# Patient Record
Sex: Female | Born: 1940 | Race: White | Hispanic: No | Marital: Married | Smoking: Never smoker
Health system: Southern US, Community
[De-identification: ages and names within clinical notes are randomized; demographics above are authoritative.]

## PROBLEM LIST (undated history)

## (undated) DIAGNOSIS — F329 Major depressive disorder, single episode, unspecified: Secondary | ICD-10-CM

## (undated) DIAGNOSIS — R42 Dizziness and giddiness: Secondary | ICD-10-CM

## (undated) DIAGNOSIS — I4891 Unspecified atrial fibrillation: Secondary | ICD-10-CM

## (undated) DIAGNOSIS — R32 Unspecified urinary incontinence: Secondary | ICD-10-CM

## (undated) DIAGNOSIS — C44301 Unspecified malignant neoplasm of skin of nose: Secondary | ICD-10-CM

## (undated) DIAGNOSIS — C50919 Malignant neoplasm of unspecified site of unspecified female breast: Secondary | ICD-10-CM

## (undated) DIAGNOSIS — S42301A Unspecified fracture of shaft of humerus, right arm, initial encounter for closed fracture: Secondary | ICD-10-CM

## (undated) DIAGNOSIS — I10 Essential (primary) hypertension: Secondary | ICD-10-CM

## (undated) DIAGNOSIS — F32A Depression, unspecified: Secondary | ICD-10-CM

## (undated) DIAGNOSIS — E785 Hyperlipidemia, unspecified: Secondary | ICD-10-CM

## (undated) HISTORY — PX: BREAST BIOPSY: SHX20

## (undated) HISTORY — DX: Malignant neoplasm of unspecified site of unspecified female breast: C50.919

## (undated) HISTORY — DX: Major depressive disorder, single episode, unspecified: F32.9

## (undated) HISTORY — DX: Essential (primary) hypertension: I10

## (undated) HISTORY — PX: BREAST LUMPECTOMY: SHX2

## (undated) HISTORY — PX: RENAL ARTERY STENT: SHX2321

## (undated) HISTORY — DX: Unspecified malignant neoplasm of skin of nose: C44.301

## (undated) HISTORY — DX: Dizziness and giddiness: R42

## (undated) HISTORY — DX: Unspecified urinary incontinence: R32

## (undated) HISTORY — DX: Depression, unspecified: F32.A

## (undated) HISTORY — PX: FEMORAL ARTERY STENT: SHX1583

## (undated) HISTORY — DX: Unspecified atrial fibrillation: I48.91

## (undated) HISTORY — DX: Hyperlipidemia, unspecified: E78.5

## (undated) HISTORY — DX: Unspecified fracture of shaft of humerus, right arm, initial encounter for closed fracture: S42.301A

---

## 2013-06-11 DIAGNOSIS — Z7901 Long term (current) use of anticoagulants: Secondary | ICD-10-CM | POA: Diagnosis not present

## 2013-06-11 DIAGNOSIS — I4891 Unspecified atrial fibrillation: Secondary | ICD-10-CM | POA: Diagnosis not present

## 2013-07-26 DIAGNOSIS — I4891 Unspecified atrial fibrillation: Secondary | ICD-10-CM | POA: Diagnosis not present

## 2013-07-26 DIAGNOSIS — Z7901 Long term (current) use of anticoagulants: Secondary | ICD-10-CM | POA: Diagnosis not present

## 2013-08-11 DIAGNOSIS — I4891 Unspecified atrial fibrillation: Secondary | ICD-10-CM | POA: Diagnosis not present

## 2013-08-11 DIAGNOSIS — Z7901 Long term (current) use of anticoagulants: Secondary | ICD-10-CM | POA: Diagnosis not present

## 2013-09-22 DIAGNOSIS — I4891 Unspecified atrial fibrillation: Secondary | ICD-10-CM | POA: Diagnosis not present

## 2013-09-22 DIAGNOSIS — Z7901 Long term (current) use of anticoagulants: Secondary | ICD-10-CM | POA: Diagnosis not present

## 2013-10-05 DIAGNOSIS — I4891 Unspecified atrial fibrillation: Secondary | ICD-10-CM | POA: Diagnosis not present

## 2013-10-05 DIAGNOSIS — Z7901 Long term (current) use of anticoagulants: Secondary | ICD-10-CM | POA: Diagnosis not present

## 2013-10-13 DIAGNOSIS — Z7901 Long term (current) use of anticoagulants: Secondary | ICD-10-CM | POA: Diagnosis not present

## 2013-10-13 DIAGNOSIS — I4891 Unspecified atrial fibrillation: Secondary | ICD-10-CM | POA: Diagnosis not present

## 2013-10-27 DIAGNOSIS — I4891 Unspecified atrial fibrillation: Secondary | ICD-10-CM | POA: Diagnosis not present

## 2013-10-27 DIAGNOSIS — Z7901 Long term (current) use of anticoagulants: Secondary | ICD-10-CM | POA: Diagnosis not present

## 2013-11-04 DIAGNOSIS — Z85828 Personal history of other malignant neoplasm of skin: Secondary | ICD-10-CM | POA: Diagnosis not present

## 2013-11-04 DIAGNOSIS — L570 Actinic keratosis: Secondary | ICD-10-CM | POA: Diagnosis not present

## 2013-11-04 DIAGNOSIS — L819 Disorder of pigmentation, unspecified: Secondary | ICD-10-CM | POA: Diagnosis not present

## 2013-12-07 DIAGNOSIS — Z7901 Long term (current) use of anticoagulants: Secondary | ICD-10-CM | POA: Diagnosis not present

## 2013-12-07 DIAGNOSIS — I4891 Unspecified atrial fibrillation: Secondary | ICD-10-CM | POA: Diagnosis not present

## 2013-12-14 DIAGNOSIS — I4891 Unspecified atrial fibrillation: Secondary | ICD-10-CM | POA: Diagnosis not present

## 2013-12-14 DIAGNOSIS — Z7901 Long term (current) use of anticoagulants: Secondary | ICD-10-CM | POA: Diagnosis not present

## 2013-12-21 DIAGNOSIS — N6489 Other specified disorders of breast: Secondary | ICD-10-CM | POA: Diagnosis not present

## 2013-12-21 DIAGNOSIS — Z853 Personal history of malignant neoplasm of breast: Secondary | ICD-10-CM | POA: Diagnosis not present

## 2013-12-21 DIAGNOSIS — R928 Other abnormal and inconclusive findings on diagnostic imaging of breast: Secondary | ICD-10-CM | POA: Diagnosis not present

## 2013-12-29 DIAGNOSIS — Z7901 Long term (current) use of anticoagulants: Secondary | ICD-10-CM | POA: Diagnosis not present

## 2013-12-29 DIAGNOSIS — I4891 Unspecified atrial fibrillation: Secondary | ICD-10-CM | POA: Diagnosis not present

## 2014-01-06 DIAGNOSIS — I4891 Unspecified atrial fibrillation: Secondary | ICD-10-CM | POA: Diagnosis not present

## 2014-01-06 DIAGNOSIS — Z7901 Long term (current) use of anticoagulants: Secondary | ICD-10-CM | POA: Diagnosis not present

## 2014-01-19 DIAGNOSIS — Z7901 Long term (current) use of anticoagulants: Secondary | ICD-10-CM | POA: Diagnosis not present

## 2014-01-19 DIAGNOSIS — I4891 Unspecified atrial fibrillation: Secondary | ICD-10-CM | POA: Diagnosis not present

## 2014-02-03 DIAGNOSIS — I10 Essential (primary) hypertension: Secondary | ICD-10-CM | POA: Diagnosis not present

## 2014-02-03 DIAGNOSIS — I739 Peripheral vascular disease, unspecified: Secondary | ICD-10-CM | POA: Diagnosis not present

## 2014-02-03 DIAGNOSIS — E785 Hyperlipidemia, unspecified: Secondary | ICD-10-CM | POA: Diagnosis not present

## 2014-02-04 DIAGNOSIS — E785 Hyperlipidemia, unspecified: Secondary | ICD-10-CM | POA: Diagnosis not present

## 2014-02-04 DIAGNOSIS — I10 Essential (primary) hypertension: Secondary | ICD-10-CM | POA: Diagnosis not present

## 2014-02-10 DIAGNOSIS — I10 Essential (primary) hypertension: Secondary | ICD-10-CM | POA: Diagnosis not present

## 2014-02-17 DIAGNOSIS — M47817 Spondylosis without myelopathy or radiculopathy, lumbosacral region: Secondary | ICD-10-CM | POA: Diagnosis not present

## 2014-02-17 DIAGNOSIS — M545 Low back pain, unspecified: Secondary | ICD-10-CM | POA: Diagnosis not present

## 2014-02-17 DIAGNOSIS — I739 Peripheral vascular disease, unspecified: Secondary | ICD-10-CM | POA: Diagnosis not present

## 2014-02-22 DIAGNOSIS — C44711 Basal cell carcinoma of skin of unspecified lower limb, including hip: Secondary | ICD-10-CM | POA: Diagnosis not present

## 2014-02-23 DIAGNOSIS — Z23 Encounter for immunization: Secondary | ICD-10-CM | POA: Diagnosis not present

## 2014-02-24 DIAGNOSIS — I4891 Unspecified atrial fibrillation: Secondary | ICD-10-CM | POA: Diagnosis not present

## 2014-02-24 DIAGNOSIS — Z7901 Long term (current) use of anticoagulants: Secondary | ICD-10-CM | POA: Diagnosis not present

## 2014-02-25 DIAGNOSIS — I739 Peripheral vascular disease, unspecified: Secondary | ICD-10-CM | POA: Diagnosis not present

## 2014-02-25 DIAGNOSIS — I4891 Unspecified atrial fibrillation: Secondary | ICD-10-CM | POA: Diagnosis not present

## 2014-02-25 DIAGNOSIS — E785 Hyperlipidemia, unspecified: Secondary | ICD-10-CM | POA: Diagnosis not present

## 2014-02-25 DIAGNOSIS — R079 Chest pain, unspecified: Secondary | ICD-10-CM | POA: Diagnosis not present

## 2014-02-28 DIAGNOSIS — I4891 Unspecified atrial fibrillation: Secondary | ICD-10-CM | POA: Diagnosis not present

## 2014-02-28 DIAGNOSIS — R079 Chest pain, unspecified: Secondary | ICD-10-CM | POA: Diagnosis not present

## 2014-02-28 DIAGNOSIS — E785 Hyperlipidemia, unspecified: Secondary | ICD-10-CM | POA: Diagnosis not present

## 2014-03-03 DIAGNOSIS — Z7901 Long term (current) use of anticoagulants: Secondary | ICD-10-CM | POA: Diagnosis not present

## 2014-03-03 DIAGNOSIS — I4891 Unspecified atrial fibrillation: Secondary | ICD-10-CM | POA: Diagnosis not present

## 2014-03-08 DIAGNOSIS — I70213 Atherosclerosis of native arteries of extremities with intermittent claudication, bilateral legs: Secondary | ICD-10-CM | POA: Diagnosis not present

## 2014-03-09 DIAGNOSIS — I4891 Unspecified atrial fibrillation: Secondary | ICD-10-CM | POA: Diagnosis not present

## 2014-03-10 DIAGNOSIS — R0989 Other specified symptoms and signs involving the circulatory and respiratory systems: Secondary | ICD-10-CM | POA: Diagnosis not present

## 2014-03-11 DIAGNOSIS — Z7901 Long term (current) use of anticoagulants: Secondary | ICD-10-CM | POA: Diagnosis not present

## 2014-03-11 DIAGNOSIS — L03116 Cellulitis of left lower limb: Secondary | ICD-10-CM | POA: Diagnosis not present

## 2014-03-11 DIAGNOSIS — I4891 Unspecified atrial fibrillation: Secondary | ICD-10-CM | POA: Diagnosis not present

## 2014-03-17 DIAGNOSIS — R079 Chest pain, unspecified: Secondary | ICD-10-CM | POA: Diagnosis not present

## 2014-03-17 DIAGNOSIS — I10 Essential (primary) hypertension: Secondary | ICD-10-CM | POA: Diagnosis not present

## 2014-03-17 DIAGNOSIS — E785 Hyperlipidemia, unspecified: Secondary | ICD-10-CM | POA: Diagnosis not present

## 2014-03-21 DIAGNOSIS — Z01812 Encounter for preprocedural laboratory examination: Secondary | ICD-10-CM | POA: Diagnosis not present

## 2014-03-21 DIAGNOSIS — Z7901 Long term (current) use of anticoagulants: Secondary | ICD-10-CM | POA: Diagnosis not present

## 2014-03-21 DIAGNOSIS — Z7982 Long term (current) use of aspirin: Secondary | ICD-10-CM | POA: Diagnosis not present

## 2014-03-21 DIAGNOSIS — I70213 Atherosclerosis of native arteries of extremities with intermittent claudication, bilateral legs: Secondary | ICD-10-CM | POA: Diagnosis not present

## 2014-03-23 DIAGNOSIS — I70213 Atherosclerosis of native arteries of extremities with intermittent claudication, bilateral legs: Secondary | ICD-10-CM | POA: Diagnosis not present

## 2014-04-05 DIAGNOSIS — I70211 Atherosclerosis of native arteries of extremities with intermittent claudication, right leg: Secondary | ICD-10-CM | POA: Diagnosis not present

## 2014-04-05 DIAGNOSIS — Z7722 Contact with and (suspected) exposure to environmental tobacco smoke (acute) (chronic): Secondary | ICD-10-CM | POA: Diagnosis not present

## 2014-04-05 DIAGNOSIS — Z7901 Long term (current) use of anticoagulants: Secondary | ICD-10-CM | POA: Diagnosis not present

## 2014-04-05 DIAGNOSIS — I70213 Atherosclerosis of native arteries of extremities with intermittent claudication, bilateral legs: Secondary | ICD-10-CM | POA: Diagnosis not present

## 2014-05-05 DIAGNOSIS — Z7982 Long term (current) use of aspirin: Secondary | ICD-10-CM | POA: Diagnosis not present

## 2014-05-05 DIAGNOSIS — Z01812 Encounter for preprocedural laboratory examination: Secondary | ICD-10-CM | POA: Diagnosis not present

## 2014-05-05 DIAGNOSIS — Z7901 Long term (current) use of anticoagulants: Secondary | ICD-10-CM | POA: Diagnosis not present

## 2014-05-11 DIAGNOSIS — I701 Atherosclerosis of renal artery: Secondary | ICD-10-CM | POA: Diagnosis not present

## 2014-05-16 DIAGNOSIS — I701 Atherosclerosis of renal artery: Secondary | ICD-10-CM | POA: Diagnosis not present

## 2014-06-01 DIAGNOSIS — E785 Hyperlipidemia, unspecified: Secondary | ICD-10-CM | POA: Diagnosis not present

## 2014-06-01 DIAGNOSIS — I10 Essential (primary) hypertension: Secondary | ICD-10-CM | POA: Diagnosis not present

## 2014-06-03 DIAGNOSIS — S42301A Unspecified fracture of shaft of humerus, right arm, initial encounter for closed fracture: Secondary | ICD-10-CM

## 2014-06-03 HISTORY — DX: Unspecified fracture of shaft of humerus, right arm, initial encounter for closed fracture: S42.301A

## 2014-06-09 DIAGNOSIS — I739 Peripheral vascular disease, unspecified: Secondary | ICD-10-CM | POA: Diagnosis not present

## 2014-06-09 DIAGNOSIS — I4891 Unspecified atrial fibrillation: Secondary | ICD-10-CM | POA: Diagnosis not present

## 2014-06-09 DIAGNOSIS — Z7901 Long term (current) use of anticoagulants: Secondary | ICD-10-CM | POA: Diagnosis not present

## 2014-06-09 DIAGNOSIS — E782 Mixed hyperlipidemia: Secondary | ICD-10-CM | POA: Diagnosis not present

## 2014-06-09 DIAGNOSIS — I10 Essential (primary) hypertension: Secondary | ICD-10-CM | POA: Diagnosis not present

## 2014-06-13 DIAGNOSIS — Z7901 Long term (current) use of anticoagulants: Secondary | ICD-10-CM | POA: Diagnosis not present

## 2014-06-13 DIAGNOSIS — I4891 Unspecified atrial fibrillation: Secondary | ICD-10-CM | POA: Diagnosis not present

## 2014-06-15 DIAGNOSIS — J209 Acute bronchitis, unspecified: Secondary | ICD-10-CM | POA: Diagnosis not present

## 2014-06-15 DIAGNOSIS — J0100 Acute maxillary sinusitis, unspecified: Secondary | ICD-10-CM | POA: Diagnosis not present

## 2014-06-17 DIAGNOSIS — Z7901 Long term (current) use of anticoagulants: Secondary | ICD-10-CM | POA: Diagnosis not present

## 2014-06-17 DIAGNOSIS — I70203 Unspecified atherosclerosis of native arteries of extremities, bilateral legs: Secondary | ICD-10-CM | POA: Diagnosis not present

## 2014-06-17 DIAGNOSIS — Z7982 Long term (current) use of aspirin: Secondary | ICD-10-CM | POA: Diagnosis not present

## 2014-06-17 DIAGNOSIS — Z0182 Encounter for allergy testing: Secondary | ICD-10-CM | POA: Diagnosis not present

## 2014-06-22 DIAGNOSIS — I70203 Unspecified atherosclerosis of native arteries of extremities, bilateral legs: Secondary | ICD-10-CM | POA: Diagnosis not present

## 2014-06-22 DIAGNOSIS — I701 Atherosclerosis of renal artery: Secondary | ICD-10-CM | POA: Diagnosis not present

## 2014-06-29 DIAGNOSIS — R5383 Other fatigue: Secondary | ICD-10-CM | POA: Diagnosis not present

## 2014-06-29 DIAGNOSIS — M545 Low back pain: Secondary | ICD-10-CM | POA: Diagnosis not present

## 2014-06-29 DIAGNOSIS — N390 Urinary tract infection, site not specified: Secondary | ICD-10-CM | POA: Diagnosis not present

## 2014-06-29 DIAGNOSIS — Z6822 Body mass index (BMI) 22.0-22.9, adult: Secondary | ICD-10-CM | POA: Diagnosis not present

## 2014-06-29 DIAGNOSIS — E782 Mixed hyperlipidemia: Secondary | ICD-10-CM | POA: Diagnosis not present

## 2014-06-29 DIAGNOSIS — R030 Elevated blood-pressure reading, without diagnosis of hypertension: Secondary | ICD-10-CM | POA: Diagnosis not present

## 2014-06-29 DIAGNOSIS — R809 Proteinuria, unspecified: Secondary | ICD-10-CM | POA: Diagnosis not present

## 2014-06-29 DIAGNOSIS — Z7901 Long term (current) use of anticoagulants: Secondary | ICD-10-CM | POA: Diagnosis not present

## 2014-07-01 DIAGNOSIS — Z7901 Long term (current) use of anticoagulants: Secondary | ICD-10-CM | POA: Diagnosis not present

## 2014-07-01 DIAGNOSIS — I10 Essential (primary) hypertension: Secondary | ICD-10-CM | POA: Diagnosis not present

## 2014-07-01 DIAGNOSIS — Z043 Encounter for examination and observation following other accident: Secondary | ICD-10-CM | POA: Diagnosis not present

## 2014-07-01 DIAGNOSIS — R42 Dizziness and giddiness: Secondary | ICD-10-CM | POA: Diagnosis not present

## 2014-07-01 DIAGNOSIS — R5381 Other malaise: Secondary | ICD-10-CM | POA: Diagnosis not present

## 2014-07-01 DIAGNOSIS — B349 Viral infection, unspecified: Secondary | ICD-10-CM | POA: Diagnosis not present

## 2014-07-01 DIAGNOSIS — E785 Hyperlipidemia, unspecified: Secondary | ICD-10-CM | POA: Diagnosis not present

## 2014-07-01 DIAGNOSIS — Z79899 Other long term (current) drug therapy: Secondary | ICD-10-CM | POA: Diagnosis not present

## 2014-07-01 DIAGNOSIS — I4891 Unspecified atrial fibrillation: Secondary | ICD-10-CM | POA: Diagnosis not present

## 2014-07-01 DIAGNOSIS — R05 Cough: Secondary | ICD-10-CM | POA: Diagnosis not present

## 2014-07-01 DIAGNOSIS — E860 Dehydration: Secondary | ICD-10-CM | POA: Diagnosis not present

## 2014-07-01 DIAGNOSIS — R112 Nausea with vomiting, unspecified: Secondary | ICD-10-CM | POA: Diagnosis not present

## 2014-07-01 DIAGNOSIS — R410 Disorientation, unspecified: Secondary | ICD-10-CM | POA: Diagnosis not present

## 2014-07-04 DIAGNOSIS — E785 Hyperlipidemia, unspecified: Secondary | ICD-10-CM | POA: Diagnosis not present

## 2014-07-04 DIAGNOSIS — E860 Dehydration: Secondary | ICD-10-CM | POA: Diagnosis not present

## 2014-07-04 DIAGNOSIS — I10 Essential (primary) hypertension: Secondary | ICD-10-CM | POA: Diagnosis not present

## 2014-07-04 DIAGNOSIS — Z7901 Long term (current) use of anticoagulants: Secondary | ICD-10-CM | POA: Diagnosis not present

## 2014-07-04 DIAGNOSIS — I739 Peripheral vascular disease, unspecified: Secondary | ICD-10-CM | POA: Diagnosis not present

## 2014-07-04 DIAGNOSIS — J029 Acute pharyngitis, unspecified: Secondary | ICD-10-CM | POA: Diagnosis not present

## 2014-07-04 DIAGNOSIS — E876 Hypokalemia: Secondary | ICD-10-CM | POA: Diagnosis not present

## 2014-07-04 DIAGNOSIS — Z923 Personal history of irradiation: Secondary | ICD-10-CM | POA: Diagnosis not present

## 2014-07-04 DIAGNOSIS — Z803 Family history of malignant neoplasm of breast: Secondary | ICD-10-CM | POA: Diagnosis not present

## 2014-07-04 DIAGNOSIS — Z853 Personal history of malignant neoplasm of breast: Secondary | ICD-10-CM | POA: Diagnosis not present

## 2014-07-04 DIAGNOSIS — Z79899 Other long term (current) drug therapy: Secondary | ICD-10-CM | POA: Diagnosis not present

## 2014-07-04 DIAGNOSIS — I482 Chronic atrial fibrillation: Secondary | ICD-10-CM | POA: Diagnosis not present

## 2014-07-04 DIAGNOSIS — R5383 Other fatigue: Secondary | ICD-10-CM | POA: Diagnosis not present

## 2014-07-04 DIAGNOSIS — Z8249 Family history of ischemic heart disease and other diseases of the circulatory system: Secondary | ICD-10-CM | POA: Diagnosis not present

## 2014-07-04 DIAGNOSIS — R531 Weakness: Secondary | ICD-10-CM | POA: Diagnosis not present

## 2014-07-04 DIAGNOSIS — I4891 Unspecified atrial fibrillation: Secondary | ICD-10-CM | POA: Diagnosis not present

## 2014-07-05 DIAGNOSIS — E860 Dehydration: Secondary | ICD-10-CM | POA: Diagnosis not present

## 2014-07-06 DIAGNOSIS — E876 Hypokalemia: Secondary | ICD-10-CM | POA: Diagnosis not present

## 2014-07-06 DIAGNOSIS — I4891 Unspecified atrial fibrillation: Secondary | ICD-10-CM | POA: Diagnosis not present

## 2014-07-06 DIAGNOSIS — E860 Dehydration: Secondary | ICD-10-CM | POA: Diagnosis not present

## 2014-07-07 DIAGNOSIS — E860 Dehydration: Secondary | ICD-10-CM | POA: Diagnosis not present

## 2014-07-09 DIAGNOSIS — Z7901 Long term (current) use of anticoagulants: Secondary | ICD-10-CM | POA: Diagnosis not present

## 2014-07-09 DIAGNOSIS — Z86718 Personal history of other venous thrombosis and embolism: Secondary | ICD-10-CM | POA: Diagnosis not present

## 2014-07-09 DIAGNOSIS — Z5181 Encounter for therapeutic drug level monitoring: Secondary | ICD-10-CM | POA: Diagnosis not present

## 2014-07-09 DIAGNOSIS — I10 Essential (primary) hypertension: Secondary | ICD-10-CM | POA: Diagnosis not present

## 2014-07-09 DIAGNOSIS — I70203 Unspecified atherosclerosis of native arteries of extremities, bilateral legs: Secondary | ICD-10-CM | POA: Diagnosis not present

## 2014-07-09 DIAGNOSIS — I4891 Unspecified atrial fibrillation: Secondary | ICD-10-CM | POA: Diagnosis not present

## 2014-07-09 DIAGNOSIS — R5383 Other fatigue: Secondary | ICD-10-CM | POA: Diagnosis not present

## 2014-07-11 DIAGNOSIS — I4891 Unspecified atrial fibrillation: Secondary | ICD-10-CM | POA: Diagnosis not present

## 2014-07-11 DIAGNOSIS — I70203 Unspecified atherosclerosis of native arteries of extremities, bilateral legs: Secondary | ICD-10-CM | POA: Diagnosis not present

## 2014-07-11 DIAGNOSIS — R5383 Other fatigue: Secondary | ICD-10-CM | POA: Diagnosis not present

## 2014-07-11 DIAGNOSIS — I10 Essential (primary) hypertension: Secondary | ICD-10-CM | POA: Diagnosis not present

## 2014-07-11 DIAGNOSIS — Z5181 Encounter for therapeutic drug level monitoring: Secondary | ICD-10-CM | POA: Diagnosis not present

## 2014-07-11 DIAGNOSIS — Z86718 Personal history of other venous thrombosis and embolism: Secondary | ICD-10-CM | POA: Diagnosis not present

## 2014-07-14 DIAGNOSIS — R5383 Other fatigue: Secondary | ICD-10-CM | POA: Diagnosis not present

## 2014-07-14 DIAGNOSIS — K5900 Constipation, unspecified: Secondary | ICD-10-CM | POA: Diagnosis not present

## 2014-07-14 DIAGNOSIS — I10 Essential (primary) hypertension: Secondary | ICD-10-CM | POA: Diagnosis not present

## 2014-07-14 DIAGNOSIS — E786 Lipoprotein deficiency: Secondary | ICD-10-CM | POA: Diagnosis not present

## 2014-07-18 DIAGNOSIS — I10 Essential (primary) hypertension: Secondary | ICD-10-CM | POA: Diagnosis not present

## 2014-07-18 DIAGNOSIS — I70203 Unspecified atherosclerosis of native arteries of extremities, bilateral legs: Secondary | ICD-10-CM | POA: Diagnosis not present

## 2014-07-18 DIAGNOSIS — I4891 Unspecified atrial fibrillation: Secondary | ICD-10-CM | POA: Diagnosis not present

## 2014-07-18 DIAGNOSIS — R5383 Other fatigue: Secondary | ICD-10-CM | POA: Diagnosis not present

## 2014-07-18 DIAGNOSIS — Z86718 Personal history of other venous thrombosis and embolism: Secondary | ICD-10-CM | POA: Diagnosis not present

## 2014-07-18 DIAGNOSIS — Z5181 Encounter for therapeutic drug level monitoring: Secondary | ICD-10-CM | POA: Diagnosis not present

## 2014-07-21 DIAGNOSIS — E782 Mixed hyperlipidemia: Secondary | ICD-10-CM | POA: Diagnosis not present

## 2014-07-21 DIAGNOSIS — I4891 Unspecified atrial fibrillation: Secondary | ICD-10-CM | POA: Diagnosis not present

## 2014-07-21 DIAGNOSIS — Z853 Personal history of malignant neoplasm of breast: Secondary | ICD-10-CM | POA: Diagnosis not present

## 2014-07-21 DIAGNOSIS — I499 Cardiac arrhythmia, unspecified: Secondary | ICD-10-CM | POA: Diagnosis not present

## 2014-07-21 DIAGNOSIS — Z780 Asymptomatic menopausal state: Secondary | ICD-10-CM | POA: Diagnosis not present

## 2014-07-21 DIAGNOSIS — R42 Dizziness and giddiness: Secondary | ICD-10-CM | POA: Diagnosis not present

## 2014-07-21 DIAGNOSIS — Z79899 Other long term (current) drug therapy: Secondary | ICD-10-CM | POA: Diagnosis not present

## 2014-07-21 DIAGNOSIS — E876 Hypokalemia: Secondary | ICD-10-CM | POA: Diagnosis not present

## 2014-07-21 DIAGNOSIS — Z7901 Long term (current) use of anticoagulants: Secondary | ICD-10-CM | POA: Diagnosis not present

## 2014-07-21 DIAGNOSIS — E785 Hyperlipidemia, unspecified: Secondary | ICD-10-CM | POA: Diagnosis not present

## 2014-07-21 DIAGNOSIS — I10 Essential (primary) hypertension: Secondary | ICD-10-CM | POA: Diagnosis not present

## 2014-07-25 DIAGNOSIS — R5383 Other fatigue: Secondary | ICD-10-CM | POA: Diagnosis not present

## 2014-07-25 DIAGNOSIS — I4891 Unspecified atrial fibrillation: Secondary | ICD-10-CM | POA: Diagnosis not present

## 2014-07-25 DIAGNOSIS — I70203 Unspecified atherosclerosis of native arteries of extremities, bilateral legs: Secondary | ICD-10-CM | POA: Diagnosis not present

## 2014-07-25 DIAGNOSIS — Z86718 Personal history of other venous thrombosis and embolism: Secondary | ICD-10-CM | POA: Diagnosis not present

## 2014-07-25 DIAGNOSIS — Z5181 Encounter for therapeutic drug level monitoring: Secondary | ICD-10-CM | POA: Diagnosis not present

## 2014-07-25 DIAGNOSIS — E876 Hypokalemia: Secondary | ICD-10-CM | POA: Diagnosis not present

## 2014-07-25 DIAGNOSIS — M353 Polymyalgia rheumatica: Secondary | ICD-10-CM | POA: Diagnosis not present

## 2014-07-25 DIAGNOSIS — I10 Essential (primary) hypertension: Secondary | ICD-10-CM | POA: Diagnosis not present

## 2014-07-26 DIAGNOSIS — E876 Hypokalemia: Secondary | ICD-10-CM | POA: Diagnosis not present

## 2014-07-26 DIAGNOSIS — M353 Polymyalgia rheumatica: Secondary | ICD-10-CM | POA: Diagnosis not present

## 2014-07-28 DIAGNOSIS — E876 Hypokalemia: Secondary | ICD-10-CM | POA: Diagnosis not present

## 2014-07-29 DIAGNOSIS — R700 Elevated erythrocyte sedimentation rate: Secondary | ICD-10-CM | POA: Diagnosis not present

## 2014-07-29 DIAGNOSIS — R5381 Other malaise: Secondary | ICD-10-CM | POA: Diagnosis not present

## 2014-07-29 DIAGNOSIS — M791 Myalgia: Secondary | ICD-10-CM | POA: Diagnosis not present

## 2014-07-29 DIAGNOSIS — R51 Headache: Secondary | ICD-10-CM | POA: Diagnosis not present

## 2014-08-01 DIAGNOSIS — M2550 Pain in unspecified joint: Secondary | ICD-10-CM | POA: Diagnosis not present

## 2014-08-01 DIAGNOSIS — E876 Hypokalemia: Secondary | ICD-10-CM | POA: Diagnosis not present

## 2014-08-01 DIAGNOSIS — E8809 Other disorders of plasma-protein metabolism, not elsewhere classified: Secondary | ICD-10-CM | POA: Diagnosis not present

## 2014-08-04 DIAGNOSIS — N183 Chronic kidney disease, stage 3 (moderate): Secondary | ICD-10-CM | POA: Diagnosis not present

## 2014-08-04 DIAGNOSIS — I10 Essential (primary) hypertension: Secondary | ICD-10-CM | POA: Diagnosis not present

## 2014-08-04 DIAGNOSIS — I701 Atherosclerosis of renal artery: Secondary | ICD-10-CM | POA: Diagnosis not present

## 2014-08-04 DIAGNOSIS — E876 Hypokalemia: Secondary | ICD-10-CM | POA: Diagnosis not present

## 2014-08-04 DIAGNOSIS — M6289 Other specified disorders of muscle: Secondary | ICD-10-CM | POA: Diagnosis not present

## 2014-08-05 DIAGNOSIS — E876 Hypokalemia: Secondary | ICD-10-CM | POA: Diagnosis not present

## 2014-08-08 DIAGNOSIS — Z86718 Personal history of other venous thrombosis and embolism: Secondary | ICD-10-CM | POA: Diagnosis not present

## 2014-08-08 DIAGNOSIS — I10 Essential (primary) hypertension: Secondary | ICD-10-CM | POA: Diagnosis not present

## 2014-08-08 DIAGNOSIS — I70203 Unspecified atherosclerosis of native arteries of extremities, bilateral legs: Secondary | ICD-10-CM | POA: Diagnosis not present

## 2014-08-08 DIAGNOSIS — R5383 Other fatigue: Secondary | ICD-10-CM | POA: Diagnosis not present

## 2014-08-08 DIAGNOSIS — I4891 Unspecified atrial fibrillation: Secondary | ICD-10-CM | POA: Diagnosis not present

## 2014-08-08 DIAGNOSIS — Z5181 Encounter for therapeutic drug level monitoring: Secondary | ICD-10-CM | POA: Diagnosis not present

## 2014-08-08 DIAGNOSIS — E876 Hypokalemia: Secondary | ICD-10-CM | POA: Diagnosis not present

## 2014-08-11 DIAGNOSIS — I10 Essential (primary) hypertension: Secondary | ICD-10-CM | POA: Diagnosis not present

## 2014-08-11 DIAGNOSIS — I701 Atherosclerosis of renal artery: Secondary | ICD-10-CM | POA: Diagnosis not present

## 2014-08-11 DIAGNOSIS — N183 Chronic kidney disease, stage 3 (moderate): Secondary | ICD-10-CM | POA: Diagnosis not present

## 2014-08-11 DIAGNOSIS — M6289 Other specified disorders of muscle: Secondary | ICD-10-CM | POA: Diagnosis not present

## 2014-08-11 DIAGNOSIS — E876 Hypokalemia: Secondary | ICD-10-CM | POA: Diagnosis not present

## 2014-08-15 DIAGNOSIS — Z86718 Personal history of other venous thrombosis and embolism: Secondary | ICD-10-CM | POA: Diagnosis not present

## 2014-08-15 DIAGNOSIS — R5383 Other fatigue: Secondary | ICD-10-CM | POA: Diagnosis not present

## 2014-08-15 DIAGNOSIS — I10 Essential (primary) hypertension: Secondary | ICD-10-CM | POA: Diagnosis not present

## 2014-08-15 DIAGNOSIS — Z5181 Encounter for therapeutic drug level monitoring: Secondary | ICD-10-CM | POA: Diagnosis not present

## 2014-08-15 DIAGNOSIS — I70203 Unspecified atherosclerosis of native arteries of extremities, bilateral legs: Secondary | ICD-10-CM | POA: Diagnosis not present

## 2014-08-15 DIAGNOSIS — I4891 Unspecified atrial fibrillation: Secondary | ICD-10-CM | POA: Diagnosis not present

## 2014-08-22 DIAGNOSIS — I4891 Unspecified atrial fibrillation: Secondary | ICD-10-CM | POA: Diagnosis not present

## 2014-08-22 DIAGNOSIS — Z86718 Personal history of other venous thrombosis and embolism: Secondary | ICD-10-CM | POA: Diagnosis not present

## 2014-08-22 DIAGNOSIS — I70203 Unspecified atherosclerosis of native arteries of extremities, bilateral legs: Secondary | ICD-10-CM | POA: Diagnosis not present

## 2014-08-22 DIAGNOSIS — Z5181 Encounter for therapeutic drug level monitoring: Secondary | ICD-10-CM | POA: Diagnosis not present

## 2014-08-22 DIAGNOSIS — I10 Essential (primary) hypertension: Secondary | ICD-10-CM | POA: Diagnosis not present

## 2014-08-22 DIAGNOSIS — R5383 Other fatigue: Secondary | ICD-10-CM | POA: Diagnosis not present

## 2014-08-25 DIAGNOSIS — E876 Hypokalemia: Secondary | ICD-10-CM | POA: Diagnosis not present

## 2014-08-25 DIAGNOSIS — I10 Essential (primary) hypertension: Secondary | ICD-10-CM | POA: Diagnosis not present

## 2014-08-25 DIAGNOSIS — N183 Chronic kidney disease, stage 3 (moderate): Secondary | ICD-10-CM | POA: Diagnosis not present

## 2014-08-25 DIAGNOSIS — M6289 Other specified disorders of muscle: Secondary | ICD-10-CM | POA: Diagnosis not present

## 2014-08-25 DIAGNOSIS — I701 Atherosclerosis of renal artery: Secondary | ICD-10-CM | POA: Diagnosis not present

## 2014-08-29 DIAGNOSIS — Z5181 Encounter for therapeutic drug level monitoring: Secondary | ICD-10-CM | POA: Diagnosis not present

## 2014-08-29 DIAGNOSIS — R5383 Other fatigue: Secondary | ICD-10-CM | POA: Diagnosis not present

## 2014-08-29 DIAGNOSIS — I10 Essential (primary) hypertension: Secondary | ICD-10-CM | POA: Diagnosis not present

## 2014-08-29 DIAGNOSIS — Z86718 Personal history of other venous thrombosis and embolism: Secondary | ICD-10-CM | POA: Diagnosis not present

## 2014-08-29 DIAGNOSIS — I4891 Unspecified atrial fibrillation: Secondary | ICD-10-CM | POA: Diagnosis not present

## 2014-08-29 DIAGNOSIS — I70203 Unspecified atherosclerosis of native arteries of extremities, bilateral legs: Secondary | ICD-10-CM | POA: Diagnosis not present

## 2014-09-05 DIAGNOSIS — I4891 Unspecified atrial fibrillation: Secondary | ICD-10-CM | POA: Diagnosis not present

## 2014-09-05 DIAGNOSIS — R5383 Other fatigue: Secondary | ICD-10-CM | POA: Diagnosis not present

## 2014-09-05 DIAGNOSIS — I70203 Unspecified atherosclerosis of native arteries of extremities, bilateral legs: Secondary | ICD-10-CM | POA: Diagnosis not present

## 2014-09-05 DIAGNOSIS — I10 Essential (primary) hypertension: Secondary | ICD-10-CM | POA: Diagnosis not present

## 2014-09-05 DIAGNOSIS — Z5181 Encounter for therapeutic drug level monitoring: Secondary | ICD-10-CM | POA: Diagnosis not present

## 2014-09-05 DIAGNOSIS — Z86718 Personal history of other venous thrombosis and embolism: Secondary | ICD-10-CM | POA: Diagnosis not present

## 2014-09-12 DIAGNOSIS — I4891 Unspecified atrial fibrillation: Secondary | ICD-10-CM | POA: Diagnosis not present

## 2014-09-12 DIAGNOSIS — Z7901 Long term (current) use of anticoagulants: Secondary | ICD-10-CM | POA: Diagnosis not present

## 2014-09-19 DIAGNOSIS — Z7901 Long term (current) use of anticoagulants: Secondary | ICD-10-CM | POA: Diagnosis not present

## 2014-09-19 DIAGNOSIS — I4891 Unspecified atrial fibrillation: Secondary | ICD-10-CM | POA: Diagnosis not present

## 2014-09-22 DIAGNOSIS — E876 Hypokalemia: Secondary | ICD-10-CM | POA: Diagnosis not present

## 2014-09-22 DIAGNOSIS — N183 Chronic kidney disease, stage 3 (moderate): Secondary | ICD-10-CM | POA: Diagnosis not present

## 2014-09-22 DIAGNOSIS — I701 Atherosclerosis of renal artery: Secondary | ICD-10-CM | POA: Diagnosis not present

## 2014-09-22 DIAGNOSIS — I10 Essential (primary) hypertension: Secondary | ICD-10-CM | POA: Diagnosis not present

## 2014-09-22 DIAGNOSIS — M6289 Other specified disorders of muscle: Secondary | ICD-10-CM | POA: Diagnosis not present

## 2014-09-26 DIAGNOSIS — Z7901 Long term (current) use of anticoagulants: Secondary | ICD-10-CM | POA: Diagnosis not present

## 2014-09-26 DIAGNOSIS — I4891 Unspecified atrial fibrillation: Secondary | ICD-10-CM | POA: Diagnosis not present

## 2014-10-03 DIAGNOSIS — I4891 Unspecified atrial fibrillation: Secondary | ICD-10-CM | POA: Diagnosis not present

## 2014-10-03 DIAGNOSIS — Z7901 Long term (current) use of anticoagulants: Secondary | ICD-10-CM | POA: Diagnosis not present

## 2014-10-17 DIAGNOSIS — I4891 Unspecified atrial fibrillation: Secondary | ICD-10-CM | POA: Diagnosis not present

## 2014-10-17 DIAGNOSIS — Z7901 Long term (current) use of anticoagulants: Secondary | ICD-10-CM | POA: Diagnosis not present

## 2014-10-20 DIAGNOSIS — R5383 Other fatigue: Secondary | ICD-10-CM | POA: Diagnosis not present

## 2014-10-20 DIAGNOSIS — I480 Paroxysmal atrial fibrillation: Secondary | ICD-10-CM | POA: Diagnosis not present

## 2014-10-20 DIAGNOSIS — M6281 Muscle weakness (generalized): Secondary | ICD-10-CM | POA: Diagnosis not present

## 2014-10-20 DIAGNOSIS — M353 Polymyalgia rheumatica: Secondary | ICD-10-CM | POA: Diagnosis not present

## 2014-10-27 DIAGNOSIS — R5383 Other fatigue: Secondary | ICD-10-CM | POA: Diagnosis not present

## 2014-10-27 DIAGNOSIS — I480 Paroxysmal atrial fibrillation: Secondary | ICD-10-CM | POA: Diagnosis not present

## 2014-11-21 DIAGNOSIS — I4891 Unspecified atrial fibrillation: Secondary | ICD-10-CM | POA: Diagnosis not present

## 2014-11-21 DIAGNOSIS — Z7901 Long term (current) use of anticoagulants: Secondary | ICD-10-CM | POA: Diagnosis not present

## 2014-12-08 DIAGNOSIS — I4891 Unspecified atrial fibrillation: Secondary | ICD-10-CM | POA: Diagnosis not present

## 2014-12-08 DIAGNOSIS — I10 Essential (primary) hypertension: Secondary | ICD-10-CM | POA: Diagnosis not present

## 2014-12-08 DIAGNOSIS — E782 Mixed hyperlipidemia: Secondary | ICD-10-CM | POA: Diagnosis not present

## 2014-12-08 DIAGNOSIS — Z1231 Encounter for screening mammogram for malignant neoplasm of breast: Secondary | ICD-10-CM | POA: Diagnosis not present

## 2014-12-19 DIAGNOSIS — N183 Chronic kidney disease, stage 3 (moderate): Secondary | ICD-10-CM | POA: Diagnosis not present

## 2014-12-22 DIAGNOSIS — I701 Atherosclerosis of renal artery: Secondary | ICD-10-CM | POA: Diagnosis not present

## 2014-12-22 DIAGNOSIS — E876 Hypokalemia: Secondary | ICD-10-CM | POA: Diagnosis not present

## 2014-12-22 DIAGNOSIS — M6289 Other specified disorders of muscle: Secondary | ICD-10-CM | POA: Diagnosis not present

## 2014-12-22 DIAGNOSIS — I10 Essential (primary) hypertension: Secondary | ICD-10-CM | POA: Diagnosis not present

## 2014-12-22 DIAGNOSIS — N183 Chronic kidney disease, stage 3 (moderate): Secondary | ICD-10-CM | POA: Diagnosis not present

## 2015-01-10 DIAGNOSIS — Z7901 Long term (current) use of anticoagulants: Secondary | ICD-10-CM | POA: Diagnosis not present

## 2015-01-17 DIAGNOSIS — R251 Tremor, unspecified: Secondary | ICD-10-CM | POA: Diagnosis not present

## 2015-02-07 DIAGNOSIS — R251 Tremor, unspecified: Secondary | ICD-10-CM | POA: Diagnosis not present

## 2015-02-17 DIAGNOSIS — I10 Essential (primary) hypertension: Secondary | ICD-10-CM | POA: Diagnosis not present

## 2015-02-17 DIAGNOSIS — F419 Anxiety disorder, unspecified: Secondary | ICD-10-CM | POA: Diagnosis not present

## 2015-02-17 DIAGNOSIS — I4891 Unspecified atrial fibrillation: Secondary | ICD-10-CM | POA: Diagnosis not present

## 2015-02-17 DIAGNOSIS — G8911 Acute pain due to trauma: Secondary | ICD-10-CM | POA: Diagnosis not present

## 2015-02-17 DIAGNOSIS — S0990XA Unspecified injury of head, initial encounter: Secondary | ICD-10-CM | POA: Diagnosis not present

## 2015-02-17 DIAGNOSIS — S42211A Unspecified displaced fracture of surgical neck of right humerus, initial encounter for closed fracture: Secondary | ICD-10-CM | POA: Diagnosis not present

## 2015-02-17 DIAGNOSIS — S42201A Unspecified fracture of upper end of right humerus, initial encounter for closed fracture: Secondary | ICD-10-CM | POA: Diagnosis not present

## 2015-02-17 DIAGNOSIS — R51 Headache: Secondary | ICD-10-CM | POA: Diagnosis not present

## 2015-02-17 DIAGNOSIS — M25511 Pain in right shoulder: Secondary | ICD-10-CM | POA: Diagnosis not present

## 2015-02-17 DIAGNOSIS — M542 Cervicalgia: Secondary | ICD-10-CM | POA: Diagnosis not present

## 2015-02-17 DIAGNOSIS — S4980XA Other specified injuries of shoulder and upper arm, unspecified arm, initial encounter: Secondary | ICD-10-CM | POA: Diagnosis not present

## 2015-02-17 DIAGNOSIS — G3189 Other specified degenerative diseases of nervous system: Secondary | ICD-10-CM | POA: Diagnosis not present

## 2015-02-21 DIAGNOSIS — M25511 Pain in right shoulder: Secondary | ICD-10-CM | POA: Diagnosis not present

## 2015-02-21 DIAGNOSIS — S42321A Displaced transverse fracture of shaft of humerus, right arm, initial encounter for closed fracture: Secondary | ICD-10-CM | POA: Diagnosis not present

## 2015-02-27 DIAGNOSIS — M25521 Pain in right elbow: Secondary | ICD-10-CM | POA: Diagnosis not present

## 2015-02-27 DIAGNOSIS — S63501A Unspecified sprain of right wrist, initial encounter: Secondary | ICD-10-CM | POA: Diagnosis not present

## 2015-02-27 DIAGNOSIS — S42201A Unspecified fracture of upper end of right humerus, initial encounter for closed fracture: Secondary | ICD-10-CM | POA: Diagnosis not present

## 2015-03-03 ENCOUNTER — Encounter: Payer: Self-pay | Admitting: Family Medicine

## 2015-03-03 ENCOUNTER — Ambulatory Visit (INDEPENDENT_AMBULATORY_CARE_PROVIDER_SITE_OTHER): Payer: Medicare Other | Admitting: Family Medicine

## 2015-03-03 VITALS — BP 155/83 | HR 108 | Temp 98.3°F | Resp 18 | Ht 66.0 in | Wt 165.0 lb

## 2015-03-03 DIAGNOSIS — I480 Paroxysmal atrial fibrillation: Secondary | ICD-10-CM

## 2015-03-03 DIAGNOSIS — K649 Unspecified hemorrhoids: Secondary | ICD-10-CM

## 2015-03-03 DIAGNOSIS — R5383 Other fatigue: Secondary | ICD-10-CM

## 2015-03-03 DIAGNOSIS — Z5181 Encounter for therapeutic drug level monitoring: Secondary | ICD-10-CM | POA: Diagnosis not present

## 2015-03-03 DIAGNOSIS — S42301A Unspecified fracture of shaft of humerus, right arm, initial encounter for closed fracture: Secondary | ICD-10-CM

## 2015-03-03 DIAGNOSIS — F32A Depression, unspecified: Secondary | ICD-10-CM | POA: Insufficient documentation

## 2015-03-03 DIAGNOSIS — Z9181 History of falling: Secondary | ICD-10-CM

## 2015-03-03 DIAGNOSIS — F329 Major depressive disorder, single episode, unspecified: Secondary | ICD-10-CM

## 2015-03-03 DIAGNOSIS — Z0000 Encounter for general adult medical examination without abnormal findings: Secondary | ICD-10-CM

## 2015-03-03 DIAGNOSIS — E785 Hyperlipidemia, unspecified: Secondary | ICD-10-CM

## 2015-03-03 DIAGNOSIS — R32 Unspecified urinary incontinence: Secondary | ICD-10-CM

## 2015-03-03 DIAGNOSIS — R531 Weakness: Secondary | ICD-10-CM

## 2015-03-03 DIAGNOSIS — W19XXXA Unspecified fall, initial encounter: Secondary | ICD-10-CM

## 2015-03-03 DIAGNOSIS — I4891 Unspecified atrial fibrillation: Secondary | ICD-10-CM | POA: Insufficient documentation

## 2015-03-03 DIAGNOSIS — Z7901 Long term (current) use of anticoagulants: Secondary | ICD-10-CM

## 2015-03-03 DIAGNOSIS — K5900 Constipation, unspecified: Secondary | ICD-10-CM

## 2015-03-03 DIAGNOSIS — I10 Essential (primary) hypertension: Secondary | ICD-10-CM

## 2015-03-03 DIAGNOSIS — R682 Dry mouth, unspecified: Secondary | ICD-10-CM

## 2015-03-03 HISTORY — DX: Unspecified hemorrhoids: K64.9

## 2015-03-03 MED ORDER — POLYETHYLENE GLYCOL 3350 17 GM/SCOOP PO POWD: 17.0000 g | Freq: Every day | ORAL | Status: DC

## 2015-03-03 MED ORDER — CITALOPRAM HYDROBROMIDE 20 MG PO TABS: 20.0000 mg | ORAL_TABLET | Freq: Every day | ORAL | Status: DC

## 2015-03-03 MED ORDER — LOVASTATIN 20 MG PO TABS: 20.0000 mg | ORAL_TABLET | Freq: Every day | ORAL | Status: DC

## 2015-03-03 MED ORDER — HYDROCORTISONE 2.5 % RE CREA: 1.0000 "application " | TOPICAL_CREAM | Freq: Two times a day (BID) | RECTAL | Status: DC

## 2015-03-03 MED ORDER — HYDROCORTISONE ACETATE 25 MG RE SUPP: 25.0000 mg | Freq: Two times a day (BID) | RECTAL | Status: DC

## 2015-03-03 MED ORDER — SPIRONOLACTONE 25 MG PO TABS: 12.5000 mg | ORAL_TABLET | Freq: Every day | ORAL | Status: DC

## 2015-03-03 MED ORDER — LOSARTAN POTASSIUM 100 MG PO TABS: 100.0000 mg | ORAL_TABLET | Freq: Every day | ORAL | Status: DC

## 2015-03-03 NOTE — Patient Instructions (Signed)

## 2015-03-03 NOTE — Progress Notes (Signed)
Subjective:    Patient ID: Kathy Hobbs, female    DOB: 07-02-1940, 74 y.o.   MRN: [id number]  HPI Patient presents for new patient establishment  Multiple chronic and acute complaints , she is accompanied by her daughter today.  History is obtained from both patient and her daughter. All past medical history, surgical history, allergies, family history, immunizations and social history was obtained from the patient today and entered into the electronic medical record. Records are requested from her prior PCP, and will be reviewed at the time they are received. All medical records will be updated at that time.  Of note: patient's daughter wanted to speak with provider alone prior to mother's appointment to discuss her declined. She states that her mother downplays a lot of her medical issues and she is extremely concerned about her mother's health. She reports since her mother surgery in January , she has declined rapidly. She has multiple falls , imbalance ,  increased depression, fatigue/lack of energy per patient's daughter. This was an abrupt change after her surgery in January. Patient's daughter now has her mother living with her , patient recently moved from [place] so her daughter could assist with her care.   Increase in falls/imbalance: Patient and patient's daughter states that she is having difficulty walking since she "had her vessels cleaned out "in January. She has very little energy, increased weakness, dizziness and 3 falls this month. Patient's daughter states this was an abrupt change for her mother, and prior with fully functional. Patient fractured her arm 2 weeks ago secondary to a fall. Patient daughter states that her mother went to see a neurologist a few months ago, he ran blood work for patient's weakness, but they have not heard anything concerning the blood work and return to follow-up in 2 months. Therefore patient is unable to get out of bed with assistance. Shakes  uncontrollably. Has had urge incontinence which is new. Nocturia at least 4 times a night. Patient endorses Dry mouth. Patient had a head CT that was reported normal. Records have been requested .   Depression:  Patient reports ever since her stent placement she has had increased depression. She states the first 2 stents went smoothly, but the third stent created many problems in her health, and since then she has been increasingly depressed. She currently takes Celexa 20 mg , and reports that this medication seems to be helping her. She reports she does not feel increasingly depressed any longer.  Recent humerus fracture:  Patient has suffered from a fall, in which she states she was getting out of her friend's truck that was high off the ground and she slipped out and fell backwards. She states she hit the ground with her arm. She reports a fracture of the right humerus , and is being followed by [facility] orthopedics. Patient is in a sling today , no casting.  Patient does not take a vitamin D her calcium supplementation. She denies any other recent fractures. No history of bone density that is known. Records have been requested from all prior providers in [place].  Hyperlipidemia: patient reports history of hyperlipidemia. She had been taking lovastatin 20 mg Qhs. She tolerates medication without any negative side effects. She has been intolerant of other statins. She was taken off of the statin when her weakness started in January, however she states she doesn't believe stopping that medication made any difference in her weakness.  A.Fib:Patient has a h/o of paroxysmal atrial fibrillation.  She  is currently taking Coumadin which she states her last INR was approximately 2 weeks ago and was "low". She reports her current Coumadin dose is 1.5 mg Monday Wednesday Friday and 2 mg Tuesday  Thursday saturday Sunday. Patient is not on a rate controlling medication, no history of cardiac ablation. She is now  having increased falls and dizziness.  Hypertension: Patient has a history of hypertension, she does not exercise or watch the salt content in her diet. She does not monitor her blood pressure home. She is currently on Cozaar 100 mg daily and spironolactone 12.5 mg daily. She does not know what her goal blood pressure should be. She denies any chest pain, shortness of breath, headaches, visual changes, orthopnea or lower extremity edema.  Urine incontinence: Patient reports having new onset urinary incontinence. She states this started January after she had renal stent placements. She states that she has the urge to go to the bathroom, and can't make it to the bathroom. She has never been followed by urology. She has not had any bladder studies.  Hemorrhoids: Patient states that she has hemorrhoids that started 3 days ago and is painful. He has tried using Preparation H and wet wipes, but does not feel that it is improving. She denies any bleeding or fever. She reports she had to take pain medications for her fractured arm, which caused her to be constipated. Strain of the constipation caused her to have a hemorrhoid.  Past Medical History  Diagnosis Date  . Depression   . Hypertension   . Hyperlipidemia   . Urine incontinence   . Atrial fibrillation   . Breast cancer   . Vertigo    No Known Allergies Past Surgical History  Procedure Laterality Date  . Breast biopsy    . Breast lumpectomy    . Renal artery stent Bilateral   . Femoral artery stent      ? pt states "Stent in my leg"   Family History  Problem Relation Age of Onset  . Heart attack Father   . Alcohol abuse Brother   . Breast cancer Paternal Grandmother    Social History   Social History  . Marital Status: Unknown    Spouse Name: N/A  . Number of Children: N/A  . Years of Education: N/A   Occupational History  . Not on file.   Social History Main Topics  . Smoking status: Never Smoker   . Smokeless tobacco:  Never Used  . Alcohol Use: No  . Drug Use: No  . Sexual Activity: No   Other Topics Concern  . Not on file   Social History Narrative   Married,. Currently lives with daughter in [place] from [place] secondary to declining health.    Wears seatbelt, wears bike helmet.    - does not exercise daily.    - takes vitamins.    - Smoke alarm in the home. Guns in the home, locked case.   - feels safe in her relationships.      Review of Systems  Constitutional: Positive for activity change and fatigue. Negative for fever, chills, diaphoresis, appetite change and unexpected weight change.  HENT: Negative.  Negative for hearing loss, tinnitus, trouble swallowing and voice change.   Eyes: Negative.  Negative for photophobia and visual disturbance.  Respiratory: Negative.  Negative for chest tightness, shortness of breath and wheezing.   Cardiovascular: Negative.  Negative for chest pain, palpitations and leg swelling.  Gastrointestinal: Positive for constipation and rectal pain.  Negative for nausea, vomiting, abdominal pain, diarrhea, blood in stool, abdominal distention and anal bleeding.  Endocrine: Negative for polydipsia, polyphagia and polyuria.  Genitourinary: Positive for urgency and difficulty urinating. Negative for dysuria, frequency, hematuria, flank pain, decreased urine volume, vaginal pain and pelvic pain.  Musculoskeletal: Negative for myalgias and arthralgias.  Skin: Negative for rash and wound.  Allergic/Immunologic: Negative for environmental allergies and food allergies.  Neurological: Positive for dizziness and weakness. Negative for syncope.  Hematological: Negative for adenopathy. Does not bruise/bleed easily.  Psychiatric/Behavioral: Positive for dysphoric mood and decreased concentration. Negative for suicidal ideas, hallucinations and behavioral problems. The patient is not nervous/anxious.        Objective:   Physical Exam BP 155/83 mmHg  Pulse 108  Temp(Src)  98.3 F (36.8 C) (Oral)  Resp 18  Ht '5\' 6"'  (1.676 m)  Wt 165 lb (74.844 kg)  BMI 26.64 kg/m2  SpO2 97% Gen: Afebrile. No acute distress. Nontoxic in appearance, well-developed, well-nourished Caucasian female. Pleasant. Cooperative with exam. Makes good eye contact. HENT: AT. . MMM. Bilateral nares without erythema or swelling. Throat without erythema or exudates.  Eyes:Pupils Equal Round Reactive to light, Extraocular movements intact,  Conjunctiva without redness, discharge or icterus. Neck/lymp/endocrine: Supple, no lymphadenopathy, no thyromegaly CV: RRR no murmurs appreciated, no edema, +2/4 P posterior tibialis pulses Chest: CTAB, no wheeze or crackles Abd: Soft. Flat. NTND. BS present. No Masses palpated.  Skin: No rashes, purpura or petechiae.  Neuro/msk: Slow, short strides. PERLA. EOMi. Alert. Oriented x3.  Muscle strength 5/5 upper and lower extremities bilaterally, with the exception of very mild 4+/5 weakness toe raise left foot.  Psych: Normal affect, dress and demeanor. Normal speech. Normal thought content and judgment..     Assessment & Plan:  1. Paroxysmal atrial fibrillation - Coumadin may not be the best option for this patient, considering her increased falls. Will obtain INR, and manage Coumadin until we get full records on patient. Referral to cardiology has been placed, after she becomes established they will follow her INR. - Protime-INR; Future - Ambulatory referral to Cardiology  2. Weakness generalized - Mildly weak left lower extremity only on exam today. - Uncertain etiology of weakness, will start laboratory evaluation - CBC with Differential/Platelet; Future - Comprehensive metabolic panel; Future - Vit D  25 hydroxy (rtn osteoporosis monitoring); Future - TSH; Future - Protime-INR; Future - Vitamin B12; Future - Hemoglobin A1c; Future - Antinuclear Antib (ANA); Future - C-reactive protein; Future - Sed Rate (ESR); Future  3. Other fatigue -  Uncertain etiology, could be depression in nature. Will check the above labwork, and patient will follow-up in 2 weeks to discuss all labs, will consider referral or imaging studies if necessary. - Consider neurological referral  4. Encounter for monitoring coumadin therapy - May need to consider another form of therapy, with increased fall risk. - Protime-INR; Future  5. Hemorrhoids, unspecified hemorrhoid type - Red flags discussed with patient and her daughter today, she is to monitor for any bleeding, signs of infection or fever. - Anusol rectal cream and suppositories called in today, patient to follow-up in 2 weeks, sooner if any red flags above are noted. - hydrocortisone (ANUSOL-HC) 25 MG suppository; Place 1 suppository (25 mg total) rectally 2 (two) times daily.  Dispense: 12 suppository; Refill: 0 - hydrocortisone (ANUSOL-HC) 2.5 % rectal cream; Place 1 application rectally 2 (two) times daily.  Dispense: 30 g; Refill: 0  6. Depression - Patient reports that she is stable on Celexa  20 mg tablet, will need to obtain a full depression screening on next visit. - citalopram (CELEXA) 20 MG tablet; Take 1 tablet (20 mg total) by mouth daily.  Dispense: 90 tablet; Refill: 1  7. Hyperlipidemia - Restart lovastatin (MEVACOR) 20 MG tablet; Take 1 tablet (20 mg total) by mouth at bedtime.  Dispense: 90 tablet; Refill: 3  8. Essential hypertension, benign - Uncertain choice of regimen, will need to obtain records from prior PCPs. Patient asked to monitor her blood pressures at home, with goal to be below 140/90. - losartan (COZAAR) 100 MG tablet; Take 1 tablet (100 mg total) by mouth daily.  Dispense: 90 tablet; Refill: 1 - spironolactone (ALDACTONE) 25 MG tablet; Take 0.5 tablets (12.5 mg total) by mouth daily.  Dispense: 45 tablet; Refill: 1  9. Urinary incontinence, unspecified incontinence type - Ambulatory referral to Urology  10. Fall, initial encounter - Falls risk assessment/home  health referral placed today.  11. Arm fracture, right, closed, initial encounter - Patient to continue to follow with [facility] orthopedics  12. Dry mouth - DDX: Sjogren's, dehydration, diabetes - Labs per above.  13. Health care maintenance - Records are requested, patient declines flu shot today. - Patient has never had a colonoscopy, declines - Patient will need mammogram, will need to wait until arm fracture is healed. Last mammogram reportedly 2015, patient did have breast cancer in 2013 with lumpectomy and what sounds like a sentinel node biopsy with radiation. Records have been requested. - Uncertain immunization record - Uncertain if patient has had a DEXA scan  14. Risk for falls - Ambulatory referral to [facility] -  referral to home health for falls risk assessment and strengthening of lower extremities. Caution with physical therapy secondary to patient having right fractured humerus.  15. Constipation, unspecified constipation type - Likely secondary to narcotic use, encouraged patient to use polyethylene glycol powder one time a day and 8 ounces of water. She is to use this until her bowel movements are soft and moving regularly.  - polyethylene glycol powder (GLYCOLAX/MIRALAX) powder; Take 17 g by mouth daily.  Dispense: 500 g; Refill: 1

## 2015-03-03 NOTE — Progress Notes (Signed)
Pre visit review using our clinic review tool, if applicable. No additional management support is needed unless otherwise documented below in the visit note. 

## 2015-03-05 DIAGNOSIS — R319 Hematuria, unspecified: Secondary | ICD-10-CM | POA: Diagnosis not present

## 2015-03-05 DIAGNOSIS — S42301S Unspecified fracture of shaft of humerus, right arm, sequela: Secondary | ICD-10-CM | POA: Diagnosis not present

## 2015-03-05 DIAGNOSIS — I4891 Unspecified atrial fibrillation: Secondary | ICD-10-CM | POA: Diagnosis not present

## 2015-03-05 DIAGNOSIS — S42201S Unspecified fracture of upper end of right humerus, sequela: Secondary | ICD-10-CM | POA: Diagnosis not present

## 2015-03-05 DIAGNOSIS — Z7901 Long term (current) use of anticoagulants: Secondary | ICD-10-CM | POA: Diagnosis not present

## 2015-03-05 DIAGNOSIS — I10 Essential (primary) hypertension: Secondary | ICD-10-CM | POA: Diagnosis not present

## 2015-03-05 DIAGNOSIS — N390 Urinary tract infection, site not specified: Secondary | ICD-10-CM | POA: Diagnosis not present

## 2015-03-05 DIAGNOSIS — E7800 Pure hypercholesterolemia, unspecified: Secondary | ICD-10-CM | POA: Diagnosis not present

## 2015-03-05 DIAGNOSIS — R5383 Other fatigue: Secondary | ICD-10-CM | POA: Diagnosis not present

## 2015-03-05 DIAGNOSIS — Z853 Personal history of malignant neoplasm of breast: Secondary | ICD-10-CM | POA: Diagnosis not present

## 2015-03-05 DIAGNOSIS — Z79899 Other long term (current) drug therapy: Secondary | ICD-10-CM | POA: Diagnosis not present

## 2015-03-05 DIAGNOSIS — K649 Unspecified hemorrhoids: Secondary | ICD-10-CM | POA: Diagnosis not present

## 2015-03-06 ENCOUNTER — Telehealth: Payer: Self-pay | Admitting: Family Medicine

## 2015-03-06 NOTE — Telephone Encounter (Signed)
Did this patient have her labs drawn? If not, can we call them and remind them to get them completed.

## 2015-03-06 NOTE — Telephone Encounter (Signed)
Spoke to pt's daugher, Joseph Art.  Pt did not make it to the lab because she ended up feeling so bad she went to ER.  Diane is going to try to get records asap so we know what labs were drawn and what is still needed.

## 2015-03-06 NOTE — Telephone Encounter (Signed)
LMOM for pt to CB.  

## 2015-03-08 DIAGNOSIS — R5383 Other fatigue: Secondary | ICD-10-CM | POA: Diagnosis not present

## 2015-03-08 DIAGNOSIS — M6281 Muscle weakness (generalized): Secondary | ICD-10-CM | POA: Diagnosis not present

## 2015-03-08 DIAGNOSIS — R531 Weakness: Secondary | ICD-10-CM | POA: Diagnosis not present

## 2015-03-08 DIAGNOSIS — I10 Essential (primary) hypertension: Secondary | ICD-10-CM | POA: Diagnosis not present

## 2015-03-09 ENCOUNTER — Telehealth: Payer: Self-pay | Admitting: Family Medicine

## 2015-03-09 DIAGNOSIS — H8113 Benign paroxysmal vertigo, bilateral: Secondary | ICD-10-CM | POA: Diagnosis not present

## 2015-03-09 DIAGNOSIS — E531 Pyridoxine deficiency: Secondary | ICD-10-CM | POA: Diagnosis not present

## 2015-03-09 DIAGNOSIS — G603 Idiopathic progressive neuropathy: Secondary | ICD-10-CM | POA: Diagnosis not present

## 2015-03-09 DIAGNOSIS — E559 Vitamin D deficiency, unspecified: Secondary | ICD-10-CM | POA: Diagnosis not present

## 2015-03-09 DIAGNOSIS — R5383 Other fatigue: Secondary | ICD-10-CM | POA: Diagnosis not present

## 2015-03-09 DIAGNOSIS — G609 Hereditary and idiopathic neuropathy, unspecified: Secondary | ICD-10-CM | POA: Diagnosis not present

## 2015-03-09 DIAGNOSIS — R2681 Unsteadiness on feet: Secondary | ICD-10-CM | POA: Diagnosis not present

## 2015-03-09 DIAGNOSIS — E538 Deficiency of other specified B group vitamins: Secondary | ICD-10-CM | POA: Diagnosis not present

## 2015-03-09 DIAGNOSIS — E119 Type 2 diabetes mellitus without complications: Secondary | ICD-10-CM | POA: Diagnosis not present

## 2015-03-09 DIAGNOSIS — G589 Mononeuropathy, unspecified: Secondary | ICD-10-CM | POA: Diagnosis not present

## 2015-03-09 LAB — COMPREHENSIVE METABOLIC PANEL
ALBUMIN: 3.8 g/dL (ref 3.6–5.1)
ALT: 18 U/L (ref 6–29)
AST: 19 U/L (ref 10–35)
Alkaline Phosphatase: 100 U/L (ref 33–130)
BILIRUBIN TOTAL: 0.6 mg/dL (ref 0.2–1.2)
BUN: 20 mg/dL (ref 7–25)
CALCIUM: 9.2 mg/dL (ref 8.6–10.4)
CHLORIDE: 104 mmol/L (ref 98–110)
CO2: 21 mmol/L (ref 20–31)
Creat: 1.02 mg/dL — ABNORMAL HIGH (ref 0.60–0.93)
Glucose, Bld: 144 mg/dL — ABNORMAL HIGH (ref 65–99)
Potassium: 3.3 mmol/L — ABNORMAL LOW (ref 3.5–5.3)
Sodium: 138 mmol/L (ref 135–146)
Total Protein: 6.9 g/dL (ref 6.1–8.1)

## 2015-03-09 LAB — VITAMIN D 25 HYDROXY (VIT D DEFICIENCY, FRACTURES): Vit D, 25-Hydroxy: 25 ng/mL — ABNORMAL LOW (ref 30–100)

## 2015-03-09 LAB — TSH: TSH: 3.334 u[IU]/mL (ref 0.350–4.500)

## 2015-03-09 LAB — HEMOGLOBIN A1C
Hgb A1c MFr Bld: 5.8 % — ABNORMAL HIGH (ref <5.7)
Mean Plasma Glucose: 120 mg/dL — ABNORMAL HIGH (ref <117)

## 2015-03-09 LAB — VITAMIN B12

## 2015-03-09 LAB — C-REACTIVE PROTEIN: CRP: <0.5 mg/dL (ref <0.60)

## 2015-03-09 LAB — SEDIMENTATION RATE: SED RATE: 34 mm/h — AB (ref 0–30)

## 2015-03-09 LAB — ANA: ANA: NEGATIVE

## 2015-03-09 MED ORDER — VITAMIN D (ERGOCALCIFEROL) 1.25 MG (50000 UNIT) PO CAPS: 50000.0000 [IU] | ORAL_CAPSULE | ORAL | Status: DC

## 2015-03-09 NOTE — Telephone Encounter (Signed)
Please call pt/daughter:  - We will go into patient's detailed results at her follow-up appointment next week. - Acutely we need to start on vitamin D supplementation, her vitamin D is low approximately 25. I have called in a once a WEEK, supplement for 12 weeks.  - Her potassium is mildly low, and she could benefit from potassium rich food bananas, sweet potato, mushrooms, prunes and  yogurt are good examples. Would NOT encourage green leafy veggies for potassium since she is on coumadin.  - Her INR was stable and she should keep her dose the same on her coumadin. We will re-check again on her next appt, then she can maintain her INR checks through cardiology who will be managing her A. fib

## 2015-03-09 NOTE — Telephone Encounter (Signed)
LMOM for pt's daughter to CB.

## 2015-03-10 NOTE — Telephone Encounter (Signed)
Pt's daughter aware of results and medication / diet instructions.  No questions at this time from pt's daughter.

## 2015-03-13 DIAGNOSIS — I480 Paroxysmal atrial fibrillation: Secondary | ICD-10-CM | POA: Diagnosis not present

## 2015-03-13 DIAGNOSIS — F329 Major depressive disorder, single episode, unspecified: Secondary | ICD-10-CM | POA: Diagnosis not present

## 2015-03-13 DIAGNOSIS — R32 Unspecified urinary incontinence: Secondary | ICD-10-CM | POA: Diagnosis not present

## 2015-03-13 DIAGNOSIS — E785 Hyperlipidemia, unspecified: Secondary | ICD-10-CM | POA: Diagnosis not present

## 2015-03-13 DIAGNOSIS — K649 Unspecified hemorrhoids: Secondary | ICD-10-CM | POA: Diagnosis not present

## 2015-03-13 DIAGNOSIS — Z7901 Long term (current) use of anticoagulants: Secondary | ICD-10-CM | POA: Diagnosis not present

## 2015-03-13 DIAGNOSIS — R5383 Other fatigue: Secondary | ICD-10-CM | POA: Diagnosis not present

## 2015-03-13 DIAGNOSIS — I10 Essential (primary) hypertension: Secondary | ICD-10-CM | POA: Diagnosis not present

## 2015-03-13 DIAGNOSIS — Z9181 History of falling: Secondary | ICD-10-CM | POA: Diagnosis not present

## 2015-03-13 DIAGNOSIS — R269 Unspecified abnormalities of gait and mobility: Secondary | ICD-10-CM | POA: Diagnosis not present

## 2015-03-13 DIAGNOSIS — K5900 Constipation, unspecified: Secondary | ICD-10-CM | POA: Diagnosis not present

## 2015-03-13 DIAGNOSIS — S42201D Unspecified fracture of upper end of right humerus, subsequent encounter for fracture with routine healing: Secondary | ICD-10-CM | POA: Diagnosis not present

## 2015-03-13 DIAGNOSIS — R531 Weakness: Secondary | ICD-10-CM | POA: Diagnosis not present

## 2015-03-13 DIAGNOSIS — S42301D Unspecified fracture of shaft of humerus, right arm, subsequent encounter for fracture with routine healing: Secondary | ICD-10-CM | POA: Diagnosis not present

## 2015-03-17 DIAGNOSIS — Z9181 History of falling: Secondary | ICD-10-CM | POA: Diagnosis not present

## 2015-03-17 DIAGNOSIS — R269 Unspecified abnormalities of gait and mobility: Secondary | ICD-10-CM | POA: Diagnosis not present

## 2015-03-17 DIAGNOSIS — S42301D Unspecified fracture of shaft of humerus, right arm, subsequent encounter for fracture with routine healing: Secondary | ICD-10-CM | POA: Diagnosis not present

## 2015-03-17 DIAGNOSIS — F329 Major depressive disorder, single episode, unspecified: Secondary | ICD-10-CM | POA: Diagnosis not present

## 2015-03-17 DIAGNOSIS — I10 Essential (primary) hypertension: Secondary | ICD-10-CM | POA: Diagnosis not present

## 2015-03-17 DIAGNOSIS — I480 Paroxysmal atrial fibrillation: Secondary | ICD-10-CM | POA: Diagnosis not present

## 2015-03-20 ENCOUNTER — Ambulatory Visit (INDEPENDENT_AMBULATORY_CARE_PROVIDER_SITE_OTHER): Payer: Medicare Other | Admitting: Family Medicine

## 2015-03-20 ENCOUNTER — Encounter: Payer: Self-pay | Admitting: Family Medicine

## 2015-03-20 DIAGNOSIS — Z7901 Long term (current) use of anticoagulants: Secondary | ICD-10-CM | POA: Diagnosis not present

## 2015-03-20 DIAGNOSIS — Z5181 Encounter for therapeutic drug level monitoring: Secondary | ICD-10-CM | POA: Diagnosis not present

## 2015-03-20 DIAGNOSIS — R32 Unspecified urinary incontinence: Secondary | ICD-10-CM

## 2015-03-20 DIAGNOSIS — F329 Major depressive disorder, single episode, unspecified: Secondary | ICD-10-CM

## 2015-03-20 DIAGNOSIS — F32A Depression, unspecified: Secondary | ICD-10-CM

## 2015-03-20 LAB — URINALYSIS, ROUTINE W REFLEX MICROSCOPIC
Bilirubin Urine: NEGATIVE
Hgb urine dipstick: NEGATIVE
Ketones, ur: NEGATIVE
Nitrite: NEGATIVE
SPECIFIC GRAVITY, URINE: 1.025 (ref 1.000–1.030)
Total Protein, Urine: 100 — AB
Urine Glucose: NEGATIVE
Urobilinogen, UA: 0.2 (ref 0.0–1.0)
pH: 6 (ref 5.0–8.0)

## 2015-03-20 LAB — PROTIME-INR
INR: 3.1 ratio — AB (ref 0.8–1.0)
PROTHROMBIN TIME: 33.2 s — AB (ref 9.6–13.1)

## 2015-03-20 MED ORDER — WARFARIN SODIUM 1 MG PO TABS: 1.5000 mg | ORAL_TABLET | Freq: Every day | ORAL | Status: DC

## 2015-03-20 MED ORDER — WARFARIN SODIUM 2 MG PO TABS: 2.0000 mg | ORAL_TABLET | Freq: Every day | ORAL | Status: DC

## 2015-03-20 MED ORDER — SERTRALINE HCL 25 MG PO TABS: ORAL_TABLET | ORAL | Status: DC

## 2015-03-20 NOTE — Patient Instructions (Signed)
I will need to see you in 4 weeks, about your depression follow up.  Start the zoloft as discussed. 25 mg 1 week then 50 mg thereafter.  Major Depressive Disorder Major depressive disorder is a mental illness. It also may be called clinical depression or unipolar depression. Major depressive disorder usually causes feelings of sadness, hopelessness, or helplessness. Some people with this disorder do not feel particularly sad but lose interest in doing things they used to enjoy (anhedonia). Major depressive disorder also can cause physical symptoms. It can interfere with work, school, relationships, and other normal everyday activities. The disorder varies in severity but is longer lasting and more serious than the sadness we all feel from time to time in our lives. Major depressive disorder often is triggered by stressful life events or major life changes. Examples of these triggers include divorce, loss of your job or home, a move, and the death of a family member or close friend. Sometimes this disorder occurs for no obvious reason at all. People who have family members with major depressive disorder or bipolar disorder are at higher risk for developing this disorder, with or without life stressors. Major depressive disorder can occur at any age. It may occur just once in your life (single episode major depressive disorder). It may occur multiple times (recurrent major depressive disorder). SYMPTOMS People with major depressive disorder have either anhedonia or depressed mood on nearly a daily basis for at least 2 weeks or longer. Symptoms of depressed mood include:  Feelings of sadness (blue or down in the dumps) or emptiness.  Feelings of hopelessness or helplessness.  Tearfulness or episodes of crying (may be observed by others).  Irritability (children and adolescents). In addition to depressed mood or anhedonia or both, people with this disorder have at least four of the following  symptoms:  Difficulty sleeping or sleeping too much.   Significant change (increase or decrease) in appetite or weight.   Lack of energy or motivation.  Feelings of guilt and worthlessness.   Difficulty concentrating, remembering, or making decisions.  Unusually slow movement (psychomotor retardation) or restlessness (as observed by others).   Recurrent wishes for death, recurrent thoughts of self-harm (suicide), or a suicide attempt. People with major depressive disorder commonly have persistent negative thoughts about themselves, other people, and the world. People with severe major depressive disorder may experiencedistorted beliefs or perceptions about the world (psychotic delusions). They also may see or hear things that are not real (psychotic hallucinations). DIAGNOSIS Major depressive disorder is diagnosed through an assessment by your health care provider. Your health care provider will ask aboutaspects of your daily life, such as mood,sleep, and appetite, to see if you have the diagnostic symptoms of major depressive disorder. Your health care provider may ask about your medical history and use of alcohol or drugs, including prescription medicines. Your health care provider also may do a physical exam and blood work. This is because certain medical conditions and the use of certain substances can cause major depressive disorder-like symptoms (secondary depression). Your health care provider also may refer you to a mental health specialist for further evaluation and treatment. TREATMENT It is important to recognize the symptoms of major depressive disorder and seek treatment. The following treatments can be prescribed for this disorder:   Medicine. Antidepressant medicines usually are prescribed. Antidepressant medicines are thought to correct chemical imbalances in the brain that are commonly associated with major depressive disorder. Other types of medicine may be added if the  symptoms  do not respond to antidepressant medicines alone or if psychotic delusions or hallucinations occur.  Talk therapy. Talk therapy can be helpful in treating major depressive disorder by providing support, education, and guidance. Certain types of talk therapy also can help with negative thinking (cognitive behavioral therapy) and with relationship issues that trigger this disorder (interpersonal therapy). A mental health specialist can help determine which treatment is best for you. Most people with major depressive disorder do well with a combination of medicine and talk therapy. Treatments involving electrical stimulation of the brain can be used in situations with extremely severe symptoms or when medicine and talk therapy do not work over time. These treatments include electroconvulsive therapy, transcranial magnetic stimulation, and vagal nerve stimulation.   This information is not intended to replace advice given to you by your health care provider. Make sure you discuss any questions you have with your health care provider.   Document Released: 09/14/2012 Document Revised: 06/10/2014 Document Reviewed: 09/14/2012 Elsevier Interactive Patient Education [employer].

## 2015-03-20 NOTE — Progress Notes (Signed)
Subjective:    Patient ID: Kathy Hobbs, female    DOB: [birth year], 74 y.o.   MRN: [id number]  HPI  Depression: Patient has had increasingly more depression since January where she's had her last surgery to her legs. She states her mobility decreased after that surgery. She has less desire to get out of bed in the morning her daughter is with her today and states that it's difficult to get her mother to even shower. Patient endorses that nearly every day she feels little interest or pleasure in doing things, trouble falling or staying asleep, feeling tired and having little energy, poor appetite, and moving or speaking slowly that other people notice. More than half the day she feels down, depressed or hopeless, trouble concentrating on things such as reading a newspaper or watching a television. A full day she feels bad about herself, several days she feels like she had rather be dead, but has no suicidal ideation or plan. Patient is currently on Celexa 20 mg daily.   UTI: Patient was recently seen in the emergency room, and had a urinary tract infection. Patient was treated with antibiotics, and feels that her urinary symptoms have improved. She still admits to some minimal incontinence, but less urgency. She does have an appointment set up with urology at the end of this month for her incontinence.  A.fib: Patient's INR on 03/08/2015 was 3.1. Patient and daughter states that she takes 1.5 mg of Coumadin on Monday, Wednesday and Friday. She takes 2 mg of Coumadin on Tuesday/Thursday/Saturday and Sunday. Discussed with patient close monitoring of her INR, considering her history of being "very high "when she came from [place] just a few weeks ago. Patient is at increased risk for falls, recent humeral fracture after fall. Patient denies any chest pain, palpitations, shortness of breath, syncope. Patient has been referred to cardiology, and has an appointment set up in a few weeks to establish.  Discussed with patient today that they will be managing her A. fib and her INR, if they decide to keep her on the Coumadin.  Past Medical History  Diagnosis Date  . Depression   . Hypertension   . Hyperlipidemia   . Urine incontinence   . Atrial fibrillation ([place])   . Breast cancer ([place])   . Vertigo     No Known Allergies  Social History   Social History  . Marital Status: Unknown    Spouse Name: N/A  . Number of Children: N/A  . Years of Education: N/A   Occupational History  . Not on file.   Social History Main Topics  . Smoking status: Never Smoker   . Smokeless tobacco: Never Used  . Alcohol Use: No  . Drug Use: No  . Sexual Activity: No   Other Topics Concern  . Not on file   Social History Narrative   Married,. Currently lives with daughter in [place] from [place] secondary to declining health.    Wears seatbelt, wears bike helmet.    - does not exercise daily.    - takes vitamins.    - Smoke alarm in the home. Guns in the home, locked case.   - feels safe in her relationships.     Review of Systems Negative, with the exception of above mentioned in HPI    Objective:   Physical Exam BP 127/79 mmHg  Pulse 101  Temp(Src) 98.2 F (36.8 C) (Oral)  Ht 5\' 6"  (1.676 m)  Wt 160 lb 6.4 oz (72.757  kg)  BMI 25.90 kg/m2  SpO2 94% Gen: Afebrile. No acute distress. Nontoxic in appearance, well-developed, well-nourished Caucasian female, very pleasant. HENT: AT. [place].  MMM.  Eyes:Pupils Equal Round Reactive to light, Extraocular movements intact,  Conjunctiva without redness, discharge or icterus. Neck/lymp/endocrine: Supple, no lymphadenopathy, no thyromegaly CV: RRR no murmur appreciated, no edema, +2/4 P posterior tibialis pulses Chest: CTAB, no wheeze or crackles Psych: Normal  dress and demeanor. Mildly slowed speech. Appears sad, but does smile and make good eye contact. Normal thought content and judgment.      Assessment & Plan:  1. Urinary  incontinence, unspecified incontinence type - Patient with recent urinary tract infection, treated with antibiotics. Patient feels that her symptoms are resolved but she still has mild incontinence today. Will repeat urinalysis and sent reflexively for culture. - Urinalysis, Routine w reflex microscopic - Urine Culture - Patient follow with urology for her urinary incontinence, she has appointment for this at the end of the month.  2. Encounter for monitoring coumadin therapy - A. fib  - Patient's daughter is to double check patient's actual Coumadin dose. Will request INR today, patient then advised to follow-up with her cardiologist for continued Coumadin checks. - Patient was referred to cardiology on her last appointment here, she has an appointment set up for this with in 2 weeks. Discussion today surrounding the risk versus benefits of Coumadin versus other types of blood thinners versus aspirin therapy alone with her fall risk. - INR/PT  3. Depression - PHQ 21 today. Worsening depression since January. Discussed management of depression today patient currently on Celexa 20 mg, which is max recommended dose per population over 60. Added Zoloft to her regimen today patient is to follow-up in 4 weeks. Complex CMP at that time to check for electrolyte imbalance. - sertraline (ZOLOFT) 25 MG tablet; 25 mg QD for 7 days, then 50 mg daily.  Dispense: 60 tablet; Refill: 0  > 25 minutes spent with patient, >50% of time spent face to face counseling patient and coordinating care.

## 2015-03-21 ENCOUNTER — Telehealth: Payer: Self-pay | Admitting: Family Medicine

## 2015-03-21 DIAGNOSIS — F329 Major depressive disorder, single episode, unspecified: Secondary | ICD-10-CM | POA: Diagnosis not present

## 2015-03-21 DIAGNOSIS — I480 Paroxysmal atrial fibrillation: Secondary | ICD-10-CM | POA: Diagnosis not present

## 2015-03-21 DIAGNOSIS — R269 Unspecified abnormalities of gait and mobility: Secondary | ICD-10-CM | POA: Diagnosis not present

## 2015-03-21 DIAGNOSIS — Z9181 History of falling: Secondary | ICD-10-CM | POA: Diagnosis not present

## 2015-03-21 DIAGNOSIS — S42301D Unspecified fracture of shaft of humerus, right arm, subsequent encounter for fracture with routine healing: Secondary | ICD-10-CM | POA: Diagnosis not present

## 2015-03-21 DIAGNOSIS — I10 Essential (primary) hypertension: Secondary | ICD-10-CM | POA: Diagnosis not present

## 2015-03-21 MED ORDER — WARFARIN SODIUM 1 MG PO TABS: 1.0000 mg | ORAL_TABLET | Freq: Every day | ORAL | Status: DC

## 2015-03-21 NOTE — Telephone Encounter (Signed)
Patients daughter aware of results and new coumadin schedule.  Pt's daughter had no questions at this time and is aware if they hear nothing about Urine culture, it's good.

## 2015-03-21 NOTE — Telephone Encounter (Signed)
Pt's daughter aware of coumadin dose.  No questions at this time.

## 2015-03-21 NOTE — Telephone Encounter (Signed)
Please call pt daughter:  - Her INR is 3.1, please have her take the lower dose medication (she believes it is 1.5 mg) in place of the 2 mg dose on Sunday.   - new schedule: M/W//F/Sunday 1.5 mg and Tues/Thurs/Saturday 2 mg. - I am still waiting on the urine culture results, that will take a few days. We will call them only if it is positive and we need to treat.

## 2015-03-21 NOTE — Telephone Encounter (Signed)
Pts daughter called back and stated that pt was only taking 1mg  M/W/F and 2mg  other days. She would like to know what pts coumadin regiment should be based off this change. Please advise. Thanks.

## 2015-03-21 NOTE — Telephone Encounter (Signed)
That would mean she take 1 mg M/W/F/sunday and 2 mg Tues/Thurs/ Saturday.

## 2015-03-21 NOTE — Addendum Note (Signed)
Addended by: Howard Pouch A on: 03/21/2015 10:48 AM   Modules accepted: Orders

## 2015-03-22 ENCOUNTER — Encounter: Payer: Self-pay | Admitting: Family Medicine

## 2015-03-22 DIAGNOSIS — IMO0001 Reserved for inherently not codable concepts without codable children: Secondary | ICD-10-CM | POA: Insufficient documentation

## 2015-03-22 DIAGNOSIS — H8113 Benign paroxysmal vertigo, bilateral: Secondary | ICD-10-CM | POA: Diagnosis not present

## 2015-03-22 DIAGNOSIS — R030 Elevated blood-pressure reading, without diagnosis of hypertension: Secondary | ICD-10-CM

## 2015-03-22 DIAGNOSIS — R42 Dizziness and giddiness: Secondary | ICD-10-CM | POA: Diagnosis not present

## 2015-03-22 DIAGNOSIS — M6281 Muscle weakness (generalized): Secondary | ICD-10-CM | POA: Insufficient documentation

## 2015-03-23 ENCOUNTER — Telehealth: Payer: Self-pay | Admitting: Family Medicine

## 2015-03-23 DIAGNOSIS — S42301D Unspecified fracture of shaft of humerus, right arm, subsequent encounter for fracture with routine healing: Secondary | ICD-10-CM | POA: Diagnosis not present

## 2015-03-23 DIAGNOSIS — R269 Unspecified abnormalities of gait and mobility: Secondary | ICD-10-CM | POA: Diagnosis not present

## 2015-03-23 DIAGNOSIS — B952 Enterococcus as the cause of diseases classified elsewhere: Secondary | ICD-10-CM

## 2015-03-23 DIAGNOSIS — N390 Urinary tract infection, site not specified: Principal | ICD-10-CM

## 2015-03-23 DIAGNOSIS — S42201D Unspecified fracture of upper end of right humerus, subsequent encounter for fracture with routine healing: Secondary | ICD-10-CM | POA: Diagnosis not present

## 2015-03-23 DIAGNOSIS — F329 Major depressive disorder, single episode, unspecified: Secondary | ICD-10-CM | POA: Diagnosis not present

## 2015-03-23 DIAGNOSIS — I10 Essential (primary) hypertension: Secondary | ICD-10-CM | POA: Diagnosis not present

## 2015-03-23 DIAGNOSIS — I480 Paroxysmal atrial fibrillation: Secondary | ICD-10-CM | POA: Diagnosis not present

## 2015-03-23 DIAGNOSIS — Z9181 History of falling: Secondary | ICD-10-CM | POA: Diagnosis not present

## 2015-03-23 LAB — URINE CULTURE

## 2015-03-23 MED ORDER — LEVOFLOXACIN 250 MG PO TABS: 250.0000 mg | ORAL_TABLET | Freq: Every day | ORAL | Status: DC

## 2015-03-23 NOTE — Telephone Encounter (Signed)
Spoke with patient daughter reviewed results and instructions for medication administration. She verbalized understanding.

## 2015-03-23 NOTE — Telephone Encounter (Signed)
Please call pts daughter: Her mother's urine looks ok. No infection.

## 2015-03-23 NOTE — Telephone Encounter (Signed)
Please apologize to patient's daughter, we can just call and told her that her mother's urinalysis was normal. I had accidentally looked at the preliminary results, and I just received final results from her mother urine collection. She does have a very mild urinary tract infection. I have called in an antibiotic for her to take 1 time a day for just 3 days. I would like to recheck her urine in approximately 2 weeks, to make sure we have cleared her infection. We can collect urine her follow-up appointment that was supposed to be scheduled in the next few weeks, unless she is having symptoms. I would want to see her sooner.

## 2015-03-23 NOTE — Telephone Encounter (Signed)
Reviewed urine results with patient daughter

## 2015-03-27 DIAGNOSIS — G603 Idiopathic progressive neuropathy: Secondary | ICD-10-CM | POA: Diagnosis not present

## 2015-03-27 DIAGNOSIS — R2681 Unsteadiness on feet: Secondary | ICD-10-CM | POA: Diagnosis not present

## 2015-03-27 DIAGNOSIS — M5417 Radiculopathy, lumbosacral region: Secondary | ICD-10-CM | POA: Diagnosis not present

## 2015-03-27 DIAGNOSIS — R5383 Other fatigue: Secondary | ICD-10-CM | POA: Diagnosis not present

## 2015-03-27 DIAGNOSIS — M545 Low back pain: Secondary | ICD-10-CM | POA: Diagnosis not present

## 2015-03-28 DIAGNOSIS — S42301D Unspecified fracture of shaft of humerus, right arm, subsequent encounter for fracture with routine healing: Secondary | ICD-10-CM | POA: Diagnosis not present

## 2015-03-28 DIAGNOSIS — F329 Major depressive disorder, single episode, unspecified: Secondary | ICD-10-CM | POA: Diagnosis not present

## 2015-03-28 DIAGNOSIS — Z9181 History of falling: Secondary | ICD-10-CM | POA: Diagnosis not present

## 2015-03-28 DIAGNOSIS — I10 Essential (primary) hypertension: Secondary | ICD-10-CM | POA: Diagnosis not present

## 2015-03-28 DIAGNOSIS — R269 Unspecified abnormalities of gait and mobility: Secondary | ICD-10-CM | POA: Diagnosis not present

## 2015-03-28 DIAGNOSIS — I480 Paroxysmal atrial fibrillation: Secondary | ICD-10-CM | POA: Diagnosis not present

## 2015-03-29 DIAGNOSIS — F329 Major depressive disorder, single episode, unspecified: Secondary | ICD-10-CM | POA: Diagnosis not present

## 2015-03-29 DIAGNOSIS — Z9181 History of falling: Secondary | ICD-10-CM | POA: Diagnosis not present

## 2015-03-29 DIAGNOSIS — I10 Essential (primary) hypertension: Secondary | ICD-10-CM | POA: Diagnosis not present

## 2015-03-29 DIAGNOSIS — I480 Paroxysmal atrial fibrillation: Secondary | ICD-10-CM | POA: Diagnosis not present

## 2015-03-29 DIAGNOSIS — R269 Unspecified abnormalities of gait and mobility: Secondary | ICD-10-CM | POA: Diagnosis not present

## 2015-03-29 DIAGNOSIS — S42301D Unspecified fracture of shaft of humerus, right arm, subsequent encounter for fracture with routine healing: Secondary | ICD-10-CM | POA: Diagnosis not present

## 2015-03-30 ENCOUNTER — Ambulatory Visit: Payer: Medicare Other | Admitting: Internal Medicine

## 2015-03-30 DIAGNOSIS — R269 Unspecified abnormalities of gait and mobility: Secondary | ICD-10-CM | POA: Diagnosis not present

## 2015-03-30 DIAGNOSIS — Z9181 History of falling: Secondary | ICD-10-CM | POA: Diagnosis not present

## 2015-03-30 DIAGNOSIS — F329 Major depressive disorder, single episode, unspecified: Secondary | ICD-10-CM | POA: Diagnosis not present

## 2015-03-30 DIAGNOSIS — I480 Paroxysmal atrial fibrillation: Secondary | ICD-10-CM | POA: Diagnosis not present

## 2015-03-30 DIAGNOSIS — S42301D Unspecified fracture of shaft of humerus, right arm, subsequent encounter for fracture with routine healing: Secondary | ICD-10-CM | POA: Diagnosis not present

## 2015-03-30 DIAGNOSIS — I10 Essential (primary) hypertension: Secondary | ICD-10-CM | POA: Diagnosis not present

## 2015-03-31 DIAGNOSIS — Z9181 History of falling: Secondary | ICD-10-CM | POA: Diagnosis not present

## 2015-03-31 DIAGNOSIS — I10 Essential (primary) hypertension: Secondary | ICD-10-CM | POA: Diagnosis not present

## 2015-03-31 DIAGNOSIS — I480 Paroxysmal atrial fibrillation: Secondary | ICD-10-CM | POA: Diagnosis not present

## 2015-03-31 DIAGNOSIS — S42301D Unspecified fracture of shaft of humerus, right arm, subsequent encounter for fracture with routine healing: Secondary | ICD-10-CM | POA: Diagnosis not present

## 2015-03-31 DIAGNOSIS — R269 Unspecified abnormalities of gait and mobility: Secondary | ICD-10-CM | POA: Diagnosis not present

## 2015-03-31 DIAGNOSIS — F329 Major depressive disorder, single episode, unspecified: Secondary | ICD-10-CM | POA: Diagnosis not present

## 2015-04-03 DIAGNOSIS — F329 Major depressive disorder, single episode, unspecified: Secondary | ICD-10-CM | POA: Diagnosis not present

## 2015-04-03 DIAGNOSIS — I10 Essential (primary) hypertension: Secondary | ICD-10-CM | POA: Diagnosis not present

## 2015-04-03 DIAGNOSIS — S42301D Unspecified fracture of shaft of humerus, right arm, subsequent encounter for fracture with routine healing: Secondary | ICD-10-CM | POA: Diagnosis not present

## 2015-04-03 DIAGNOSIS — I480 Paroxysmal atrial fibrillation: Secondary | ICD-10-CM | POA: Diagnosis not present

## 2015-04-03 DIAGNOSIS — Z9181 History of falling: Secondary | ICD-10-CM | POA: Diagnosis not present

## 2015-04-03 DIAGNOSIS — R269 Unspecified abnormalities of gait and mobility: Secondary | ICD-10-CM | POA: Diagnosis not present

## 2015-04-04 ENCOUNTER — Encounter: Payer: Self-pay | Admitting: Internal Medicine

## 2015-04-04 ENCOUNTER — Ambulatory Visit (INDEPENDENT_AMBULATORY_CARE_PROVIDER_SITE_OTHER): Payer: Medicare Other | Admitting: Pharmacist Clinician (PhC)/ Clinical Pharmacy Specialist

## 2015-04-04 ENCOUNTER — Ambulatory Visit (INDEPENDENT_AMBULATORY_CARE_PROVIDER_SITE_OTHER): Payer: Medicare Other | Admitting: Internal Medicine

## 2015-04-04 VITALS — BP 120/78 | HR 83 | Ht 66.0 in | Wt 163.3 lb

## 2015-04-04 DIAGNOSIS — I739 Peripheral vascular disease, unspecified: Secondary | ICD-10-CM

## 2015-04-04 DIAGNOSIS — R269 Unspecified abnormalities of gait and mobility: Secondary | ICD-10-CM | POA: Diagnosis not present

## 2015-04-04 DIAGNOSIS — F329 Major depressive disorder, single episode, unspecified: Secondary | ICD-10-CM | POA: Diagnosis not present

## 2015-04-04 DIAGNOSIS — I779 Disorder of arteries and arterioles, unspecified: Secondary | ICD-10-CM

## 2015-04-04 DIAGNOSIS — Z7901 Long term (current) use of anticoagulants: Secondary | ICD-10-CM | POA: Diagnosis not present

## 2015-04-04 DIAGNOSIS — I4891 Unspecified atrial fibrillation: Secondary | ICD-10-CM

## 2015-04-04 DIAGNOSIS — S42301D Unspecified fracture of shaft of humerus, right arm, subsequent encounter for fracture with routine healing: Secondary | ICD-10-CM | POA: Diagnosis not present

## 2015-04-04 DIAGNOSIS — I480 Paroxysmal atrial fibrillation: Secondary | ICD-10-CM

## 2015-04-04 DIAGNOSIS — D6869 Other thrombophilia: Secondary | ICD-10-CM | POA: Insufficient documentation

## 2015-04-04 DIAGNOSIS — I701 Atherosclerosis of renal artery: Secondary | ICD-10-CM | POA: Diagnosis not present

## 2015-04-04 DIAGNOSIS — I10 Essential (primary) hypertension: Secondary | ICD-10-CM

## 2015-04-04 DIAGNOSIS — Z5181 Encounter for therapeutic drug level monitoring: Secondary | ICD-10-CM

## 2015-04-04 DIAGNOSIS — E785 Hyperlipidemia, unspecified: Secondary | ICD-10-CM

## 2015-04-04 DIAGNOSIS — Z9181 History of falling: Secondary | ICD-10-CM | POA: Diagnosis not present

## 2015-04-04 LAB — POCT INR: INR: 2.8

## 2015-04-04 NOTE — Patient Instructions (Addendum)
Your physician has requested that you have a renal artery duplex. During this test, an ultrasound is used to evaluate blood flow to the kidneys. Allow one hour for this exam. Do not eat after midnight the day before and avoid carbonated beverages. Take your medications as you usually do.  Your physician has requested that you have a lower extremity arterial duplex. This test is an ultrasound of the arteries in the legs. It looks at arterial blood flow in the legs. Allow one hour for Lower Arterial scans. There are no restrictions or special instructions  Your physician has requested that you have a lexiscan myoview. For further information please visit [email or web address]. Please follow instruction sheet, as given.  Your physician recommends that you return for lab work - FASTING >> lab is on 1st floor of Dr. Lysbeth Penner building - [id number]    Your physician recommends that you schedule a follow-up appointment after your tests  Records to be requested from Dr. Mariel Sleet - [facility] [place], [place] & Dr. Sullivan Lone (vascular surgeon [place], [place])

## 2015-04-04 NOTE — Progress Notes (Signed)
OFFICE NOTE  Chief Complaint:  Establish cardiologist  Primary Care Physician: Howard Pouch, DO  HPI:  Kathy Hobbs is a pleasant 74 year old female who is kindly referred to me for establishing cardiac care. Kathy Hobbs has not previously seen a cardiologist, but has had several years of atrial fibrillation. Per her report she says it comes and goes although she was noted to be in A. fib today. She was placed on warfarin by her primary care provider in [place] prior to moving up to live with her daughter. Her INR was recently checked by her PCP at 3.1 and mild adjustments were made to her medicines. It was requested that we follow her warfarin and manage it in our office. Kathy Hobbs also has a significant history of PAD. She recently had carotid Dopplers apparently by her neurologist in [place] which showed a 40% right carotid artery stenosis. She also has a history of PAD and peripheral stents as well as bilateral renal artery stenosis status post PCI about one year ago by Dr. Sullivan Lone in [facility]. She was previously seeing Dr. Jackalyn Lombard, with the [facility] in [place]. She currently reports fatigue and some shortness of breath with exertion. She denies any chest pain. She's never had an ischemia evaluation to her knowledge. Recently he's been having some falls and in fact fell and fractured her right humerus. That is very slow to heal. She is also reporting some pain when walking and swelling of her legs.  PMHx:  Past Medical History  Diagnosis Date  . Depression   . Hypertension   . Hyperlipidemia   . Urine incontinence   . Atrial fibrillation ([place])   . Breast cancer ([place])   . Vertigo     Past Surgical History  Procedure Laterality Date  . Breast biopsy    . Breast lumpectomy    . Renal artery stent Bilateral   . Femoral artery stent      ? pt states "Stent in my leg"    FAMHx:  Family History  Problem Relation Age of Onset  . Heart attack  Father   . Alcohol abuse Brother   . Breast cancer Paternal Grandmother     SOCHx:   reports that she has never smoked. She has never used smokeless tobacco. She reports that she does not drink alcohol or use illicit drugs.  ALLERGIES:  No Known Allergies  ROS: A comprehensive review of systems was negative except for: Constitutional: positive for fatigue Respiratory: positive for dyspnea on exertion Cardiovascular: positive for claudication  HOME MEDS: Current Outpatient Prescriptions  Medication Sig Dispense Refill  . citalopram (CELEXA) 20 MG tablet Take 1 tablet (20 mg total) by mouth daily. 90 tablet 1  . levofloxacin (LEVAQUIN) 250 MG tablet Take 1 tablet (250 mg total) by mouth daily. 3 tablet 0  . losartan (COZAAR) 100 MG tablet Take 1 tablet (100 mg total) by mouth daily. 90 tablet 1  . sertraline (ZOLOFT) 25 MG tablet 25 mg QD for 7 days, then 50 mg daily. 60 tablet 0  . spironolactone (ALDACTONE) 25 MG tablet Take 0.5 tablets (12.5 mg total) by mouth daily. 45 tablet 1  . Vitamin D, Ergocalciferol, (DRISDOL) 50000 UNITS CAPS capsule Take 1 capsule (50,000 Units total) by mouth every 7 (seven) days. 12 capsule 0  . warfarin (COUMADIN) 1 MG tablet Take 1 tablet (1 mg total) by mouth daily. 60 tablet 0  . warfarin (COUMADIN) 2 MG tablet Take 1 tablet (2 mg  total) by mouth daily. 30 tablet 0   No current facility-administered medications for this visit.    LABS/IMAGING: No results found for this or any previous visit (from the past 48 hour(s)). No results found.  WEIGHTS: Wt Readings from Last 3 Encounters:  04/04/15 163 lb 4.8 oz (74.072 kg)  03/20/15 160 lb 6.4 oz (72.757 kg)  03/03/15 165 lb (74.844 kg)    VITALS: BP 120/78 mmHg  Pulse 83  Ht 5\' 6"  (1.676 m)  Wt 163 lb 4.8 oz (74.072 kg)  BMI 26.37 kg/m2  EXAM: General appearance: alert and no distress Neck: no JVD and Right carotid bruit Lungs: clear to auscultation bilaterally Heart: irregularly  irregular rhythm Abdomen: soft, non-tender; bowel sounds normal; no masses,  no organomegaly Extremities: edema 1+ bilateral and venous stasis dermatitis noted Pulses: 1+ pulses Skin: Skin color, texture, turgor normal. No rashes or lesions Neurologic: Grossly normal Psych: Pleasant  EKG: Atrial fibrillation at 83  ASSESSMENT: 1. Atrial fibrillation possibly paroxysmal, asymptomatic- CHADSVASC 3 2. Hypertension-controlled 3. PAD-status post peripheral stenting, some claudication 4. Bilateral renal artery stenosis status post bilateral stenting 5. Carotid artery disease with 40% R ICA stenosis 6. History of dyslipidemia-not on statin  PLAN: 1.   Kathy Hobbs has significant PAD as well as atrial fibrillation which is other paroxysmal or perhaps persistent. Given her CHADSVASC of 3, anticoagulation is recommended with either warfarin or a direct oral anticoagulant. She prefers to use warfarin and given her recent falls it may be safer as an alternative to be able to reverse her anticoagulation. We will have to watch those falls closely, if she were deemed not to be an anticoagulation candidate, an alternative may be implantation of a Watchman left atrial appendage occluder device, which would obviate the need for anticoagulation. She is due for reassessment of her PAD. It sounds like she's having some claudication symptoms. I like to recheck lower extremity arterial Dopplers as well as renal Dopplers in the office. In addition, it does not seem that she's had an ischemia workup in the past. I like to order a Lexiscan nuclear stress test. She is reporting fatigue and shortness of breath. It was thought that some of her symptoms may be due to low vitamin D, for which she is now undergoing replacement. I'm concerned about possible multivessel coronary artery disease. I'll also check a lipid profile and strongly suggest that she go on statin therapy. Apparently this was suggested by her primary care  provider but the patient's husband is concerned about her taking statins. We can discuss this further at follow-up. Hopefully I'll have records from [place] to review at that time.  Thank you for the kind referral. Greater than 60 minutes was spent reviewing her records, interviewing and examining her and documenting her complex prior medical history.  Pixie Casino, MD, [facility] Attending Cardiologist Selmer C Hilty 04/04/2015, 4:58 PM

## 2015-04-05 ENCOUNTER — Telehealth: Payer: Self-pay | Admitting: Internal Medicine

## 2015-04-05 DIAGNOSIS — I480 Paroxysmal atrial fibrillation: Secondary | ICD-10-CM | POA: Diagnosis not present

## 2015-04-05 DIAGNOSIS — R269 Unspecified abnormalities of gait and mobility: Secondary | ICD-10-CM | POA: Diagnosis not present

## 2015-04-05 DIAGNOSIS — I10 Essential (primary) hypertension: Secondary | ICD-10-CM | POA: Diagnosis not present

## 2015-04-05 DIAGNOSIS — Z9181 History of falling: Secondary | ICD-10-CM | POA: Diagnosis not present

## 2015-04-05 DIAGNOSIS — F329 Major depressive disorder, single episode, unspecified: Secondary | ICD-10-CM | POA: Diagnosis not present

## 2015-04-05 DIAGNOSIS — S42301D Unspecified fracture of shaft of humerus, right arm, subsequent encounter for fracture with routine healing: Secondary | ICD-10-CM | POA: Diagnosis not present

## 2015-04-05 NOTE — Telephone Encounter (Signed)
Faxed Release signed by patient to [facility] to obtain past records per Dr [facility] request.  Faxed release to [phone]. lp

## 2015-04-05 NOTE — Telephone Encounter (Signed)
Faxed Release signed by patient to [facility] to obtain past records per Dr [facility] request.  Faxed to [phone].

## 2015-04-06 DIAGNOSIS — Z4789 Encounter for other orthopedic aftercare: Secondary | ICD-10-CM | POA: Diagnosis not present

## 2015-04-06 DIAGNOSIS — S42201D Unspecified fracture of upper end of right humerus, subsequent encounter for fracture with routine healing: Secondary | ICD-10-CM | POA: Diagnosis not present

## 2015-04-06 DIAGNOSIS — M25521 Pain in right elbow: Secondary | ICD-10-CM | POA: Diagnosis not present

## 2015-04-10 DIAGNOSIS — Z9181 History of falling: Secondary | ICD-10-CM | POA: Diagnosis not present

## 2015-04-10 DIAGNOSIS — I480 Paroxysmal atrial fibrillation: Secondary | ICD-10-CM | POA: Diagnosis not present

## 2015-04-10 DIAGNOSIS — F329 Major depressive disorder, single episode, unspecified: Secondary | ICD-10-CM | POA: Diagnosis not present

## 2015-04-10 DIAGNOSIS — I10 Essential (primary) hypertension: Secondary | ICD-10-CM | POA: Diagnosis not present

## 2015-04-10 DIAGNOSIS — S42301D Unspecified fracture of shaft of humerus, right arm, subsequent encounter for fracture with routine healing: Secondary | ICD-10-CM | POA: Diagnosis not present

## 2015-04-10 DIAGNOSIS — R269 Unspecified abnormalities of gait and mobility: Secondary | ICD-10-CM | POA: Diagnosis not present

## 2015-04-11 DIAGNOSIS — I10 Essential (primary) hypertension: Secondary | ICD-10-CM | POA: Diagnosis not present

## 2015-04-11 DIAGNOSIS — Z9181 History of falling: Secondary | ICD-10-CM | POA: Diagnosis not present

## 2015-04-11 DIAGNOSIS — I480 Paroxysmal atrial fibrillation: Secondary | ICD-10-CM | POA: Diagnosis not present

## 2015-04-11 DIAGNOSIS — S42301D Unspecified fracture of shaft of humerus, right arm, subsequent encounter for fracture with routine healing: Secondary | ICD-10-CM | POA: Diagnosis not present

## 2015-04-11 DIAGNOSIS — R269 Unspecified abnormalities of gait and mobility: Secondary | ICD-10-CM | POA: Diagnosis not present

## 2015-04-11 DIAGNOSIS — F329 Major depressive disorder, single episode, unspecified: Secondary | ICD-10-CM | POA: Diagnosis not present

## 2015-04-12 DIAGNOSIS — I10 Essential (primary) hypertension: Secondary | ICD-10-CM | POA: Diagnosis not present

## 2015-04-12 DIAGNOSIS — R269 Unspecified abnormalities of gait and mobility: Secondary | ICD-10-CM | POA: Diagnosis not present

## 2015-04-12 DIAGNOSIS — F329 Major depressive disorder, single episode, unspecified: Secondary | ICD-10-CM | POA: Diagnosis not present

## 2015-04-12 DIAGNOSIS — I480 Paroxysmal atrial fibrillation: Secondary | ICD-10-CM | POA: Diagnosis not present

## 2015-04-12 DIAGNOSIS — Z9181 History of falling: Secondary | ICD-10-CM | POA: Diagnosis not present

## 2015-04-12 DIAGNOSIS — S42301D Unspecified fracture of shaft of humerus, right arm, subsequent encounter for fracture with routine healing: Secondary | ICD-10-CM | POA: Diagnosis not present

## 2015-04-13 ENCOUNTER — Telehealth (HOSPITAL_COMMUNITY): Payer: Self-pay

## 2015-04-13 ENCOUNTER — Ambulatory Visit (INDEPENDENT_AMBULATORY_CARE_PROVIDER_SITE_OTHER): Payer: Medicare Other | Admitting: Family Medicine

## 2015-04-13 ENCOUNTER — Encounter: Payer: Self-pay | Admitting: Family Medicine

## 2015-04-13 VITALS — BP 156/88 | HR 90 | Temp 98.5°F | Resp 20 | Ht 66.0 in | Wt 164.5 lb

## 2015-04-13 DIAGNOSIS — E569 Vitamin deficiency, unspecified: Secondary | ICD-10-CM

## 2015-04-13 DIAGNOSIS — F32A Depression, unspecified: Secondary | ICD-10-CM

## 2015-04-13 DIAGNOSIS — R531 Weakness: Secondary | ICD-10-CM | POA: Diagnosis not present

## 2015-04-13 DIAGNOSIS — F329 Major depressive disorder, single episode, unspecified: Secondary | ICD-10-CM

## 2015-04-13 DIAGNOSIS — Z23 Encounter for immunization: Secondary | ICD-10-CM

## 2015-04-13 DIAGNOSIS — I701 Atherosclerosis of renal artery: Secondary | ICD-10-CM | POA: Diagnosis not present

## 2015-04-13 DIAGNOSIS — Z9181 History of falling: Secondary | ICD-10-CM | POA: Diagnosis not present

## 2015-04-13 DIAGNOSIS — I10 Essential (primary) hypertension: Secondary | ICD-10-CM

## 2015-04-13 DIAGNOSIS — Z0000 Encounter for general adult medical examination without abnormal findings: Secondary | ICD-10-CM

## 2015-04-13 DIAGNOSIS — I480 Paroxysmal atrial fibrillation: Secondary | ICD-10-CM | POA: Diagnosis not present

## 2015-04-13 DIAGNOSIS — E2839 Other primary ovarian failure: Secondary | ICD-10-CM | POA: Diagnosis not present

## 2015-04-13 DIAGNOSIS — R269 Unspecified abnormalities of gait and mobility: Secondary | ICD-10-CM | POA: Diagnosis not present

## 2015-04-13 DIAGNOSIS — S42301D Unspecified fracture of shaft of humerus, right arm, subsequent encounter for fracture with routine healing: Secondary | ICD-10-CM | POA: Diagnosis not present

## 2015-04-13 DIAGNOSIS — E559 Vitamin D deficiency, unspecified: Secondary | ICD-10-CM

## 2015-04-13 LAB — BASIC METABOLIC PANEL
BUN: 23 mg/dL (ref 6–23)
CALCIUM: 9.5 mg/dL (ref 8.4–10.5)
CHLORIDE: 102 meq/L (ref 96–112)
CO2: 28 mEq/L (ref 19–32)
CREATININE: 1.07 mg/dL (ref 0.40–1.20)
GFR: 53.35 mL/min — AB (ref >60.00)
Glucose, Bld: 99 mg/dL (ref 70–99)
Potassium: 3.4 mEq/L — ABNORMAL LOW (ref 3.5–5.1)
Sodium: 141 mEq/L (ref 135–145)

## 2015-04-13 MED ORDER — SERTRALINE HCL 50 MG PO TABS: ORAL_TABLET | ORAL | Status: DC

## 2015-04-13 NOTE — Telephone Encounter (Signed)
Encounter complete. 

## 2015-04-13 NOTE — Progress Notes (Signed)
Subjective:    Patient ID: Kathy Hobbs, female    DOB: [birth year], 74 y.o.   MRN: [id number]  HPI   Patient presents for follow-up on depression with start medication, with elevated blood pressure.  HTN: Patient presents to appointment with her daughter. Her blood pressure is elevated 2 on readings. Patient states her blood pressure this morning was 130/70. 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 Patient denies any chest pain, shortness breath, visual changes or lower extremity edema. Patient does watch the salt content in her diet. She is working with physical therapy.   Depression: Patient presents to office visit with her daughter for follow-up on depression. Patient has continued taking Celexa, and started Zoloft on her last appointment. Patient is now taking 50 mg of Zoloft daily and states she is feeling much better. Her daughter states that she has seen a huge difference with her mother's interactions. She reports she has hurt her mother laugh for the first time in quite a while a few weeks ago. Patient has been wanting to go outdoors and be more interactive than prior. They report no negative side effects with started Zoloft.  Health maintenance:  Colonoscopy: Unknown hx,  Mammogram: Unknown screening. Mammogram after acute shoulder injury healed.  Immunizations: Flu indicated, unknown Tdap, awaiting records. Unknown PNA immunization. Unknown zostavax Bone density: Indicated  Past Medical History  Diagnosis Date  . Depression   . Hypertension   . Hyperlipidemia   . Urine incontinence   . Atrial fibrillation ([place])   . Breast cancer ([place])   . Vertigo    No Known Allergies  Review of Systems Negative, with the exception of above mentioned in HPI      Objective:   Physical Exam BP 160/100 mmHg  Pulse 90  Temp(Src)  98.5 F (36.9 C) (Oral)  Resp 20  Ht 5\' 6"  (1.676 m)  Wt 164 lb 8 oz (74.617 kg)  BMI 26.56 kg/m2  SpO2 97% Gen: Afebrile. No acute distress. Nontoxic in appearance, well-developed, well-nourished, Caucasian female. Appears well today. Smiling. HENT: AT. [place].  MMM.  Eyes:Pupils Equal Round Reactive to light, Extraocular movements intact,  Conjunctiva without redness, discharge or icterus. CV: RRR no murmurs appreciated, no lower extremity edema. Chest: CTAB, no wheeze or crackles Psych: Normal affect, dress and demeanor. Normal speech. Normal thought content and judgment. More interactive, more conversive, smiles.     Assessment & Plan:  Vitamin deficiency/estrogen deficiency - Continue vitamin D supplementation 50,000 units weekly, and then start [phone] units daily. - DG Bone Density; Future  Hypertension: Patient is to continue monitoring her  blood pressure at home. She is to call in to make an appointment to discuss her blood pressure if her home blood pressures remain above [id number] systolic or above 90 diastolic. Patient is continue to do low salt diet. She is slowly increasing her exercise through physical therapy.  Depression - Patient doing well with the addition to Zoloft to her Celexa. - Continue both Celexa and Zoloft, refills for Zoloft 50 mg given today. Patient counseled on 50 mg daily daily which is now one pill  - sertraline (ZOLOFT) 50 MG tablet; 50 mg QD.  Dispense: 30 tablet; Refill: 2  Need for prophylactic vaccination and inoculation against influenza - Flu Vaccine QUAD 36+ mos PF IM (Fluarix & Fluzone Quad PF)  Health care maintenance - Flu shot administered today and DEXA scan ordered   > 25 minutes spent with patient, >50% of time spent face to face counseling patient and coordinating care.

## 2015-04-13 NOTE — Patient Instructions (Signed)
If blood pressure above 140/90 at home I will need to see you to discuss. Low salt diet.

## 2015-04-14 ENCOUNTER — Other Ambulatory Visit: Payer: Self-pay | Admitting: Family Medicine

## 2015-04-14 ENCOUNTER — Telehealth: Payer: Self-pay | Admitting: Family Medicine

## 2015-04-14 DIAGNOSIS — I10 Essential (primary) hypertension: Secondary | ICD-10-CM

## 2015-04-14 DIAGNOSIS — E876 Hypokalemia: Secondary | ICD-10-CM

## 2015-04-14 MED ORDER — SPIRONOLACTONE 25 MG PO TABS: 25.0000 mg | ORAL_TABLET | Freq: Every day | ORAL | Status: DC

## 2015-04-14 NOTE — Telephone Encounter (Signed)
Spoke with patient daughter reviewed lab results. She states her mom's BP before PT yesterday was 140/80 according to the physical therapist. Explained to patient daughter we might alter her BP meds to compensate for low K+ told her I would call her back after reviewing with Dr Raoul Pitch.

## 2015-04-14 NOTE — Telephone Encounter (Signed)
Reviewed instructions with patient daughter. Scheduled appt for next week. Patient daughter verbalized understanding of all instructions.

## 2015-04-14 NOTE — Telephone Encounter (Signed)
Patient is to increase her spirolactone to 25 mg daily (was taking half tab, which was 12.5). She should be encouraged to eat potassium enriched foods bananas, yogurt, sweet potatoes, tomato sauces etc. Avoid green leafy veggies since she is on coumadin and they can alter her levels.  - We will need to recheck potassium/BP/kidney function in 1 week by lab/nurse appt only.  If still mildly low after these changes, may need to add a low dose potassium supplement.

## 2015-04-14 NOTE — Telephone Encounter (Signed)
Left message for patient daughter to call back to review instructions for medication change and diet.

## 2015-04-14 NOTE — Telephone Encounter (Signed)
Please call pt or daughter:  - her labs looked good, with the exception of mildly low Potassium. Please ask her what her BP was yesterday prior to her PT session. Depending her BP I would like to alter meds a little to help retain some potassium.  - We will call her back once I know her BP with decision.

## 2015-04-14 NOTE — Telephone Encounter (Signed)
Left message for patient daughter to call to review labs and instructions.

## 2015-04-17 ENCOUNTER — Ambulatory Visit (INDEPENDENT_AMBULATORY_CARE_PROVIDER_SITE_OTHER)
Admission: RE | Admit: 2015-04-17 | Discharge: 2015-04-17 | Disposition: A | Discharge status: Home / Self Care | Payer: Medicare Other | Source: Ambulatory Visit | Attending: Family Medicine | Admitting: Family Medicine

## 2015-04-17 DIAGNOSIS — I480 Paroxysmal atrial fibrillation: Secondary | ICD-10-CM | POA: Diagnosis not present

## 2015-04-17 DIAGNOSIS — S42301D Unspecified fracture of shaft of humerus, right arm, subsequent encounter for fracture with routine healing: Secondary | ICD-10-CM | POA: Diagnosis not present

## 2015-04-17 DIAGNOSIS — F329 Major depressive disorder, single episode, unspecified: Secondary | ICD-10-CM | POA: Diagnosis not present

## 2015-04-17 DIAGNOSIS — E2839 Other primary ovarian failure: Secondary | ICD-10-CM

## 2015-04-17 DIAGNOSIS — M810 Age-related osteoporosis without current pathological fracture: Secondary | ICD-10-CM

## 2015-04-17 DIAGNOSIS — R269 Unspecified abnormalities of gait and mobility: Secondary | ICD-10-CM | POA: Diagnosis not present

## 2015-04-17 DIAGNOSIS — I10 Essential (primary) hypertension: Secondary | ICD-10-CM | POA: Diagnosis not present

## 2015-04-17 DIAGNOSIS — E569 Vitamin deficiency, unspecified: Secondary | ICD-10-CM

## 2015-04-17 DIAGNOSIS — Z9181 History of falling: Secondary | ICD-10-CM | POA: Diagnosis not present

## 2015-04-18 ENCOUNTER — Ambulatory Visit (HOSPITAL_COMMUNITY)
Admission: RE | Admit: 2015-04-18 | Discharge: 2015-04-18 | Disposition: A | Discharge status: Home / Self Care | Payer: Medicare Other | Source: Ambulatory Visit | Attending: Internal Medicine | Admitting: Internal Medicine

## 2015-04-18 DIAGNOSIS — I4891 Unspecified atrial fibrillation: Secondary | ICD-10-CM | POA: Insufficient documentation

## 2015-04-18 DIAGNOSIS — Z8249 Family history of ischemic heart disease and other diseases of the circulatory system: Secondary | ICD-10-CM | POA: Insufficient documentation

## 2015-04-18 DIAGNOSIS — I10 Essential (primary) hypertension: Secondary | ICD-10-CM | POA: Insufficient documentation

## 2015-04-18 DIAGNOSIS — R5383 Other fatigue: Secondary | ICD-10-CM | POA: Diagnosis not present

## 2015-04-18 DIAGNOSIS — I701 Atherosclerosis of renal artery: Secondary | ICD-10-CM | POA: Diagnosis not present

## 2015-04-18 DIAGNOSIS — R0609 Other forms of dyspnea: Secondary | ICD-10-CM | POA: Insufficient documentation

## 2015-04-18 DIAGNOSIS — I739 Peripheral vascular disease, unspecified: Secondary | ICD-10-CM | POA: Diagnosis not present

## 2015-04-18 LAB — MYOCARDIAL PERFUSION IMAGING
CSEPPHR: 93 {beats}/min
LVDIAVOL: 62 mL
LVSYSVOL: 25 mL
Rest HR: 81 {beats}/min
SDS: 2
SRS: 0
SSS: 2
TID: 1.02

## 2015-04-18 MED ORDER — TECHNETIUM TC 99M SESTAMIBI GENERIC - CARDIOLITE
32.0000 | Freq: Once | INTRAVENOUS | Status: AC | PRN
  Administered 2015-04-18: 32 via INTRAVENOUS

## 2015-04-18 MED ORDER — REGADENOSON 0.4 MG/5ML IV SOLN
0.4000 mg | Freq: Once | INTRAVENOUS | Status: AC
Start: 2015-04-18 — End: 2015-04-18
  Administered 2015-04-18: 0.4 mg via INTRAVENOUS

## 2015-04-18 MED ORDER — TECHNETIUM TC 99M SESTAMIBI GENERIC - CARDIOLITE
10.4000 | Freq: Once | INTRAVENOUS | Status: AC | PRN
  Administered 2015-04-18: 10.4 via INTRAVENOUS

## 2015-04-20 ENCOUNTER — Other Ambulatory Visit: Payer: Self-pay | Admitting: Family Medicine

## 2015-04-20 ENCOUNTER — Ambulatory Visit (INDEPENDENT_AMBULATORY_CARE_PROVIDER_SITE_OTHER): Payer: Medicare Other | Admitting: *Deleted

## 2015-04-20 DIAGNOSIS — E559 Vitamin D deficiency, unspecified: Secondary | ICD-10-CM | POA: Diagnosis not present

## 2015-04-20 DIAGNOSIS — I10 Essential (primary) hypertension: Secondary | ICD-10-CM

## 2015-04-20 DIAGNOSIS — E876 Hypokalemia: Secondary | ICD-10-CM | POA: Diagnosis not present

## 2015-04-20 LAB — BASIC METABOLIC PANEL
BUN: 18 mg/dL (ref 6–23)
CALCIUM: 9.9 mg/dL (ref 8.4–10.5)
CHLORIDE: 101 meq/L (ref 96–112)
CO2: 27 meq/L (ref 19–32)
Creatinine, Ser: 1.05 mg/dL (ref 0.40–1.20)
GFR: 54.53 mL/min — ABNORMAL LOW (ref >60.00)
GLUCOSE: 98 mg/dL (ref 70–99)
POTASSIUM: 3.6 meq/L (ref 3.5–5.1)
SODIUM: 138 meq/L (ref 135–145)

## 2015-04-20 LAB — VITAMIN D 25 HYDROXY (VIT D DEFICIENCY, FRACTURES): VITD: 36.63 ng/mL (ref 30.00–100.00)

## 2015-04-20 NOTE — Progress Notes (Signed)
Patient presents today for Blood Pressure check per Dr Raoul Pitch. After sitting 10 minutes patient VS taken. Patient had not taken her BP med this am.  Dr Raoul Pitch aware of results. Patient and daughter instructed to take home blood pressure measurements daily 2 hour after taking medications and keep a log . Report finding weekly to our office. [name] verbalized understanding.

## 2015-04-21 ENCOUNTER — Ambulatory Visit (HOSPITAL_COMMUNITY)
Admission: RE | Admit: 2015-04-21 | Discharge: 2015-04-21 | Disposition: A | Discharge status: Home / Self Care | Payer: Medicare Other | Source: Ambulatory Visit | Attending: Cardiovascular Disease | Admitting: Cardiovascular Disease

## 2015-04-21 ENCOUNTER — Ambulatory Visit (HOSPITAL_BASED_OUTPATIENT_CLINIC_OR_DEPARTMENT_OTHER)
Admission: RE | Admit: 2015-04-21 | Discharge: 2015-04-21 | Disposition: A | Discharge status: Home / Self Care | Payer: Medicare Other | Source: Ambulatory Visit | Attending: Internal Medicine | Admitting: Internal Medicine

## 2015-04-21 ENCOUNTER — Other Ambulatory Visit: Payer: Self-pay | Admitting: Internal Medicine

## 2015-04-21 ENCOUNTER — Telehealth: Payer: Self-pay | Admitting: Family Medicine

## 2015-04-21 DIAGNOSIS — I10 Essential (primary) hypertension: Secondary | ICD-10-CM | POA: Insufficient documentation

## 2015-04-21 DIAGNOSIS — I700 Atherosclerosis of aorta: Secondary | ICD-10-CM | POA: Insufficient documentation

## 2015-04-21 DIAGNOSIS — I739 Peripheral vascular disease, unspecified: Secondary | ICD-10-CM | POA: Diagnosis not present

## 2015-04-21 DIAGNOSIS — I701 Atherosclerosis of renal artery: Secondary | ICD-10-CM | POA: Diagnosis not present

## 2015-04-21 DIAGNOSIS — E785 Hyperlipidemia, unspecified: Secondary | ICD-10-CM | POA: Insufficient documentation

## 2015-04-21 DIAGNOSIS — N270 Small kidney, unilateral: Secondary | ICD-10-CM | POA: Insufficient documentation

## 2015-04-21 NOTE — Telephone Encounter (Signed)
Please call pt or daughter Kathy Hobbs): - Pts labs look good, potassium is now low normal. Would encourage to continue potassium rich foods in her diet (avoid green leafy since on coumadin).

## 2015-04-21 NOTE — Telephone Encounter (Signed)
Spoke with patient daughter reviewed lab results and dietary instructions. Daughter verbalized understanding.

## 2015-04-24 DIAGNOSIS — Z4789 Encounter for other orthopedic aftercare: Secondary | ICD-10-CM | POA: Diagnosis not present

## 2015-04-24 DIAGNOSIS — S42201D Unspecified fracture of upper end of right humerus, subsequent encounter for fracture with routine healing: Secondary | ICD-10-CM | POA: Diagnosis not present

## 2015-04-25 ENCOUNTER — Telehealth: Payer: Self-pay | Admitting: *Deleted

## 2015-04-25 DIAGNOSIS — R269 Unspecified abnormalities of gait and mobility: Secondary | ICD-10-CM | POA: Diagnosis not present

## 2015-04-25 DIAGNOSIS — I10 Essential (primary) hypertension: Secondary | ICD-10-CM | POA: Diagnosis not present

## 2015-04-25 DIAGNOSIS — R531 Weakness: Secondary | ICD-10-CM

## 2015-04-25 DIAGNOSIS — Z9181 History of falling: Secondary | ICD-10-CM | POA: Diagnosis not present

## 2015-04-25 DIAGNOSIS — S42301D Unspecified fracture of shaft of humerus, right arm, subsequent encounter for fracture with routine healing: Secondary | ICD-10-CM | POA: Diagnosis not present

## 2015-04-25 DIAGNOSIS — F329 Major depressive disorder, single episode, unspecified: Secondary | ICD-10-CM | POA: Diagnosis not present

## 2015-04-25 DIAGNOSIS — I480 Paroxysmal atrial fibrillation: Secondary | ICD-10-CM | POA: Diagnosis not present

## 2015-04-25 NOTE — Telephone Encounter (Signed)
Please call pt daughter Kathy Hobbs: - I am seeing patients throughout the day, which makes it difficult to call. She should be encouraged to leave message with the nurse that I may respond to, try a mychart message that I may respond to  or make an appointment to discuss.

## 2015-04-25 NOTE — Telephone Encounter (Signed)
Patient daughter Joseph Art called requesting a call form Dr Raoul Pitch regarding her mother. Says she just has a few things she needs to talk to you about.  Please Advise.

## 2015-04-26 ENCOUNTER — Ambulatory Visit (INDEPENDENT_AMBULATORY_CARE_PROVIDER_SITE_OTHER): Payer: Medicare Other | Admitting: Internal Medicine

## 2015-04-26 ENCOUNTER — Encounter: Payer: Self-pay | Admitting: Internal Medicine

## 2015-04-26 VITALS — BP 145/90 | HR 81 | Ht 66.0 in | Wt 162.0 lb

## 2015-04-26 DIAGNOSIS — I701 Atherosclerosis of renal artery: Secondary | ICD-10-CM

## 2015-04-26 DIAGNOSIS — I739 Peripheral vascular disease, unspecified: Secondary | ICD-10-CM | POA: Diagnosis not present

## 2015-04-26 DIAGNOSIS — Z7901 Long term (current) use of anticoagulants: Secondary | ICD-10-CM | POA: Diagnosis not present

## 2015-04-26 DIAGNOSIS — I480 Paroxysmal atrial fibrillation: Secondary | ICD-10-CM

## 2015-04-26 DIAGNOSIS — W19XXXS Unspecified fall, sequela: Secondary | ICD-10-CM

## 2015-04-26 MED ORDER — VALSARTAN 320 MG PO TABS: 320.0000 mg | ORAL_TABLET | Freq: Every day | ORAL | Status: DC

## 2015-04-26 NOTE — Progress Notes (Signed)
OFFICE NOTE  Chief Complaint:  Follow-up studies  Primary Care Physician: Howard Pouch, DO  HPI:  Kathy Hobbs is a pleasant 74 year old female who is kindly referred to me for establishing cardiac care. Kathy Hobbs has not previously seen a cardiologist, but has had several years of atrial fibrillation. Per her report she says it comes and goes although she was noted to be in A. fib today. She was placed on warfarin by her primary care provider in [place] prior to moving up to live with her daughter. Her INR was recently checked by her PCP at 3.1 and mild adjustments were made to her medicines. It was requested that we follow her warfarin and manage it in our office. Kathy Hobbs also has a significant history of PAD. She recently had carotid Dopplers apparently by her neurologist in [place] which showed a 40% right carotid artery stenosis. She also has a history of PAD and peripheral stents as well as bilateral renal artery stenosis status post PCI about one year ago by Dr. Sullivan Lone in [facility]. She was previously seeing Dr. Jackalyn Lombard, with the [facility] in [place]. She currently reports fatigue and some shortness of breath with exertion. She denies any chest pain. She's never had an ischemia evaluation to her knowledge. Recently he's been having some falls and in fact fell and fractured her right humerus. That is very slow to heal. She is also reporting some pain when walking and swelling of her legs.  Kathy Hobbs returns today for follow-up. Unfortunately we have not been able to locate records from her cardiologist in [facility] [place]. She subsequently underwent lower extremity arterial Dopplers as well as renal artery Dopplers here. This demonstrated patent lower extremity stents as well as patent bilateral renal stents. She underwent stress testing as well which demonstrated no ischemia and LVEF which was preserved. She seems to be tolerating warfarin without  bleeding problems although has been having some falls. In fact she fell twice last week. I am concerned about this and her daughter apparently is working on trying to come up with the cause of her falls. Again in the office today, we discussed the possibility of a Watchman device which is a left atrial appendage occluder if she were not to be candidate for warfarin therapy long-term.   PMHx:  Past Medical History  Diagnosis Date  . Depression   . Hypertension   . Hyperlipidemia   . Urine incontinence   . Atrial fibrillation ([place])   . Breast cancer ([place])   . Vertigo     Past Surgical History  Procedure Laterality Date  . Breast biopsy    . Breast lumpectomy    . Renal artery stent Bilateral   . Femoral artery stent      ? pt states "Stent in my leg"    FAMHx:  Family History  Problem Relation Age of Onset  . Heart attack Father   . Alcohol abuse Brother   . Breast cancer Paternal Grandmother   . Depression Mother   . Alcoholism Brother   . Depression Brother     SOCHx:   reports that she has never smoked. She has never used smokeless tobacco. She reports that she does not drink alcohol or use illicit drugs.  ALLERGIES:  No Known Allergies  ROS: A comprehensive review of systems was negative.  HOME MEDS: Current Outpatient Prescriptions  Medication Sig Dispense Refill  . citalopram (CELEXA) 20 MG tablet Take 1 tablet (20 mg total)  by mouth daily. 90 tablet 1  . sertraline (ZOLOFT) 50 MG tablet 50 mg QD. 30 tablet 2  . spironolactone (ALDACTONE) 25 MG tablet Take 1 tablet (25 mg total) by mouth daily. 90 tablet 0  . Vitamin D, Ergocalciferol, (DRISDOL) 50000 UNITS CAPS capsule Take 1 capsule (50,000 Units total) by mouth every 7 (seven) days. 12 capsule 0  . warfarin (COUMADIN) 1 MG tablet Take 1 tablet (1 mg total) by mouth daily. 60 tablet 0  . warfarin (COUMADIN) 2 MG tablet Take 1 tablet (2 mg total) by mouth daily. 30 tablet 0  . valsartan (DIOVAN) 320 MG tablet  Take 1 tablet (320 mg total) by mouth daily. 30 tablet 6   No current facility-administered medications for this visit.    LABS/IMAGING: No results found for this or any previous visit (from the past 48 hour(s)). No results found.  WEIGHTS: Wt Readings from Last 3 Encounters:  04/26/15 162 lb (73.483 kg)  04/18/15 163 lb (73.936 kg)  04/13/15 164 lb 8 oz (74.617 kg)    VITALS: BP 145/90 mmHg  Pulse 81  Ht 5\' 6"  (1.676 m)  Wt 162 lb (73.483 kg)  BMI 26.16 kg/m2  EXAM: Deferred  EKG: Deferred  ASSESSMENT: 1. Atrial fibrillation possibly paroxysmal, asymptomatic- CHADSVASC 3 2. Hypertension-controlled 3. PAD-status post peripheral stenting - normal ABI's and patent stents (04/2015) 4. Bilateral renal artery stenosis status post bilateral stenting (patent stents 04/2015) 5. Carotid artery disease with 40% R ICA stenosis 6. History of dyslipidemia-not on statin 7. Frequent falls  PLAN: 1.   Kathy Hobbs had a negative nuclear stress test with normal LV function. Her peripheral and renal Dopplers demonstrate patent stents without any stenosis and normal ABIs. She continues to have some falls which will need to monitor closely. She may not be a candidate for long-term anticoagulation on warfarin. One option might be a Watchman LAA occluder device. In addition, her blood pressure was markedly elevated today 174/110. She had taken her medicine. I rechecked the blood pressure and it came down to 145/90. This is in line with some of the readings that they've had at home indicating blood pressure has been more elevated. I would like to switch her to Diovan 320 mg daily from losartan 100 mg daily. Hopefully this will give her a little bit better blood pressure control. We'll plan to have her follow-up with Erasmo Downer our pharmacist for blood pressure check in a few weeks. She also has scheduled follow-up with her primary care provider in February.  Pixie Casino, MD, [facility] Attending  Cardiologist [place] 04/26/2015, 12:59 PM

## 2015-04-26 NOTE — Patient Instructions (Addendum)
Please have fasting lab work done to check cholesterol  There is a lab on the 1st floor of Dr. Lysbeth Penner building in [address] No appointment is needed.   Your physician has recommended you make the following change in your medication...  1. STOP losartan  2. START valsartan once daily for blood pressure.    Your physician recommends that you schedule a follow-up appointment in: 2 weeks with Erasmo Downer (pharmacist) for blood pressure check  Please bring your blood pressure cuff and any readings you have to this visit  Your physician wants you to follow-up in: 6 months with Dr. Debara Pickett. You will receive a reminder letter in the mail two months in advance. If you don't receive a letter, please call our office to schedule the follow-up appointment.

## 2015-04-26 NOTE — Telephone Encounter (Signed)
Needs referral to Rheumotology. Had 2 falls this week.  She is being seen at [facility] for her arm now daughter would like to know if we can order a PT evaluation at the same facility.

## 2015-04-28 DIAGNOSIS — F329 Major depressive disorder, single episode, unspecified: Secondary | ICD-10-CM | POA: Diagnosis not present

## 2015-04-28 DIAGNOSIS — R269 Unspecified abnormalities of gait and mobility: Secondary | ICD-10-CM | POA: Diagnosis not present

## 2015-04-28 DIAGNOSIS — Z9181 History of falling: Secondary | ICD-10-CM | POA: Diagnosis not present

## 2015-04-28 DIAGNOSIS — I480 Paroxysmal atrial fibrillation: Secondary | ICD-10-CM | POA: Diagnosis not present

## 2015-04-28 DIAGNOSIS — I10 Essential (primary) hypertension: Secondary | ICD-10-CM | POA: Diagnosis not present

## 2015-04-28 DIAGNOSIS — S42301D Unspecified fracture of shaft of humerus, right arm, subsequent encounter for fracture with routine healing: Secondary | ICD-10-CM | POA: Diagnosis not present

## 2015-05-01 NOTE — Telephone Encounter (Signed)
Referrals to PT and Rheumatology placed.

## 2015-05-01 NOTE — Progress Notes (Signed)
Patient ID: Kathy Hobbs, female   DOB: 06/26/1940, 74 y.o.   MRN: [id number]  Howard Pouch DO

## 2015-05-01 NOTE — Telephone Encounter (Signed)
Diane aware.

## 2015-05-02 ENCOUNTER — Telehealth: Payer: Self-pay | Admitting: Family Medicine

## 2015-05-02 DIAGNOSIS — M810 Age-related osteoporosis without current pathological fracture: Secondary | ICD-10-CM | POA: Insufficient documentation

## 2015-05-02 DIAGNOSIS — S42391S Other fracture of shaft of right humerus, sequela: Secondary | ICD-10-CM | POA: Diagnosis not present

## 2015-05-02 MED ORDER — ALENDRONATE SODIUM 70 MG PO TABS: 70.0000 mg | ORAL_TABLET | ORAL | Status: DC

## 2015-05-02 NOTE — Telephone Encounter (Signed)
Please call pt/daughter: - Mrs. Gongora's bone density scan resulted with osteoporosis (worse in the right hip). She needs to continue vit d supplement, and then after supplement take at least 800 in daily. We also need to start a medication called fosamax to help prevent progression and/or fracture risk.  I have called this into her pharmacy. Pt must take  medication as directed (full glass of water/otherwise empty stomach/and remain upright for at least 30 minutes after). This is taken once a week only.

## 2015-05-02 NOTE — Telephone Encounter (Signed)
Patient's daughter aware.  Pt was given detailed instructions on how to take medications.  Pt's daughter had no further questions at this time.

## 2015-05-02 NOTE — Telephone Encounter (Signed)
LMOM for pt's daughter to CB. 

## 2015-05-03 ENCOUNTER — Ambulatory Visit (INDEPENDENT_AMBULATORY_CARE_PROVIDER_SITE_OTHER): Payer: Medicare Other | Admitting: Pharmacist Clinician (PhC)/ Clinical Pharmacy Specialist

## 2015-05-03 VITALS — BP 138/76 | HR 84

## 2015-05-03 DIAGNOSIS — Z7901 Long term (current) use of anticoagulants: Secondary | ICD-10-CM

## 2015-05-03 DIAGNOSIS — E785 Hyperlipidemia, unspecified: Secondary | ICD-10-CM | POA: Diagnosis not present

## 2015-05-03 DIAGNOSIS — I480 Paroxysmal atrial fibrillation: Secondary | ICD-10-CM

## 2015-05-03 LAB — POCT INR: INR: 2.1

## 2015-05-03 NOTE — Progress Notes (Signed)
Pt has recently switched from losartan 100 mg to valsartan 320 mg.  Has not yet noticed an appreciable decrease in BP, but has been eating out over the holiday week, and only made the change 5-6 days ago.  Asked patient to keep a BP log and bring with her to next INR check in 4 weeks.  Home BP cuff read within 10 points systolic, 15 diastolic.

## 2015-05-03 NOTE — Patient Instructions (Signed)
   HOW TO TAKE YOUR BLOOD PRESSURE: . Rest 5 minutes before taking your blood pressure. .  Don't smoke or drink caffeinated beverages for at least 30 minutes before. . Take your blood pressure before (not after) you eat. . Sit comfortably with your back supported and both feet on the floor (don't cross your legs). . Elevate your arm to heart level on a table or a desk. . Use the proper sized cuff. It should fit smoothly and snugly around your bare upper arm. There should be enough room to slip a fingertip under the cuff. The bottom edge of the cuff should be 1 inch above the crease of the elbow. . Ideally, take 3 measurements at one sitting and record the average.  

## 2015-05-04 LAB — LIPID PANEL
CHOL/HDL RATIO: 5.5 ratio — AB (ref <=5.0)
Cholesterol: 311 mg/dL — ABNORMAL HIGH (ref 125–200)
HDL: 57 mg/dL (ref >=46)
LDL CALC: 228 mg/dL — AB (ref <130)
TRIGLYCERIDES: 131 mg/dL (ref <150)
VLDL: 26 mg/dL (ref <30)

## 2015-05-05 DIAGNOSIS — S42391S Other fracture of shaft of right humerus, sequela: Secondary | ICD-10-CM | POA: Diagnosis not present

## 2015-05-08 DIAGNOSIS — S42391S Other fracture of shaft of right humerus, sequela: Secondary | ICD-10-CM | POA: Diagnosis not present

## 2015-05-10 ENCOUNTER — Telehealth: Payer: Self-pay | Admitting: Internal Medicine

## 2015-05-10 DIAGNOSIS — S42391S Other fracture of shaft of right humerus, sequela: Secondary | ICD-10-CM | POA: Diagnosis not present

## 2015-05-10 NOTE — Telephone Encounter (Signed)
Records were received as requested from [facility] for Dr Debara Pickett.  Records received on 05/03/15 and given to Dr Debara Pickett.  Dr Debara Pickett reviewed.

## 2015-05-22 DIAGNOSIS — G629 Polyneuropathy, unspecified: Secondary | ICD-10-CM | POA: Diagnosis not present

## 2015-05-22 DIAGNOSIS — R899 Unspecified abnormal finding in specimens from other organs, systems and tissues: Secondary | ICD-10-CM | POA: Diagnosis not present

## 2015-05-23 DIAGNOSIS — S42201D Unspecified fracture of upper end of right humerus, subsequent encounter for fracture with routine healing: Secondary | ICD-10-CM | POA: Diagnosis not present

## 2015-05-25 ENCOUNTER — Telehealth: Payer: Self-pay | Admitting: Family Medicine

## 2015-05-25 DIAGNOSIS — R269 Unspecified abnormalities of gait and mobility: Secondary | ICD-10-CM

## 2015-05-25 NOTE — Telephone Encounter (Signed)
Patient is requesting new neurology referral to [facility]. Records from  Dr. N[facility] have been scanned in to patient's chart.

## 2015-05-25 NOTE — Telephone Encounter (Signed)
Referral placed.

## 2015-05-31 ENCOUNTER — Encounter: Payer: Medicare Other | Admitting: Pharmacist Clinician (PhC)/ Clinical Pharmacy Specialist

## 2015-05-31 ENCOUNTER — Other Ambulatory Visit: Payer: Medicare Other

## 2015-06-06 ENCOUNTER — Encounter: Payer: Self-pay | Admitting: Family Medicine

## 2015-06-08 ENCOUNTER — Encounter: Payer: Self-pay | Admitting: Cardiology

## 2015-06-08 ENCOUNTER — Telehealth: Payer: Self-pay | Admitting: *Deleted

## 2015-06-08 DIAGNOSIS — E785 Hyperlipidemia, unspecified: Secondary | ICD-10-CM

## 2015-06-08 DIAGNOSIS — S42391S Other fracture of shaft of right humerus, sequela: Secondary | ICD-10-CM | POA: Diagnosis not present

## 2015-06-08 MED ORDER — ATORVASTATIN CALCIUM 80 MG PO TABS: 80.0000 mg | ORAL_TABLET | Freq: Every day | ORAL | Status: DC

## 2015-06-08 NOTE — Telephone Encounter (Signed)
-----   Message from Pixie Casino, MD sent at 05/04/2015  9:03 AM EST ----- Labs may indicate she has FH (she is not currently on a statin) - would recommend starting lipitor 80 mg QHS. Recheck lipid profile in 3 months. If she is not adequately controlled on max tolerated statin dose or has side-effects, may be a candidate for Repatha.  Dr. Lemmie Evens

## 2015-06-08 NOTE — Telephone Encounter (Signed)
Pt's daughter is returning Debra's call. Please f/u   Thanks

## 2015-06-08 NOTE — Telephone Encounter (Signed)
This encounter was created in error - please disregard.

## 2015-06-08 NOTE — Telephone Encounter (Signed)
Spoke with pt dtr, new script called to [facility] in [place]. Lab paperwork mailed to the pt for lab draw in [place]

## 2015-06-15 DIAGNOSIS — S42391S Other fracture of shaft of right humerus, sequela: Secondary | ICD-10-CM | POA: Diagnosis not present

## 2015-06-28 DIAGNOSIS — S42391S Other fracture of shaft of right humerus, sequela: Secondary | ICD-10-CM | POA: Diagnosis not present

## 2015-07-13 ENCOUNTER — Encounter: Payer: Self-pay | Admitting: Family Medicine

## 2015-07-13 ENCOUNTER — Ambulatory Visit (INDEPENDENT_AMBULATORY_CARE_PROVIDER_SITE_OTHER): Payer: Medicare Other | Admitting: Family Medicine

## 2015-07-13 VITALS — BP 109/68 | HR 89 | Temp 98.1°F | Resp 20 | Wt 164.0 lb

## 2015-07-13 DIAGNOSIS — F329 Major depressive disorder, single episode, unspecified: Secondary | ICD-10-CM | POA: Diagnosis not present

## 2015-07-13 DIAGNOSIS — Z1239 Encounter for other screening for malignant neoplasm of breast: Secondary | ICD-10-CM

## 2015-07-13 DIAGNOSIS — E559 Vitamin D deficiency, unspecified: Secondary | ICD-10-CM

## 2015-07-13 DIAGNOSIS — Z1231 Encounter for screening mammogram for malignant neoplasm of breast: Secondary | ICD-10-CM

## 2015-07-13 DIAGNOSIS — Z0000 Encounter for general adult medical examination without abnormal findings: Secondary | ICD-10-CM

## 2015-07-13 DIAGNOSIS — M810 Age-related osteoporosis without current pathological fracture: Secondary | ICD-10-CM

## 2015-07-13 DIAGNOSIS — Z23 Encounter for immunization: Secondary | ICD-10-CM

## 2015-07-13 DIAGNOSIS — K219 Gastro-esophageal reflux disease without esophagitis: Secondary | ICD-10-CM | POA: Diagnosis not present

## 2015-07-13 DIAGNOSIS — F32A Depression, unspecified: Secondary | ICD-10-CM

## 2015-07-13 MED ORDER — OMEPRAZOLE 20 MG PO CPDR: 20.0000 mg | DELAYED_RELEASE_CAPSULE | Freq: Every day | ORAL | Status: DC

## 2015-07-13 MED ORDER — CITALOPRAM HYDROBROMIDE 20 MG PO TABS: 20.0000 mg | ORAL_TABLET | Freq: Every day | ORAL | Status: DC

## 2015-07-13 MED ORDER — SERTRALINE HCL 50 MG PO TABS: ORAL_TABLET | ORAL | Status: DC

## 2015-07-13 NOTE — Patient Instructions (Addendum)

## 2015-07-13 NOTE — Progress Notes (Signed)
Patient ID: Kathy Hobbs, female   DOB: 1940/12/22, 75 y.o.   MRN: [id number]   Subjective:    Patient ID: Kathy Hobbs, female    DOB: 12/15/1940, 75 y.o.   MRN: [id number]  HPI  Patient presents for follow-up on her depression, with her husband today, with the complaint of greater than one-month cough.  Cough: Patient has been reports that approximately one month ago he had noticed that his wife was coughing more frequently after attempting to swallow both food and liquid. She states she doesn't choke on the items, but feels a "tickle", mostly on the right side of her throat, after swallowing. They report that this occurs at least 3 times a day. She states when she does have to cough to clear the tickle sensation, and it is dry. She does admit to feeling like she has increased allergies/postnasal drip since moving to [place]. She also endorses coughing at Nighttime. She denies a sour or bad taste in her throat upon awakening. She denies any increase in heartburn.  Depression: Patient presents to office visit with her husband for follow-up on depression. Patient has continued taking Celexa, and Zoloft. She reports she is feeling well on this medication. She is in need of refills today. They report no negative side effects with started Zoloft. She states she is happy, she is now living with her husband, they despite a home here. She is no longer living with her daughter here. She does plan to keep her home in [place] as well.  Health maintenance:  Colonoscopy: Unknown hx--> will offer colo-guard next visit, coumadin therapy.  Mammogram: mammogram ordered.  Immunizations: Flu UTD 2016, Tdap administered today. Will offer PNA vac series start next appt. immunization.  Unknown zostavac. Bone density: 2016, every 2-3 yr screen. Osteoporosis--> started fosamax  Of note: Patient is now established with neurology, urology, rheumatology, cardiology, orthopedics and continuing physical  therapy.  Past Medical History  Diagnosis Date  . Depression   . Hypertension   . Hyperlipidemia   . Urine incontinence   . Atrial fibrillation ([place])   . Breast cancer ([place])   . Vertigo    No Known Allergies  Review of Systems Negative, with the exception of above mentioned in HPI      Objective:   Physical Exam BP 109/68 mmHg  Pulse 89  Temp(Src) 98.1 F (36.7 C)  Resp 20  Wt 164 lb (74.39 kg)  SpO2 95% Gen: Afebrile. No acute distress. Nontoxic in appearance, well-developed, well-nourished, Caucasian female. Appears well today. Smiling. HENT: AT. [place].  MMM, no oral lesions. Throat without erythema, lesions or exudates. Eyes:Pupils Equal Round Reactive to light, Extraocular movements intact,  Conjunctiva without redness, discharge or icterus. CV: Irregularly irregular rhythm. no lower extremity edema. No JVD. Chest: CTAB, no wheeze or crackles Psych: Normal affect, dress and demeanor. Normal speech. Normal thought content and judgment.      Assessment & Plan:  Depression - Stable - Refills on depression meds. Patient is feeling good on this regimen. - citalopram (CELEXA) 20 MG tablet; Take 1 tablet (20 mg total) by mouth daily.  Dispense: 90 tablet; Refill: 1 - sertraline (ZOLOFT) 50 MG tablet; 50 mg QD.  Dispense: 90 tablet; Refill: 1  Vitamin D deficiency/osteoporosis - Patient completed prescribed supplementation.  - She is to continue over-the-counter supplementation, advised 1000 units daily. - Continue weekly Fosamax  Gastroesophageal reflux disease without esophagitis/cough - Patient's description of mild cough, especially when lying flat sounds of possible reflux related.  She is reporting a "tickle" in her throat that seems to occur after both food and liquid a few times a day. Uncertain if this is related to reflux, possible diverticula versus dysphagia/aclasia. Discussed with patient to start PPI, follow GERD diet which was provided to her today, and discuss  symptoms with her neurologist which she sees next week. She may benefit from a swallow study if felt to be true dysphagia. - omeprazole (PRILOSEC) 20 MG capsule; Take 1 capsule (20 mg total) by mouth daily.  Dispense: 30 capsule; Refill: 2 - Consider starting daily over-the-counter antihistamine if able to tolerate. - Follow-up 8 weeks  Breast cancer screening, high risk patient - Breast cancer history in paternal grandmother, unknown patient's personal history of prior mammograms. - MM Digital Screening; Future  Need for prophylactic vaccination with combined diphtheria-tetanus-pertussis (DTP) vaccine - Tdap vaccine greater than or equal to 7yo IM  Health maintenance:  Colonoscopy: Unknown hx--> will offer colo-guard next visit, coumadin therapy.  Mammogram: mammogram ordered.  Immunizations: Flu UTD 2016, Tdap administered today. Will offer PNA vac series start next appt. immunization.  Unknown zostavac. Bone density: 2016, every 2-3 yr screen. Osteoporosis--> started fosamax  8-12 week follow-up GERD/cough and depression. Will offer pneumonia vaccination and Cologuard at that time.   > 25 minutes spent with patient, >50% of time spent face to face counseling patient and coordinating care.

## 2015-07-17 ENCOUNTER — Telehealth: Payer: Self-pay | Admitting: Pharmacist Clinician (PhC)/ Clinical Pharmacy Specialist

## 2015-07-17 NOTE — Telephone Encounter (Signed)
New Message  Pt requested to speak w/ Kristen. Please call back and discuss.

## 2015-07-17 NOTE — Telephone Encounter (Signed)
Past due for INR, appt set

## 2015-07-19 ENCOUNTER — Encounter: Payer: Self-pay | Admitting: Neurology

## 2015-07-19 ENCOUNTER — Ambulatory Visit (INDEPENDENT_AMBULATORY_CARE_PROVIDER_SITE_OTHER): Payer: Medicare Other | Admitting: Neurology

## 2015-07-19 ENCOUNTER — Ambulatory Visit (INDEPENDENT_AMBULATORY_CARE_PROVIDER_SITE_OTHER): Payer: Medicare Other | Admitting: Pharmacist Clinician (PhC)/ Clinical Pharmacy Specialist

## 2015-07-19 VITALS — BP 140/80 | HR 87 | Wt 163.1 lb

## 2015-07-19 DIAGNOSIS — G959 Disease of spinal cord, unspecified: Secondary | ICD-10-CM

## 2015-07-19 DIAGNOSIS — R292 Abnormal reflex: Secondary | ICD-10-CM | POA: Diagnosis not present

## 2015-07-19 DIAGNOSIS — I480 Paroxysmal atrial fibrillation: Secondary | ICD-10-CM

## 2015-07-19 DIAGNOSIS — R261 Paralytic gait: Secondary | ICD-10-CM | POA: Diagnosis not present

## 2015-07-19 DIAGNOSIS — Z7901 Long term (current) use of anticoagulants: Secondary | ICD-10-CM | POA: Diagnosis not present

## 2015-07-19 LAB — POCT INR: INR: 1.8

## 2015-07-19 NOTE — Progress Notes (Signed)
[facility] Clinic Note - Initial Visit   Date: 07/19/2015  Kathy Hobbs MRN: [id number] DOB: 10-02-1940   Dear Dr. Raoul Pitch:  Thank you for your kind referral of Kathy Hobbs for consultation of gait abnormality. Although her history is well known to you, please allow [place] to reiterate it for the purpose of our medical record. The patient was accompanied to the clinic by daughter, Kathy Hobbs, who also provides collateral information.     History of Present Illness: Kathy Hobbs is a 75 y.o. right-handed Caucasian female with hypertension, hyperlipidemia, atrial fibrillation on coumadin, history of right breast cancer s/p lumpectomy and radiation, depression, osteoporosis, peripheral vascular disease (s/p renal stent) and GERD presenting for evaluation of falls and gait instability.    In December 2015, she had renal artery stenting and since then has become increasingly sedentary.  She reports that her potassium had been very low and she would get very fatigued leading to ER visit.  She had several visits for hypokalemia and was given infusion and supplemented with PO pills until April 2016.  During the sping of 2016, she was very depressed and would stay in bed or a chair all day.  She was still able to walk to the bath room or go out to eat, but was not motivated to do anything else.  She gradually had difficulty getting up out of chairs and sustained a fall trying to get out of a bathtub.  In August, she had a fall in her garage while turning.  In September 2016, she had a fall while closing the door of her truck and fell backwards.  She tried to break her fall and fractured her humerus which was treated conservatively. Nearly all of her falls are backwards and not preceded by lightheadedness.  She had had four falls since September, and continues to walk independently.  Daughter states that she shuffles her feet and does not lift them.  No vivid dreams or  constipation.  She reports have shakes of the hands and legs, it can occur at rest or when doing something.   She moved from [place] in September 2016. Apparently, she was not getting the best medical care in [place] and when she moved here, she was found to have vitamin B12 deficiency and a severe UTI.  She was treated appropriately and also started going to PT for the past two months and feels that her balance and leg strength is a little better.  Her motivation is improved now that she is on Celexa and Zoloft and feels that her mood is great.   She complains of lightheadedness, especially when getting up in the morning. She does not have leg pain, numbness/tingling.  She endorses swelling of the legs.   She did not get chemotherapy for her breast cancer. No history of diabetes or alcohol use.   She was seeing Kathy Hobbs at [facility] for the same complaint and underwent extensive testing including electrodiagnostic testing, US carotids, TCD, and serology testing.  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  Carotid ultrasound showed mild to moderate carotid disease bilaterally, TCD and lab testing was essentially normal.  Due to dsDNA antibody positivity, she was evaluated by rheumatology who said that she did not have lupus and was likely false positive.    Out-side paper records, electronic medical record, and images have been reviewed where available and summarized as:  NCS/EMG of the lower extremities performed by  Kathy Hobbs at [facility]  03/27/2015: 1.  Developing peripheral neuropathy 2.  Mild evidence of a lumbar polyradiculoneuropathy, right more than left.  US carotids 03/22/2015:  Mild to moderate plaque bilaterally TDC 03/22/2015:  Normal  Labs 04/09/2015:  TSH 3.335, T4 8.2, vitamin B12 >2000, folate >20, ANA negative, SPEP with IFE no M protein, AChR antibodies negative, copper 127, ENA  neg, vitamin D 27, vitamin E 20  Lab Results  Component Value Date   TSH 3.334 03/08/2015   Lab Results  Component Value Date   VITAMINB12 >2000* 03/08/2015   Lab Results  Component Value Date   HGBA1C 5.8* 03/08/2015    Past Medical History  Diagnosis Date  . Depression   . Hypertension   . Hyperlipidemia   . Urine incontinence   . Atrial fibrillation ([place])   . Breast cancer ([place])   . Vertigo   . Right humeral fracture 2016    Past Surgical History  Procedure Laterality Date  . Breast biopsy    . Breast lumpectomy    . Renal artery stent Bilateral   . Femoral artery stent      ? pt states "Stent in my leg"     Medications:  Outpatient Encounter Prescriptions as of 07/19/2015  Medication Sig  . alendronate (FOSAMAX) 70 MG tablet Take 1 tablet (70 mg total) by mouth every 7 (seven) days. Take with a full glass of water on an empty stomach.  Marland Kitchen atorvastatin (LIPITOR) 80 MG tablet Take 1 tablet (80 mg total) by mouth daily.  . cholecalciferol (VITAMIN D) 1000 units tablet Take 1,000 Units by mouth daily.  . citalopram (CELEXA) 20 MG tablet Take 1 tablet (20 mg total) by mouth daily.  Marland Kitchen omeprazole (PRILOSEC) 20 MG capsule Take 1 capsule (20 mg total) by mouth daily.  . sertraline (ZOLOFT) 50 MG tablet 50 mg QD.  Marland Kitchen spironolactone (ALDACTONE) 25 MG tablet Take 1 tablet (25 mg total) by mouth daily.  . valsartan (DIOVAN) 320 MG tablet Take 1 tablet (320 mg total) by mouth daily.  Marland Kitchen warfarin (COUMADIN) 1 MG tablet Take 1 tablet (1 mg total) by mouth daily.  Marland Kitchen warfarin (COUMADIN) 2 MG tablet Take 1 tablet (2 mg total) by mouth daily.   No facility-administered encounter medications on file as of 07/19/2015.     Allergies: No Known Allergies  Family History: Family History  Problem Relation Age of Onset  . Heart attack Father   . Alcohol abuse Brother   . Breast cancer Paternal Grandmother   . Depression Mother   . Alcoholism Brother   . Depression Brother      Social History: Social History  Substance Use Topics  . Smoking status: Never Smoker   . Smokeless tobacco: Never Used  . Alcohol Use: No   Social History   Social History Narrative   Married. Currently lives with daughter in [place] from [place] secondary to declining health.    Wears seatbelt, wears bike helmet.    - does not exercise daily.    - takes vitamins.    - Smoke alarm in the home. Guns in the home, locked case.   - feels safe in her relationships.       Epworth Sleepiness Scale = 10 (04/04/2015)      Homemaker      Highest level of education:  High school       Review of Systems:  CONSTITUTIONAL: No fevers, chills, night sweats, or weight loss.  EYES: No visual changes or eye pain ENT: No hearing changes.  No history of nose bleeds.   RESPIRATORY: No cough, wheezing and shortness of breath.   CARDIOVASCULAR: Negative for chest pain, and palpitations.   GI: Negative for abdominal discomfort, blood in stools or black stools.  No recent change in bowel habits.   GU:  No history of incontinence.   MUSCLOSKELETAL: No history of joint pain +swelling.  No myalgias.   SKIN: Negative for lesions, rash, and itching.   HEMATOLOGY/ONCOLOGY: Negative for prolonged bleeding, bruising easily, and swollen nodes.  No history of cancer.   ENDOCRINE: Negative for cold or heat intolerance, polydipsia or goiter.   PSYCH:  +depression or anxiety symptoms.   NEURO: As Above.   Vital Signs:  BP 140/80 mmHg  Pulse 87  Wt 163 lb 2 oz (73.993 kg)  SpO2 97% Pain Scale: 0 on a scale of 0-10   General Medical Exam:   General:  Well appearing, comfortable.   Eyes/ENT: see cranial nerve examination.   Neck: No masses appreciated.  Full range of motion without tenderness.  No carotid bruits. Respiratory:  Clear to auscultation, good air entry bilaterally.   Cardiac:  Regular rate and rhythm, no murmur.   Extremities:  No deformities, 1+ pitting edema, or skin discoloration.   Skin:  No rashes or lesions.  Neurological Exam: MENTAL STATUS including orientation to time, place, person, recent and remote memory, attention span and concentration, language, and fund of knowledge is normal.  Speech is not dysarthric.  CRANIAL NERVES: II:  No visual field defects.  Unremarkable fundi.   III-IV-VI: Pupils equal round and reactive to light.  Normal conjugate, extra-ocular eye movements in all directions of gaze.  No nystagmus.  No ptosis. V:  Normal facial sensation.  Jaw jerk is present.   VII:  Normal facial symmetry and movements.  Snout and Myerson's sign is present  VIII:  Normal hearing and vestibular function.   IX-X:  Normal palatal movement.   XI:  Normal shoulder shrug and head rotation.   XII:  Normal tongue strength and range of motion, no deviation or fasciculation.  MOTOR:  No atrophy, fasciculations or abnormal movements.  No pronator drift.  Tone is normal.    Right Upper Extremity:    Left Upper Extremity:    Deltoid  5/5   Deltoid  5/5   Biceps  5/5   Biceps  5/5   Triceps  5/5   Triceps  5/5   Wrist extensors  5/5   Wrist extensors  5/5   Wrist flexors  5/5   Wrist flexors  5/5   Finger extensors  5/5   Finger extensors  5/5   Finger flexors  5/5   Finger flexors  5/5   Dorsal interossei  5/5   Dorsal interossei  5/5   Abductor pollicis  5/5   Abductor pollicis  5/5   Tone (Ashworth scale)  0+  Tone (Ashworth scale)  0+   Right Lower Extremity:    Left Lower Extremity:    Hip flexors  5/5   Hip flexors  5/5   Hip extensors  5/5   Hip extensors  5/5   Knee flexors  5/5   Knee flexors  5/5   Knee extensors  5/5   Knee extensors  5/5   Dorsiflexors  5/5   Dorsiflexors  5/5   Plantarflexors  5/5   Plantarflexors  5/5   Toe extensors  5/5  Toe extensors  5/5   Toe flexors  5/5   Toe flexors  5/5   Tone (Ashworth scale)  0+  Tone (Ashworth scale)  0+   MSRs:  Right                                                                  Left brachioradialis 3+  brachioradialis 3+  biceps 3+  biceps 3+  triceps 3+  triceps 3+  patellar 3+  patellar 3+  ankle jerk 2+  ankle jerk 2+  Hoffman no  Hoffman no  plantar response up  plantar response down   SENSORY:  Normal and symmetric perception of light touch, pinprick, vibration, and proprioception, except vibration reduced at left great toe.  Romberg's sign absent.   COORDINATION/GAIT: Normal finger-to- nose-finger and heel-to-shin.  Intact rapid alternating movements bilaterally.  Able to rise from a chair without using arms.  Gait narrow based, small steps with spastic quality, slightly unsteady especially with turns.   IMPRESSION: Mrs. Tassy is a 75 year-old female referred for evaluation of gait instability and falls.  Her exam is most concerning for cervical myelopathy as she has brisk reflexes throughout and extensor plantar response on the right.  With the number of falls she has had, she certainly may have injured her neck and needs MRI cervical spine.  Interestingly, she also has brisk facial reflexes, so if her cervical imaging is unrevealing, will need to further image her brain.  Although her leg strength has improved with physical therapy, she continues to have a spastic gait pattern causing her imbalance.  I stressed the importance of fall precautions as she high fall risk and on anticoagulation.  She is aware of the risks and benefits including potential for a devastating intracranial bleed and therefore urged her to use a walker.  Fall precautions discussed.  Literature was also provided for medical alert system.  Although she may have mild peripheral neuropathy of the feet, I do not feel this explains her current presentation.  Return to clinic in 6 weeks  The duration of this appointment visit was 60 minutes of face-to-face time with the patient.  Greater than 50% of this time was spent in counseling, explanation of diagnosis, planning of further management,  and coordination of care.   Thank you for allowing me to participate in patient's care.  If I can answer any additional questions, I would be pleased to do so.    Sincerely,    Ahnesti Townsend K. Posey Pronto, DO

## 2015-07-19 NOTE — Patient Instructions (Signed)
1.  MRI cervical spine without contrast 2.  You are a high fall risk, especially being on anticoagulation.  It is very important that you use all precautions to keep you safe.  Start using a rollator and continue physical therapy 3.  Consider having a medical alert system 4.  Return to clinic in 6 weeks   [place] that can locate patients outside the home:  http://mobilealertsystems.com/ 669-773-6039 http://www.lifelinesys.com/content/lifeline-products/get-life-gosafe  440-466-3752 www.verizonwireless.com/sure/ 503-397-7269  Medial Alert systems that link to smart phones:  http://www.lifelinesys.com/content/lifeline-products/response-app https://www.stanton.info/ RecycleRoad.pl.aspx  Alzheimer's Association GPS Tracker:  VoiceZap.is 503-483-7031   [facility]  Preventing Falls in the Home   Falls are common, often dreaded events in the lives of older people. Aside from the obvious injuries and even death that may result, falls can cause wide-ranging consequences including loss of independence, mental decline, decreased activity, and mobility. Younger people are also at risk of falling, especially those with chronic illnesses and fatigue.  Ways to reduce the risk for falling:  * Examine diet and medications. Warm foods and alcohol dilate blood vessels, which can lead to dizziness when standing. Sleep aids, antidepressants, and pain medications can also increase the likelihood of a fall.  * Get a vison exam. Poor vision, cataracts, and glaucoma increase the chances of falling.  * Check foot gear. Shoes should fit snugly and have a sturdy, nonskid sole and broad, low heel.  * Participate in a physician-approved exercise program to build and maintain muscle strength and improve balance and coordination.  * Increase vitamin D intake. Vitamin D improves muscle strength and  increases the amount of calcium the body is able to absorb and deposit in bones.  How to prevent falls from common hazards:  * Floors - Remove all loose wires, cords, and throw rugs. Minimize clutter. Make sure rugs are anchored and smooth. Keep furniture in its usual place.  * Chairs - Use chairs with straight backs, armrests, and firm seats. Add firm cushions to existing pieces to add height.  * Bathroom - Install grab bars and non-skid tape in the tub or shower. Use a bathtub transfer bench or a shower chair with a back support. Use an elevated toilet seat and/or safety rails to assist standing from a low surface. Do not use towel racks or bathroom tissue holders to help you stand.  * Lighting - Make sure halls, stairways, and entrances are well-lit. Install a night light in your bathroom or hallway. Make sure there is a light switch at the top and bottom of the staircase. Turn lights on if you get up in the middle of the night. Make sure lamps or light switches are within reach of the bed if you have to get up during the night.  * Kitchen - Install non-skid rubber mats near the sink and stove. Clean spills immediately. Store frequently used utensils, pots, and pans between waist and eye level. This helps prevent reaching and bending. Sit when getting things out of the lower cupboards.  * Living room / [place] furniture with wide spaces in between, giving enough room to move around. Establish a route through the living room that gives you something to hold onto as you walk.  * Stairs - Make sure treads, rails, and rugs are secure. Install a rail on both sides of the stairs. If stairs are a threat, it might be helpful to arrange most of your activities on the lower level to reduce the number of  times you must climb the stairs.  * Entrances and doorways - Install metal handles on the walls adjacent to the doorknobs of all doors to make it more secure as you travel through the doorway.  Tips for  maintaining balance:  * Keep at least one hand free at all times Try using a backpack or fanny pack to hold things rather than carrying them in your hands. Never carry objects in both hands when walking as this interferes with keeping your balance.  * Attempt to swing both arms from front to back while walking. This might require a conscious effort if Parkinson's disease has diminished your movement. It will, however, help you to maintain balance and posture, and reduce fatigue.  * Consciously lift your feet off the ground when walking. Shuffling and dragging of the feet is a common culprit in losing your balance.  * When trying to navigate turns, use a "U" technique of facing forward and making a wide turn, rather than pivoting sharply.  * Try to stand with your feet shoulder-length apart. When your feet are close together for any length of time, you increase your risk of losing your balance and falling.  * Do one thing at a time. Do not try to walk and accomplish another task, such as reading or looking around. The decrease in your automatic reflexes complicates motor function, so the less distraction, the better.  * Do not wear rubber or gripping soled shoes, they might "catch" on the floor and cause tripping.  * Move slowly when changing positions. Use deliberate, concentrated movements and, if needed, use a grab bar or walking aid. Count fifteen (15) seconds after standing to begin walking.  * If balance is a continuous problem, you might want to consider a walking aid such as a cane, walking stick, or walker. Once you have mastered walking with help, you may be ready to try it again on your own.  This information is provided by [facility] and is not intended to replace the medical advice of your physician or other health care providers. Please consult your physician or other health care providers for advice regarding your specific medical condition.

## 2015-07-19 NOTE — Addendum Note (Signed)
Addended by: Chester Holstein on: 07/19/2015 12:23 PM   Modules accepted: Orders

## 2015-07-20 ENCOUNTER — Telehealth: Payer: Self-pay | Admitting: Pharmacist Clinician (PhC)/ Clinical Pharmacy Specialist

## 2015-07-20 ENCOUNTER — Encounter: Payer: Medicare Other | Admitting: Pharmacist Clinician (PhC)/ Clinical Pharmacy Specialist

## 2015-07-20 DIAGNOSIS — S42391S Other fracture of shaft of right humerus, sequela: Secondary | ICD-10-CM | POA: Diagnosis not present

## 2015-07-20 MED ORDER — WARFARIN SODIUM 2 MG PO TABS: ORAL_TABLET | ORAL | Status: DC

## 2015-07-20 NOTE — Telephone Encounter (Signed)
New message      Pt was seen on the 15th.  She said [facility] in [place] does not have her presc for warfarin with the new dosage.  Please call it in to the pharmacy when you get a moment.  She want 90 pills please.  If there is a problem, please call.

## 2015-08-10 ENCOUNTER — Other Ambulatory Visit: Payer: Self-pay

## 2015-08-10 DIAGNOSIS — Z1231 Encounter for screening mammogram for malignant neoplasm of breast: Secondary | ICD-10-CM

## 2015-08-17 ENCOUNTER — Other Ambulatory Visit: Payer: Self-pay | Admitting: *Deleted

## 2015-08-17 DIAGNOSIS — I10 Essential (primary) hypertension: Secondary | ICD-10-CM

## 2015-08-17 MED ORDER — SPIRONOLACTONE 25 MG PO TABS: 25.0000 mg | ORAL_TABLET | Freq: Every day | ORAL | Status: DC

## 2015-08-17 NOTE — Telephone Encounter (Signed)
Pt called requesting a refill.   RF request for spironolactone LOV: 07/13/15 Next ov: None Last written: 04/14/15 #90 w/ 1RF  Rs sent for #90 w/ 1RF. Pt advised and voiced understanding.

## 2015-08-28 ENCOUNTER — Ambulatory Visit
Admission: RE | Admit: 2015-08-28 | Discharge: 2015-08-28 | Disposition: A | Discharge status: Home / Self Care | Payer: Medicare Other | Source: Ambulatory Visit

## 2015-08-28 ENCOUNTER — Telehealth: Payer: Self-pay | Admitting: Cardiovascular Disease

## 2015-08-28 ENCOUNTER — Ambulatory Visit (INDEPENDENT_AMBULATORY_CARE_PROVIDER_SITE_OTHER): Payer: Medicare Other | Admitting: Pharmacist Clinician (PhC)/ Clinical Pharmacy Specialist

## 2015-08-28 DIAGNOSIS — Z7901 Long term (current) use of anticoagulants: Secondary | ICD-10-CM | POA: Diagnosis not present

## 2015-08-28 DIAGNOSIS — I480 Paroxysmal atrial fibrillation: Secondary | ICD-10-CM

## 2015-08-28 DIAGNOSIS — S42391S Other fracture of shaft of right humerus, sequela: Secondary | ICD-10-CM | POA: Diagnosis not present

## 2015-08-28 DIAGNOSIS — Z1231 Encounter for screening mammogram for malignant neoplasm of breast: Secondary | ICD-10-CM

## 2015-08-28 LAB — POCT INR: INR: 1.9

## 2015-08-28 NOTE — Telephone Encounter (Signed)
Opened in error

## 2015-08-29 ENCOUNTER — Other Ambulatory Visit: Payer: Self-pay | Admitting: Family Medicine

## 2015-08-29 DIAGNOSIS — Z853 Personal history of malignant neoplasm of breast: Secondary | ICD-10-CM

## 2015-09-12 ENCOUNTER — Encounter: Payer: Medicare Other | Admitting: Pharmacist Clinician (PhC)/ Clinical Pharmacy Specialist

## 2015-09-12 ENCOUNTER — Ambulatory Visit (INDEPENDENT_AMBULATORY_CARE_PROVIDER_SITE_OTHER): Payer: Medicare Other | Admitting: Pharmacist Clinician (PhC)/ Clinical Pharmacy Specialist

## 2015-09-12 DIAGNOSIS — Z7901 Long term (current) use of anticoagulants: Secondary | ICD-10-CM | POA: Diagnosis not present

## 2015-09-12 DIAGNOSIS — I480 Paroxysmal atrial fibrillation: Secondary | ICD-10-CM | POA: Diagnosis not present

## 2015-09-12 LAB — POCT INR: INR: 2.1

## 2015-09-18 ENCOUNTER — Other Ambulatory Visit: Payer: Self-pay

## 2015-09-18 MED ORDER — VALSARTAN 320 MG PO TABS: 320.0000 mg | ORAL_TABLET | Freq: Every day | ORAL | Status: DC

## 2015-09-19 ENCOUNTER — Ambulatory Visit
Admission: RE | Admit: 2015-09-19 | Discharge: 2015-09-19 | Disposition: A | Discharge status: Home / Self Care | Payer: Medicare Other | Source: Ambulatory Visit | Attending: Family Medicine | Admitting: Family Medicine

## 2015-09-19 ENCOUNTER — Other Ambulatory Visit: Payer: Self-pay | Admitting: Family Medicine

## 2015-09-19 DIAGNOSIS — N6002 Solitary cyst of left breast: Secondary | ICD-10-CM | POA: Diagnosis not present

## 2015-09-19 DIAGNOSIS — Z853 Personal history of malignant neoplasm of breast: Secondary | ICD-10-CM

## 2015-09-19 DIAGNOSIS — Z1239 Encounter for other screening for malignant neoplasm of breast: Secondary | ICD-10-CM | POA: Insufficient documentation

## 2015-10-04 ENCOUNTER — Ambulatory Visit (INDEPENDENT_AMBULATORY_CARE_PROVIDER_SITE_OTHER): Payer: Medicare Other | Admitting: Pharmacist

## 2015-10-04 DIAGNOSIS — I480 Paroxysmal atrial fibrillation: Secondary | ICD-10-CM | POA: Diagnosis not present

## 2015-10-04 DIAGNOSIS — Z7901 Long term (current) use of anticoagulants: Secondary | ICD-10-CM

## 2015-10-04 LAB — POCT INR: INR: 4.5

## 2015-10-18 ENCOUNTER — Ambulatory Visit (INDEPENDENT_AMBULATORY_CARE_PROVIDER_SITE_OTHER): Payer: Medicare Other | Admitting: Pharmacist Clinician (PhC)/ Clinical Pharmacy Specialist

## 2015-10-18 DIAGNOSIS — I480 Paroxysmal atrial fibrillation: Secondary | ICD-10-CM

## 2015-10-18 DIAGNOSIS — Z7901 Long term (current) use of anticoagulants: Secondary | ICD-10-CM | POA: Diagnosis not present

## 2015-10-18 LAB — POCT INR: INR: 3.9

## 2015-10-21 ENCOUNTER — Other Ambulatory Visit: Payer: Self-pay | Admitting: Family Medicine

## 2015-10-26 ENCOUNTER — Other Ambulatory Visit: Payer: Self-pay | Admitting: Family Medicine

## 2015-11-01 ENCOUNTER — Ambulatory Visit (INDEPENDENT_AMBULATORY_CARE_PROVIDER_SITE_OTHER): Payer: Medicare Other | Admitting: Pharmacist Clinician (PhC)/ Clinical Pharmacy Specialist

## 2015-11-01 DIAGNOSIS — I480 Paroxysmal atrial fibrillation: Secondary | ICD-10-CM | POA: Diagnosis not present

## 2015-11-01 DIAGNOSIS — Z7901 Long term (current) use of anticoagulants: Secondary | ICD-10-CM

## 2015-11-01 LAB — POCT INR: INR: 2.4

## 2015-11-11 ENCOUNTER — Other Ambulatory Visit: Payer: Self-pay | Admitting: Internal Medicine

## 2015-11-22 ENCOUNTER — Ambulatory Visit (INDEPENDENT_AMBULATORY_CARE_PROVIDER_SITE_OTHER): Payer: Medicare Other | Admitting: Pharmacist Clinician (PhC)/ Clinical Pharmacy Specialist

## 2015-11-22 DIAGNOSIS — Z7901 Long term (current) use of anticoagulants: Secondary | ICD-10-CM | POA: Diagnosis not present

## 2015-11-22 DIAGNOSIS — I480 Paroxysmal atrial fibrillation: Secondary | ICD-10-CM | POA: Diagnosis not present

## 2015-11-22 LAB — POCT INR: INR: 3.6

## 2015-11-24 ENCOUNTER — Encounter: Payer: Self-pay | Admitting: Family Medicine

## 2015-11-24 ENCOUNTER — Ambulatory Visit (INDEPENDENT_AMBULATORY_CARE_PROVIDER_SITE_OTHER): Payer: Medicare Other | Admitting: Family Medicine

## 2015-11-24 ENCOUNTER — Other Ambulatory Visit: Payer: Self-pay | Admitting: Family Medicine

## 2015-11-24 VITALS — BP 134/73 | HR 78 | Temp 98.3°F | Resp 20 | Wt 165.0 lb

## 2015-11-24 DIAGNOSIS — R631 Polydipsia: Secondary | ICD-10-CM

## 2015-11-24 DIAGNOSIS — Z7901 Long term (current) use of anticoagulants: Secondary | ICD-10-CM

## 2015-11-24 DIAGNOSIS — R5383 Other fatigue: Secondary | ICD-10-CM | POA: Diagnosis not present

## 2015-11-24 DIAGNOSIS — R7309 Other abnormal glucose: Secondary | ICD-10-CM | POA: Insufficient documentation

## 2015-11-24 DIAGNOSIS — E559 Vitamin D deficiency, unspecified: Secondary | ICD-10-CM

## 2015-11-24 DIAGNOSIS — S99929A Unspecified injury of unspecified foot, initial encounter: Secondary | ICD-10-CM

## 2015-11-24 DIAGNOSIS — I480 Paroxysmal atrial fibrillation: Secondary | ICD-10-CM | POA: Diagnosis not present

## 2015-11-24 LAB — CBC WITH DIFFERENTIAL/PLATELET
BASOS ABS: 0 {cells}/uL (ref 0–200)
BASOS PCT: 0 %
EOS ABS: 116 {cells}/uL (ref 15–500)
Eosinophils Relative: 2 %
HCT: 41.5 % (ref 35.0–45.0)
Hemoglobin: 13.4 g/dL (ref 11.7–15.5)
LYMPHS PCT: 17 %
Lymphs Abs: 986 cells/uL (ref 850–3900)
MCH: 28.8 pg (ref 27.0–33.0)
MCHC: 32.3 g/dL (ref 32.0–36.0)
MCV: 89.1 fL (ref 80.0–100.0)
MONOS PCT: 6 %
MPV: 11.8 fL (ref 7.5–12.5)
Monocytes Absolute: 348 cells/uL (ref 200–950)
NEUTROS PCT: 75 %
Neutro Abs: 4350 cells/uL (ref 1500–7800)
PLATELETS: 202 10*3/uL (ref 140–400)
RBC: 4.66 MIL/uL (ref 3.80–5.10)
RDW: 14.7 % (ref 11.0–15.0)
WBC: 5.8 10*3/uL (ref 3.8–10.8)

## 2015-11-24 LAB — BASIC METABOLIC PANEL
BUN: 25 mg/dL (ref 7–25)
CHLORIDE: 103 mmol/L (ref 98–110)
CO2: 26 mmol/L (ref 20–31)
Calcium: 9.1 mg/dL (ref 8.6–10.4)
Creat: 1.31 mg/dL — ABNORMAL HIGH (ref 0.60–0.93)
GLUCOSE: 116 mg/dL — AB (ref 65–99)
POTASSIUM: 4.1 mmol/L (ref 3.5–5.3)
SODIUM: 139 mmol/L (ref 135–146)

## 2015-11-24 LAB — TSH: TSH: 2.92 mIU/L

## 2015-11-24 LAB — MAGNESIUM: Magnesium: 1.9 mg/dL (ref 1.5–2.5)

## 2015-11-24 LAB — HEMOGLOBIN A1C
Hgb A1c MFr Bld: 6.3 % — ABNORMAL HIGH (ref <5.7)
Mean Plasma Glucose: 134 mg/dL

## 2015-11-24 NOTE — Patient Instructions (Signed)
I have collected labs today. We will call you when all are resulted.  I have also sent a referral to podiatrist to address your toenails.

## 2015-11-24 NOTE — Progress Notes (Signed)
Patient ID: Kathy Hobbs, female   DOB: 28-May-1941, 75 y.o.   MRN: [id number]    Kathy Hobbs , 1940/11/11, 75 y.o., female MRN: [id number]  CC: fatigue and bilateral toe nail discoloration  Subjective: Pt presents for an acute OV with complaints of lack of energy of > 2 weeks and feels it is getting worse. She states she has noticed she is taking more naps, has been  more warm and thirsty. She is eating well and her weight is stable. She denies any bleeding, her is INR elevated. Her  husband states he notices she has no energy. She is not able to go to the store because she gets tired and wants to go home. He says this is a change for her, she usually wants to go shopping.  He states last time this occurred her kidneys and potassium were abnormal. She denies any changes in urination with dysuria or frequency. She states she gets tired doing just about everything. She is requiring naps in the day. She denies chest pain, shortness of breath or edema. She has intermittent dizziness, that is unchanged from prior vertigo. She noticed discoloration in both her nailbeds about 6 weeks ago. She states they are both black. She does not recall injury. She did get a new pair of tennis shoes at that time that were uncomfortable and she stopped wearing them. She denies redness, drainage or pain associated with nail changes.    No Known Allergies Social History  Substance Use Topics  . Smoking status: Never Smoker   . Smokeless tobacco: Never Used  . Alcohol Use: No   Past Medical History  Diagnosis Date  . Depression   . Hypertension   . Hyperlipidemia   . Urine incontinence   . Atrial fibrillation ([place])   . Breast cancer ([place])   . Vertigo   . Right humeral fracture 2016   Past Surgical History  Procedure Laterality Date  . Breast biopsy    . Breast lumpectomy    . Renal artery stent Bilateral   . Femoral artery stent      ? pt states "Stent in my leg"   Family History  Problem  Relation Age of Onset  . Heart attack Father   . Alcohol abuse Brother   . Breast cancer Paternal Grandmother   . Depression Mother   . Alcoholism Brother   . Depression Brother      Medication List       This list is accurate as of: 11/24/15  1:40 PM.  Always use your most recent med list.               atorvastatin 80 MG tablet  Commonly known as:  LIPITOR  Take 1 tablet (80 mg total) by mouth daily.     cholecalciferol 1000 units tablet  Commonly known as:  VITAMIN D  Take 1,000 Units by mouth daily.     citalopram 20 MG tablet  Commonly known as:  CELEXA  Take 1 tablet (20 mg total) by mouth daily.     omeprazole 20 MG capsule  Commonly known as:  PRILOSEC  take 1 capsule by mouth daily     sertraline 50 MG tablet  Commonly known as:  ZOLOFT  50 mg QD.     spironolactone 25 MG tablet  Commonly known as:  ALDACTONE  Take 1 tablet (25 mg total) by mouth daily.     valsartan 320 MG tablet  Commonly known as:  DIOVAN  Take 1 tablet (320 mg total) by mouth daily.     warfarin 2 MG tablet  Commonly known as:  COUMADIN  take 1 tablet by mouth daily or as directed        ROS: Negative, with the exception of above mentioned in HPI  Objective:  BP 134/73 mmHg  Pulse 78  Temp(Src) 98.3 F (36.8 C)  Resp 20  Wt 165 lb (74.844 kg)  SpO2 99% Body mass index is 26.64 kg/(m^2). Gen: Afebrile. No acute distress. Nontoxic in appearance, well developed, well nourished. Pleasant caucasian female.  HENT: AT. [place].  MMM, no oral lesions. Eyes:Pupils Equal Round Reactive to light, Extraocular movements intact,  Conjunctiva without redness, discharge or icterus. Neck/lymp/endocrine: Supple,no lymphadenopathy, no thyromegaly CV: RRR, no edema, +2/4 P posterior tibialis pulses Chest: CTAB, no wheeze or crackles. Good air movement, normal resp effort.  Abd: Soft. NTND. BS present EXT; bilateral large toe/1st metatarsal with darkened nailbeds. Warm, well perfused, no  erythema or swelling. Good cap refill. No tenderness.  Skin: No rashes, purpura or petechiae.  Neuro: Normal gait. PERLA. EOMi. Alert. Oriented x3  Psych: Normal affect, dress and demeanor. Normal speech. Normal thought content and judgment.  Assessment/Plan: Kathy Hobbs is a 75 y.o. female present for acute OV for  Other fatigue/Vitamin D deficiency/Paroxysmal atrial fibrillation (HCC)/Long term (current) use of anticoagulants [Z79.01]/ Polydipsia/Elevated glucose - Pt seen for similar complaints in the past. She follows with cardiology routinely. Is not fluid overloaded on exam. HR WNL and regular.  - will r/o vitamin, electrolyte , thyroid or anemia as cause, pt denies bleeding, INR elevated by last collection.  - CBC w/Diff - VITAMIN D 25 Hydroxy (Vit-D Deficiency, Fractures) - TSH - Basic Metabolic Panel (BMET) - HgB A1c - Magnesium  Toe injury: - bilateral 1st toe nail bed injury. Toe nails are black from cuticle to end of nail, despite her say it has only been present 6 weeks. Possibly secondary to new shoes. Nails long, on coumadin. Good pulses/cap refill. - podiatry referral  - F/U PRN   > 25 minutes spent with patient, >50% of time spent face to face counseling patient and coordinating care.  electronically signed by:  Howard Pouch, DO  [place]

## 2015-11-25 LAB — VITAMIN D 25 HYDROXY (VIT D DEFICIENCY, FRACTURES): Vit D, 25-Hydroxy: 46 ng/mL (ref 30–100)

## 2015-11-27 ENCOUNTER — Telehealth: Payer: Self-pay | Admitting: Family Medicine

## 2015-11-27 DIAGNOSIS — N183 Chronic kidney disease, stage 3 unspecified: Secondary | ICD-10-CM | POA: Insufficient documentation

## 2015-11-27 DIAGNOSIS — R7309 Other abnormal glucose: Secondary | ICD-10-CM | POA: Insufficient documentation

## 2015-11-27 NOTE — Telephone Encounter (Signed)
Please call pt: - her kidney function has mildly decreased and sugar is higher than desired. Otherwise, all of her labs are within normal range and stable.  - I would encouraged to drink plenty of water daily, and avoid nsaids( Aleve, advil, naproxen etc).  - I will want to retest her kidney function and fasting glucose, this week with an appt to discuss results to occur the day after collection.  - lab orders placed.

## 2015-11-28 NOTE — Telephone Encounter (Signed)
Patient aware of results.  Lab appt scheduled 11/30/15 and appt to discuss results 12/01/15.

## 2015-11-28 NOTE — Telephone Encounter (Signed)
Left message for patient to return call.

## 2015-11-30 ENCOUNTER — Other Ambulatory Visit (INDEPENDENT_AMBULATORY_CARE_PROVIDER_SITE_OTHER): Payer: Medicare Other

## 2015-11-30 DIAGNOSIS — R7309 Other abnormal glucose: Secondary | ICD-10-CM

## 2015-11-30 DIAGNOSIS — N183 Chronic kidney disease, stage 3 unspecified: Secondary | ICD-10-CM

## 2015-11-30 LAB — BASIC METABOLIC PANEL WITH GFR
BUN: 23 mg/dL (ref 7–25)
CALCIUM: 9.3 mg/dL (ref 8.6–10.4)
CO2: 24 mmol/L (ref 20–31)
Chloride: 104 mmol/L (ref 98–110)
Creat: 1.08 mg/dL — ABNORMAL HIGH (ref 0.60–0.93)
GFR, EST AFRICAN AMERICAN: 58 mL/min — AB (ref >=60)
GFR, EST NON AFRICAN AMERICAN: 51 mL/min — AB (ref >=60)
Glucose, Bld: 83 mg/dL (ref 65–99)
Potassium: 4 mmol/L (ref 3.5–5.3)
SODIUM: 140 mmol/L (ref 135–146)

## 2015-11-30 LAB — GLUCOSE, RANDOM: GLUCOSE: 90 mg/dL (ref 70–99)

## 2015-12-01 ENCOUNTER — Ambulatory Visit (INDEPENDENT_AMBULATORY_CARE_PROVIDER_SITE_OTHER): Payer: Medicare Other | Admitting: Family Medicine

## 2015-12-01 ENCOUNTER — Encounter: Payer: Self-pay | Admitting: Family Medicine

## 2015-12-01 VITALS — BP 123/75 | HR 73 | Temp 98.3°F | Resp 20 | Ht 66.0 in | Wt 167.8 lb

## 2015-12-01 DIAGNOSIS — R7989 Other specified abnormal findings of blood chemistry: Secondary | ICD-10-CM

## 2015-12-01 DIAGNOSIS — R748 Abnormal levels of other serum enzymes: Secondary | ICD-10-CM | POA: Diagnosis not present

## 2015-12-01 DIAGNOSIS — R7309 Other abnormal glucose: Secondary | ICD-10-CM

## 2015-12-01 DIAGNOSIS — R5383 Other fatigue: Secondary | ICD-10-CM

## 2015-12-01 NOTE — Progress Notes (Signed)
Patient ID: Kathy Hobbs, female   DOB: 08/28/1940, 75 y.o.   MRN: [id number]    Kathy Hobbs , 1940/09/28, 75 y.o., female MRN: [id number]  CC: fatigue and bilateral toe nail discoloration  Subjective:   Fatigue: this is not a complaint today. She states she is feeling good. Pt is alone at appt today. She just bought a new care yesterday. She still endorses increased thirst, but not as bad. Her a1c was 6.3,which is increased from prior. She does endorse eating more sugar/ice cream. She is also on antidepressants.   Bilateral ear discomfort: patient states she has noticed her ears hurt sometimes, especially in the morning.  She cleans them and feels she gets a good bit of wax out of it. She does not feel her hearing has changed.   Prior visit:  Pt presents for an acute OV with complaints of lack of energy of > 2 weeks and feels it is getting worse. She states she has noticed she is taking more naps, has been  more warm and thirsty. She is eating well and her weight is stable. She denies any bleeding, her is INR elevated. Her  husband states he notices she has no energy. She is not able to go to the store because she gets tired and wants to go home. He says this is a change for her, she usually wants to go shopping.  He states last time this occurred her kidneys and potassium were abnormal. She denies any changes in urination with dysuria or frequency. She states she gets tired doing just about everything. She is requiring naps in the day. She denies chest pain, shortness of breath or edema. She has intermittent dizziness, that is unchanged from prior vertigo. She noticed discoloration in both her nailbeds about 6 weeks ago. She states they are both black. She does not recall injury. She did get a new pair of tennis shoes at that time that were uncomfortable and she stopped wearing them. She denies redness, drainage or pain associated with nail changes.   Recent Results (from the past 2160  hour(s))  POCT INR     Status: None   Collection Time: 09/12/15 11:51 AM  Result Value Ref Range   INR 2.1   POCT INR     Status: None   Collection Time: 10/04/15 11:31 AM  Result Value Ref Range   INR 4.5   POCT INR     Status: None   Collection Time: 10/18/15 10:20 AM  Result Value Ref Range   INR 3.9   POCT INR     Status: None   Collection Time: 11/01/15 10:02 AM  Result Value Ref Range   INR 2.4   POCT INR     Status: None   Collection Time: 11/22/15 10:59 AM  Result Value Ref Range   INR 3.6   CBC w/Diff     Status: None   Collection Time: 11/24/15  2:10 PM  Result Value Ref Range   WBC 5.8 3.8 - 10.8 K/uL   RBC 4.66 3.80 - 5.10 MIL/uL   Hemoglobin 13.4 11.7 - 15.5 g/dL   HCT 41.5 35.0 - 45.0 %   MCV 89.1 80.0 - 100.0 fL   MCH 28.8 27.0 - 33.0 pg   MCHC 32.3 32.0 - 36.0 g/dL   RDW 14.7 11.0 - 15.0 %   Platelets 202 140 - 400 K/uL   MPV 11.8 7.5 - 12.5 fL   Neutro Abs 4350 1500 - 7800 cells/uL  Lymphs Abs 986 850 - 3900 cells/uL   Monocytes Absolute 348 200 - 950 cells/uL   Eosinophils Absolute 116 15 - 500 cells/uL   Basophils Absolute 0 0 - 200 cells/uL   Neutrophils Relative % 75 %   Lymphocytes Relative 17 %   Monocytes Relative 6 %   Eosinophils Relative 2 %   Basophils Relative 0 %   Smear Review Criteria for review not met     Comment: ** Please note change in unit of measure and reference range(s). **  VITAMIN D 25 Hydroxy (Vit-D Deficiency, Fractures)     Status: None   Collection Time: 11/24/15  2:10 PM  Result Value Ref Range   Vit D, 25-Hydroxy 46 30 - 100 ng/mL    Comment: Vitamin D Status           25-OH Vitamin D        Deficiency                <20 ng/mL        Insufficiency         20 - 29 ng/mL        Optimal             > or = 30 ng/mL   For 25-OH Vitamin D testing on patients on D2-supplementation and patients for whom quantitation of D2 and D3 fractions is required, the QuestAssureD 25-OH VIT D, (D2,D3), LC/MS/MS is recommended:  order code [id number] (patients > 2 yrs).   TSH     Status: None   Collection Time: 11/24/15  2:10 PM  Result Value Ref Range   TSH 2.92 mIU/L    Comment:   Reference Range   > or = 20 Years  0.40-4.50   Pregnancy Range First trimester  0.26-2.66 Second trimester 0.55-2.73 Third trimester  0.43-2.91     Basic Metabolic Panel (BMET)     Status: Abnormal   Collection Time: 11/24/15  2:10 PM  Result Value Ref Range   Sodium 139 135 - 146 mmol/L   Potassium 4.1 3.5 - 5.3 mmol/L   Chloride 103 98 - 110 mmol/L   CO2 26 20 - 31 mmol/L   Glucose, Bld 116 (H) 65 - 99 mg/dL   BUN 25 7 - 25 mg/dL   Creat 1.31 (H) 0.60 - 0.93 mg/dL    Comment:   For patients > or = 74 years of age: The upper reference limit for Creatinine is approximately 13% higher for people identified as African-American.      Calcium 9.1 8.6 - 10.4 mg/dL  HgB A1c     Status: Abnormal   Collection Time: 11/24/15  2:10 PM  Result Value Ref Range   Hgb A1c MFr Bld 6.3 (H) <5.7 %    Comment:   For someone without known diabetes, a hemoglobin A1c value between 5.7% and 6.4% is consistent with prediabetes and should be confirmed with a follow-up test.   For someone with known diabetes, a value <7% indicates that their diabetes is well controlled. A1c targets should be individualized based on duration of diabetes, age, co-morbid conditions and other considerations.   This assay result is consistent with an increased risk of diabetes.   Currently, no consensus exists regarding use of hemoglobin A1c for diagnosis of diabetes in children.      Mean Plasma Glucose 134 mg/dL  Magnesium     Status: None   Collection Time: 11/24/15  2:10 PM  Result Value Ref  Range   Magnesium 1.9 1.5 - 2.5 mg/dL  BASIC METABOLIC PANEL WITH GFR     Status: Abnormal   Collection Time: 11/30/15  7:56 AM  Result Value Ref Range   Sodium 140 135 - 146 mmol/L   Potassium 4.0 3.5 - 5.3 mmol/L   Chloride 104 98 - 110 mmol/L   CO2 24 20  - 31 mmol/L   Glucose, Bld 83 65 - 99 mg/dL   BUN 23 7 - 25 mg/dL   Creat 1.08 (H) 0.60 - 0.93 mg/dL    Comment:   For patients > or = 74 years of age: The upper reference limit for Creatinine is approximately 13% higher for people identified as African-American.      Calcium 9.3 8.6 - 10.4 mg/dL   GFR, Est African American 58 (L) >=60 mL/min   GFR, Est Non African American 51 (L) >=60 mL/min  Glucose     Status: None   Collection Time: 11/30/15  7:56 AM  Result Value Ref Range   Glucose, Bld 90 70 - 99 mg/dL     No Known Allergies Social History  Substance Use Topics  . Smoking status: Never Smoker   . Smokeless tobacco: Never Used  . Alcohol Use: No   Past Medical History  Diagnosis Date  . Depression   . Hypertension   . Hyperlipidemia   . Urine incontinence   . Atrial fibrillation ([place])   . Breast cancer ([place])   . Vertigo   . Right humeral fracture 2016   Past Surgical History  Procedure Laterality Date  . Breast biopsy    . Breast lumpectomy    . Renal artery stent Bilateral   . Femoral artery stent      ? pt states "Stent in my leg"   Family History  Problem Relation Age of Onset  . Heart attack Father   . Alcohol abuse Brother   . Breast cancer Paternal Grandmother   . Depression Mother   . Alcoholism Brother   . Depression Brother      Medication List       This list is accurate as of: 12/01/15 10:32 AM.  Always use your most recent med list.               atorvastatin 80 MG tablet  Commonly known as:  LIPITOR  Take 1 tablet (80 mg total) by mouth daily.     cholecalciferol 1000 units tablet  Commonly known as:  VITAMIN D  Take 1,000 Units by mouth daily.     citalopram 20 MG tablet  Commonly known as:  CELEXA  Take 1 tablet (20 mg total) by mouth daily.     omeprazole 20 MG capsule  Commonly known as:  PRILOSEC  take 1 capsule by mouth daily     sertraline 50 MG tablet  Commonly known as:  ZOLOFT  50 mg QD.      spironolactone 25 MG tablet  Commonly known as:  ALDACTONE  Take 1 tablet (25 mg total) by mouth daily.     valsartan 320 MG tablet  Commonly known as:  DIOVAN  Take 1 tablet (320 mg total) by mouth daily.     warfarin 2 MG tablet  Commonly known as:  COUMADIN  take 1 tablet by mouth daily or as directed        ROS: Negative, with the exception of above mentioned in HPI  Objective:  BP 123/75 mmHg  Pulse 73  Temp(Src) 98.3 F (36.8 C) (Oral)  Resp 20  Ht '5\' 6"'  (1.676 m)  Wt 167 lb 12 oz (76.091 kg)  BMI 27.09 kg/m2  SpO2 96% Body mass index is 27.09 kg/(m^2). Gen: Afebrile. No acute distress. Nontoxic in appearance, well developed, well nourished. Pleasant caucasian female.  HENT: AT. [place].  MMM, no oral lesions. Bilateral TM without erythema, bulging. EAM WNL.  Eyes:Pupils Equal Round Reactive to light, Extraocular movements intact,  Conjunctiva without redness, discharge or icterus. CV: RRR, no edema Chest: CTAB, no wheeze or crackles. Good air movement, normal resp effort.  Neuro: Normal gait. PERLA. EOMi. Alert. Oriented x3  Psych: Normal affect, dress and demeanor. Normal speech. Normal thought content and judgment.  Assessment/Plan: Nathaly Dawkins is a 75 y.o. female present for acute OV for  Other fatigue/Elevated glucose/elevated creatine - Pt seen for similar complaints in the past. She follows with cardiology routinely. Is not fluid overloaded on exam. HR WNL and regular.  - reviewed all lab results in detail. Pts elevated creatine has decreased to her baseline and her fasting glucose is WNL. Discussed elevated a1c and prediabetes with patient today. Patient to monitor diet more closely. Cut back on sugar/carbs. F/u 6 months for retesting.   Bilateral Ear discomfort: Bilateral ears are normal. Discomfort likely mild allergies. Discussed possible OTC treatments if it bothers her .   electronically signed by:  Howard Pouch, DO  [place]

## 2015-12-01 NOTE — Patient Instructions (Signed)
Your kidney function and sugar returned to your baseline. Watch the sugar and carbohydrate intake, and we will re-check you r a1c in 6 months.

## 2015-12-06 ENCOUNTER — Ambulatory Visit: Payer: Self-pay | Admitting: Podiatry

## 2015-12-07 ENCOUNTER — Ambulatory Visit (INDEPENDENT_AMBULATORY_CARE_PROVIDER_SITE_OTHER): Payer: Medicare Other | Admitting: Pharmacist Clinician (PhC)/ Clinical Pharmacy Specialist

## 2015-12-07 DIAGNOSIS — Z7901 Long term (current) use of anticoagulants: Secondary | ICD-10-CM

## 2015-12-07 DIAGNOSIS — I480 Paroxysmal atrial fibrillation: Secondary | ICD-10-CM | POA: Diagnosis not present

## 2015-12-07 LAB — POCT INR: INR: 3.1

## 2015-12-22 ENCOUNTER — Other Ambulatory Visit: Payer: Self-pay | Admitting: *Deleted

## 2015-12-22 MED ORDER — OMEPRAZOLE 20 MG PO CPDR: 20.0000 mg | DELAYED_RELEASE_CAPSULE | Freq: Every day | ORAL | Status: DC

## 2015-12-22 NOTE — Telephone Encounter (Signed)
Pt LMOM on 12/22/15 at 11:26am requesting a 90 day supply for her omeprazole be sent to pharmacy. Rx sent. Pt advised and voiced understanding.

## 2015-12-28 ENCOUNTER — Ambulatory Visit (INDEPENDENT_AMBULATORY_CARE_PROVIDER_SITE_OTHER): Payer: Medicare Other | Admitting: Pharmacist

## 2015-12-28 DIAGNOSIS — I480 Paroxysmal atrial fibrillation: Secondary | ICD-10-CM

## 2015-12-28 DIAGNOSIS — Z7901 Long term (current) use of anticoagulants: Secondary | ICD-10-CM

## 2015-12-28 LAB — POCT INR: INR: 2.7

## 2016-01-13 ENCOUNTER — Other Ambulatory Visit: Payer: Self-pay | Admitting: Family Medicine

## 2016-01-13 DIAGNOSIS — F329 Major depressive disorder, single episode, unspecified: Secondary | ICD-10-CM

## 2016-01-13 DIAGNOSIS — F32A Depression, unspecified: Secondary | ICD-10-CM

## 2016-01-25 ENCOUNTER — Ambulatory Visit (INDEPENDENT_AMBULATORY_CARE_PROVIDER_SITE_OTHER): Payer: Medicare Other | Admitting: Pharmacist Clinician (PhC)/ Clinical Pharmacy Specialist

## 2016-01-25 DIAGNOSIS — Z7901 Long term (current) use of anticoagulants: Secondary | ICD-10-CM | POA: Diagnosis not present

## 2016-01-25 DIAGNOSIS — I480 Paroxysmal atrial fibrillation: Secondary | ICD-10-CM

## 2016-01-25 LAB — POCT INR: INR: 2.6

## 2016-02-01 ENCOUNTER — Other Ambulatory Visit: Payer: Self-pay | Admitting: Family Medicine

## 2016-02-01 DIAGNOSIS — F329 Major depressive disorder, single episode, unspecified: Secondary | ICD-10-CM

## 2016-02-01 DIAGNOSIS — I10 Essential (primary) hypertension: Secondary | ICD-10-CM

## 2016-02-01 DIAGNOSIS — F32A Depression, unspecified: Secondary | ICD-10-CM

## 2016-02-06 ENCOUNTER — Other Ambulatory Visit: Payer: Self-pay | Admitting: Family Medicine

## 2016-02-06 DIAGNOSIS — I10 Essential (primary) hypertension: Secondary | ICD-10-CM

## 2016-02-06 DIAGNOSIS — F329 Major depressive disorder, single episode, unspecified: Secondary | ICD-10-CM

## 2016-02-06 DIAGNOSIS — F32A Depression, unspecified: Secondary | ICD-10-CM

## 2016-02-09 DIAGNOSIS — Z23 Encounter for immunization: Secondary | ICD-10-CM | POA: Diagnosis not present

## 2016-02-21 ENCOUNTER — Ambulatory Visit (INDEPENDENT_AMBULATORY_CARE_PROVIDER_SITE_OTHER): Payer: Medicare Other | Admitting: Pharmacist Clinician (PhC)/ Clinical Pharmacy Specialist

## 2016-02-21 DIAGNOSIS — Z7901 Long term (current) use of anticoagulants: Secondary | ICD-10-CM | POA: Diagnosis not present

## 2016-02-21 DIAGNOSIS — I480 Paroxysmal atrial fibrillation: Secondary | ICD-10-CM | POA: Diagnosis not present

## 2016-02-21 LAB — POCT INR: INR: 2.2

## 2016-03-02 IMAGING — NM NM MISC PROCEDURE
6 series · 36 of 36 positions shown · non-contrast
Comparison: none

[Series 1: wbr_r-proj_st wbr rest · 6.40mm/px · 6 of 64 frames shown]
[frame 6/64]
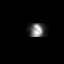
[frame 16/64]
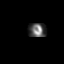
[frame 27/64]
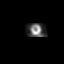
[frame 38/64]
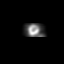
[frame 48/64]
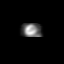
[frame 59/64]
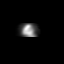

[Series 1: wbr rest · 6.40mm/px · 6 of 64 frames shown]
[frame 6/64]
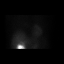
[frame 16/64]
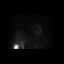
[frame 27/64]
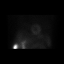
[frame 38/64]
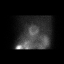
[frame 48/64]
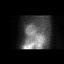
[frame 59/64]
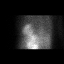

[Series 2: wbr_s-proj_st wbr stress-gsp · 6.40mm/px · 6 of 512 frames shown]
[frame 43/512]
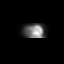
[frame 128/512]
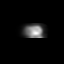
[frame 214/512]
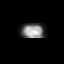
[frame 299/512]
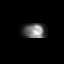
[frame 384/512]
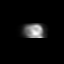
[frame 470/512]
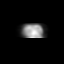

[Series 2: wbr stress-gsp · 6.40mm/px · 6 of 512 frames shown]
[frame 43/512]
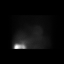
[frame 128/512]
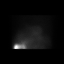
[frame 214/512]
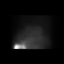
[frame 299/512]
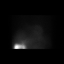
[frame 384/512]
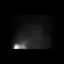
[frame 470/512]
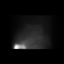

[Series 3: wbr stress-sum-em · 6.40mm/px · 6 of 64 frames shown]
[frame 6/64]
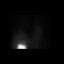
[frame 16/64]
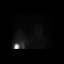
[frame 27/64]
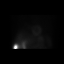
[frame 38/64]
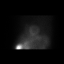
[frame 48/64]
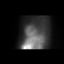
[frame 59/64]
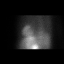

[Series 3: wbr_s-proj_st wbr stress-sum-em · 6.40mm/px · 6 of 64 frames shown]
[frame 6/64]
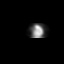
[frame 16/64]
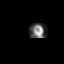
[frame 27/64]
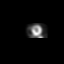
[frame 38/64]
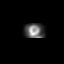
[frame 48/64]
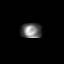
[frame 59/64]
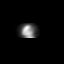

[36 of 36 positions shown; findings below may reference images not displayed]

Canned report from images found in remote index.

Refer to host system for actual result text.

## 2016-03-20 ENCOUNTER — Ambulatory Visit (INDEPENDENT_AMBULATORY_CARE_PROVIDER_SITE_OTHER): Payer: Medicare Other | Admitting: Pharmacist

## 2016-03-20 DIAGNOSIS — Z7901 Long term (current) use of anticoagulants: Secondary | ICD-10-CM

## 2016-03-20 DIAGNOSIS — I480 Paroxysmal atrial fibrillation: Secondary | ICD-10-CM | POA: Diagnosis not present

## 2016-03-20 LAB — POCT INR: INR: 2.9

## 2016-03-25 ENCOUNTER — Other Ambulatory Visit: Payer: Self-pay | Admitting: Internal Medicine

## 2016-03-28 ENCOUNTER — Encounter (HOSPITAL_COMMUNITY): Payer: Self-pay | Admitting: Internal Medicine

## 2016-04-14 ENCOUNTER — Other Ambulatory Visit: Payer: Self-pay | Admitting: Family Medicine

## 2016-04-14 DIAGNOSIS — F329 Major depressive disorder, single episode, unspecified: Secondary | ICD-10-CM

## 2016-04-14 DIAGNOSIS — F32A Depression, unspecified: Secondary | ICD-10-CM

## 2016-04-22 ENCOUNTER — Ambulatory Visit (INDEPENDENT_AMBULATORY_CARE_PROVIDER_SITE_OTHER): Payer: Medicare Other | Admitting: Pharmacist Clinician (PhC)/ Clinical Pharmacy Specialist

## 2016-04-22 DIAGNOSIS — Z7901 Long term (current) use of anticoagulants: Secondary | ICD-10-CM

## 2016-04-22 DIAGNOSIS — I480 Paroxysmal atrial fibrillation: Secondary | ICD-10-CM

## 2016-04-22 LAB — POCT INR: INR: 2.8

## 2016-05-15 ENCOUNTER — Other Ambulatory Visit: Payer: Self-pay | Admitting: Internal Medicine

## 2016-05-15 DIAGNOSIS — I739 Peripheral vascular disease, unspecified: Secondary | ICD-10-CM

## 2016-05-17 ENCOUNTER — Other Ambulatory Visit: Payer: Self-pay | Admitting: Internal Medicine

## 2016-05-17 DIAGNOSIS — I10 Essential (primary) hypertension: Secondary | ICD-10-CM

## 2016-05-19 ENCOUNTER — Other Ambulatory Visit: Payer: Self-pay | Admitting: Internal Medicine

## 2016-05-19 DIAGNOSIS — E785 Hyperlipidemia, unspecified: Secondary | ICD-10-CM

## 2016-05-21 ENCOUNTER — Ambulatory Visit (HOSPITAL_COMMUNITY)
Admission: RE | Admit: 2016-05-21 | Discharge: 2016-05-21 | Disposition: A | Discharge status: Home / Self Care | Payer: Medicare Other | Source: Ambulatory Visit | Attending: Cardiology | Admitting: Cardiology

## 2016-05-21 DIAGNOSIS — I739 Peripheral vascular disease, unspecified: Secondary | ICD-10-CM | POA: Diagnosis not present

## 2016-05-22 ENCOUNTER — Ambulatory Visit (HOSPITAL_COMMUNITY)
Admission: RE | Admit: 2016-05-22 | Discharge: 2016-05-22 | Disposition: A | Discharge status: Home / Self Care | Payer: Medicare Other | Source: Ambulatory Visit | Attending: Cardiovascular Disease | Admitting: Cardiovascular Disease

## 2016-05-22 ENCOUNTER — Other Ambulatory Visit: Payer: Self-pay | Admitting: *Deleted

## 2016-05-22 DIAGNOSIS — I10 Essential (primary) hypertension: Secondary | ICD-10-CM | POA: Diagnosis not present

## 2016-05-22 DIAGNOSIS — I701 Atherosclerosis of renal artery: Secondary | ICD-10-CM | POA: Diagnosis not present

## 2016-05-22 DIAGNOSIS — I739 Peripheral vascular disease, unspecified: Secondary | ICD-10-CM

## 2016-05-26 ENCOUNTER — Other Ambulatory Visit: Payer: Self-pay | Admitting: Internal Medicine

## 2016-05-26 DIAGNOSIS — E785 Hyperlipidemia, unspecified: Secondary | ICD-10-CM

## 2016-05-28 NOTE — Telephone Encounter (Signed)
REFILL 

## 2016-05-29 ENCOUNTER — Ambulatory Visit (INDEPENDENT_AMBULATORY_CARE_PROVIDER_SITE_OTHER): Payer: Medicare Other | Admitting: Pharmacist

## 2016-05-29 ENCOUNTER — Other Ambulatory Visit: Payer: Self-pay | Admitting: *Deleted

## 2016-05-29 DIAGNOSIS — Z7901 Long term (current) use of anticoagulants: Secondary | ICD-10-CM | POA: Diagnosis not present

## 2016-05-29 DIAGNOSIS — I480 Paroxysmal atrial fibrillation: Secondary | ICD-10-CM | POA: Diagnosis not present

## 2016-05-29 DIAGNOSIS — I701 Atherosclerosis of renal artery: Secondary | ICD-10-CM

## 2016-05-29 LAB — POCT INR: INR: 2.5

## 2016-06-20 ENCOUNTER — Other Ambulatory Visit: Payer: Self-pay | Admitting: Internal Medicine

## 2016-06-25 ENCOUNTER — Other Ambulatory Visit: Payer: Self-pay | Admitting: *Deleted

## 2016-06-25 MED ORDER — VALSARTAN 320 MG PO TABS: 320.0000 mg | ORAL_TABLET | Freq: Every day | ORAL | 0 refills | Status: DC

## 2016-06-25 NOTE — Telephone Encounter (Signed)
PT WILL BE MAKING APPT AS SHE IS AWARE SHE NEEDS ONE BUT SHE IS OPUT OF THIS MEDICATION VALSARTAN, GAVE HER 15 UNTIL APPT.

## 2016-06-27 ENCOUNTER — Encounter: Payer: Self-pay | Admitting: Internal Medicine

## 2016-06-27 ENCOUNTER — Ambulatory Visit (INDEPENDENT_AMBULATORY_CARE_PROVIDER_SITE_OTHER): Payer: Medicare Other | Admitting: Internal Medicine

## 2016-06-27 ENCOUNTER — Ambulatory Visit (INDEPENDENT_AMBULATORY_CARE_PROVIDER_SITE_OTHER): Payer: Medicare Other | Admitting: Pharmacist Clinician (PhC)/ Clinical Pharmacy Specialist

## 2016-06-27 ENCOUNTER — Other Ambulatory Visit: Payer: Self-pay | Admitting: *Deleted

## 2016-06-27 VITALS — BP 110/70 | HR 77 | Ht 66.0 in | Wt 173.0 lb

## 2016-06-27 DIAGNOSIS — E785 Hyperlipidemia, unspecified: Secondary | ICD-10-CM | POA: Diagnosis not present

## 2016-06-27 DIAGNOSIS — I4891 Unspecified atrial fibrillation: Secondary | ICD-10-CM

## 2016-06-27 DIAGNOSIS — I480 Paroxysmal atrial fibrillation: Secondary | ICD-10-CM

## 2016-06-27 DIAGNOSIS — I701 Atherosclerosis of renal artery: Secondary | ICD-10-CM | POA: Diagnosis not present

## 2016-06-27 DIAGNOSIS — Z7901 Long term (current) use of anticoagulants: Secondary | ICD-10-CM

## 2016-06-27 DIAGNOSIS — Z79899 Other long term (current) drug therapy: Secondary | ICD-10-CM | POA: Diagnosis not present

## 2016-06-27 DIAGNOSIS — I739 Peripheral vascular disease, unspecified: Secondary | ICD-10-CM

## 2016-06-27 LAB — COMPREHENSIVE METABOLIC PANEL
ALBUMIN: 3.9 g/dL (ref 3.6–5.1)
ALT: 35 U/L — ABNORMAL HIGH (ref 6–29)
AST: 31 U/L (ref 10–35)
Alkaline Phosphatase: 78 U/L (ref 33–130)
BILIRUBIN TOTAL: 0.8 mg/dL (ref 0.2–1.2)
BUN: 26 mg/dL — ABNORMAL HIGH (ref 7–25)
CHLORIDE: 108 mmol/L (ref 98–110)
CO2: 24 mmol/L (ref 20–31)
CREATININE: 1.05 mg/dL — AB (ref 0.60–0.93)
Calcium: 9.1 mg/dL (ref 8.6–10.4)
Glucose, Bld: 96 mg/dL (ref 65–99)
Potassium: 3.9 mmol/L (ref 3.5–5.3)
SODIUM: 140 mmol/L (ref 135–146)
TOTAL PROTEIN: 6.9 g/dL (ref 6.1–8.1)

## 2016-06-27 LAB — LIPID PANEL
CHOLESTEROL: 140 mg/dL (ref <200)
HDL: 54 mg/dL (ref >50)
LDL Cholesterol: 67 mg/dL (ref <100)
TRIGLYCERIDES: 93 mg/dL (ref <150)
Total CHOL/HDL Ratio: 2.6 Ratio (ref <5.0)
VLDL: 19 mg/dL (ref <30)

## 2016-06-27 LAB — POCT INR: INR: 3.9

## 2016-06-27 MED ORDER — VALSARTAN 320 MG PO TABS: 320.0000 mg | ORAL_TABLET | Freq: Every day | ORAL | 3 refills | Status: DC

## 2016-06-27 MED ORDER — OMEPRAZOLE 20 MG PO CPDR: 20.0000 mg | DELAYED_RELEASE_CAPSULE | Freq: Every day | ORAL | 1 refills | Status: DC

## 2016-06-27 NOTE — Telephone Encounter (Signed)
Omeprazole refill sent.

## 2016-06-27 NOTE — Patient Instructions (Signed)
Your physician recommends that you continue on your current medications as directed. Please refer to the Current Medication list given to you today.  Your physician recommends that you return for lab work TODAY - lipid, CMET  Your physician wants you to follow-up in: ONE YEAR with Dr. Debara Pickett. You will receive a reminder letter in the mail two months in advance. If you don't receive a letter, please call our office to schedule the follow-up appointment.

## 2016-06-27 NOTE — Progress Notes (Signed)
OFFICE NOTE  Chief Complaint:  Follow-up studies  Primary Care Physician: Howard Pouch, DO  HPI:  Kathy Hobbs is a pleasant 76 year old female who is kindly referred to me for establishing cardiac care. Kathy Hobbs has not previously seen a cardiologist, but has had several years of atrial fibrillation. Per her report she says it comes and goes although she was noted to be in A. fib today. She was placed on warfarin by her primary care provider in [place] prior to moving up to live with her daughter. Her INR was recently checked by her PCP at 3.1 and mild adjustments were made to her medicines. It was requested that we follow her warfarin and manage it in our office. Kathy Hobbs also has a significant history of PAD. She recently had carotid Dopplers apparently by her neurologist in [place] which showed a 40% right carotid artery stenosis. She also has a history of PAD and peripheral stents as well as bilateral renal artery stenosis status post PCI about one year ago by Dr. Sullivan Lone in [facility]. She was previously seeing Dr. Jackalyn Lombard, with the [facility] in [place]. She currently reports fatigue and some shortness of breath with exertion. She denies any chest pain. She's never had an ischemia evaluation to her knowledge. Recently he's been having some falls and in fact fell and fractured her right humerus. That is very slow to heal. She is also reporting some pain when walking and swelling of her legs.  Kathy Hobbs returns today for follow-up. Unfortunately we have not been able to locate records from her cardiologist in [facility]. She subsequently underwent lower extremity arterial Dopplers as well as renal artery Dopplers here. This demonstrated patent lower extremity stents as well as patent bilateral renal stents. She underwent stress testing as well which demonstrated no ischemia and LVEF which was preserved. She seems to be tolerating warfarin without  bleeding problems although has been having some falls. In fact she fell twice last week. I am concerned about this and her daughter apparently is working on trying to come up with the cause of her falls. Again in the office today, we discussed the possibility of a Watchman device which is a left atrial appendage occluder if she were not to be candidate for warfarin therapy long-term.   06/27/2016  Kathy Hobbs returns today for follow-up. She's done well over the last year. She recently had carotid and lower extremity arterial Dopplers in December which were stable. She does have a right carotid bruit. She reports that her falls have improved significantly. She saw a neurologist a couple times and went through some therapy. I think it safe for her to continue on warfarin for now. INR was slightly had arranged today and will be adjusted. She has been taking atorvastatin and is well overdue for repeat lipid profile. Was ordered last year but never obtained. Although she is not fasting today, we'll send her for lab work. There has been about 11 pound weight gain. I counseled her on trying to cut back on food intake and more exercise.  PMHx:  Past Medical History:  Diagnosis Date  . Atrial fibrillation ([place])   . Breast cancer ([place])   . Depression   . Hyperlipidemia   . Hypertension   . Right humeral fracture 2016  . Urine incontinence   . Vertigo     Past Surgical History:  Procedure Laterality Date  . BREAST BIOPSY    . BREAST LUMPECTOMY    .  FEMORAL ARTERY STENT     ? pt states "Stent in my leg"  . RENAL ARTERY STENT Bilateral     FAMHx:  Family History  Problem Relation Age of Onset  . Heart attack Father   . Alcohol abuse Brother   . Breast cancer Paternal Grandmother   . Depression Mother   . Alcoholism Brother   . Depression Brother     SOCHx:   reports that she has never smoked. She has never used smokeless tobacco. She reports that she does not drink alcohol or use  drugs.  ALLERGIES:  No Known Allergies  ROS: A comprehensive review of systems was negative.  HOME MEDS: Current Outpatient Prescriptions  Medication Sig Dispense Refill  . atorvastatin (LIPITOR) 80 MG tablet take 1 tablet by mouth once daily 90 tablet 0  . cholecalciferol (VITAMIN D) 1000 units tablet Take 1,000 Units by mouth daily.    . citalopram (CELEXA) 20 MG tablet take 1 tablet by mouth daily 90 tablet 1  . omeprazole (PRILOSEC) 20 MG capsule Take 1 capsule (20 mg total) by mouth daily. 90 capsule 1  . sertraline (ZOLOFT) 50 MG tablet take 1 tablet by mouth once daily 90 tablet 0  . spironolactone (ALDACTONE) 25 MG tablet take 1 tablet by mouth once daily 90 tablet 1  . valsartan (DIOVAN) 320 MG tablet Take 1 tablet (320 mg total) by mouth daily. 15 tablet 0  . warfarin (COUMADIN) 2 MG tablet take 1 tablet by mouth daily or as directed 90 tablet 0   No current facility-administered medications for this visit.     LABS/IMAGING: No results found for this or any previous visit (from the past 48 hour(s)). No results found.  WEIGHTS: Wt Readings from Last 3 Encounters:  06/27/16 173 lb (78.5 kg)  12/01/15 167 lb 12 oz (76.1 kg)  11/24/15 165 lb (74.8 kg)    VITALS: BP 110/70 (BP Location: Left Arm, Patient Position: Sitting, Cuff Size: Normal)   Pulse 77   Ht 5\' 6"  (1.676 m)   Wt 173 lb (78.5 kg)   BMI 27.92 kg/m   EXAM: General appearance: alert and no distress Neck: no carotid bruit and no JVD Lungs: clear to auscultation bilaterally Heart: irregularly irregular rhythm Abdomen: soft, non-tender; bowel sounds normal; no masses,  no organomegaly Extremities: extremities normal, atraumatic, no cyanosis or edema Pulses: 2+ and symmetric Skin: Skin color, texture, turgor normal. No rashes or lesions Neurologic: Grossly normal Psych: Pleasant  EKG: Atrial fibrillation at 77  ASSESSMENT: 1. Atrial fibrillation possibly paroxysmal, asymptomatic- CHADSVASC  3 2. Hypertension-controlled 3. PAD-status post peripheral stenting - normal ABI's and patent stents (04/2015) 4. Bilateral renal artery stenosis status post bilateral stenting (patent stents 04/2015) 5. Carotid artery disease with 40% R ICA stenosis 6. History of dyslipidemia-on lipitor 7. History of falls - improved with therapy  PLAN: 1.   Kathy Hobbs is doing very well. She remains in A. fib which is more persistent at this point. She is asymptomatic with this. She's been therapeutic on warfarin. Blood pressure is now well controlled. Her PAD is stable based on Dopplers in December. She is on atorvastatin 80 mg and has not had a repeat lipid profile. She did not get a blood test I ordered last time. Although she is not fasting today, but to send her for a repeat lipid profile so that we can demonstrate hopefully improvement in her cholesterol.  Follow-up annually.  Pixie Casino, MD, [facility] Attending Cardiologist [facility]  Pixie Casino 06/27/2016, 9:50 AM

## 2016-07-11 ENCOUNTER — Ambulatory Visit (INDEPENDENT_AMBULATORY_CARE_PROVIDER_SITE_OTHER): Payer: Medicare Other | Admitting: Pharmacist

## 2016-07-11 DIAGNOSIS — I480 Paroxysmal atrial fibrillation: Secondary | ICD-10-CM | POA: Diagnosis not present

## 2016-07-11 DIAGNOSIS — I4891 Unspecified atrial fibrillation: Secondary | ICD-10-CM | POA: Diagnosis not present

## 2016-07-11 DIAGNOSIS — I701 Atherosclerosis of renal artery: Secondary | ICD-10-CM | POA: Diagnosis not present

## 2016-07-11 DIAGNOSIS — Z7901 Long term (current) use of anticoagulants: Secondary | ICD-10-CM | POA: Diagnosis not present

## 2016-07-11 LAB — POCT INR: INR: 3

## 2016-07-16 ENCOUNTER — Encounter: Payer: Self-pay | Admitting: *Deleted

## 2016-07-16 ENCOUNTER — Other Ambulatory Visit: Payer: Self-pay | Admitting: Family Medicine

## 2016-07-16 ENCOUNTER — Telehealth: Payer: Self-pay | Admitting: Family Medicine

## 2016-07-16 DIAGNOSIS — F329 Major depressive disorder, single episode, unspecified: Secondary | ICD-10-CM

## 2016-07-16 DIAGNOSIS — F32A Depression, unspecified: Secondary | ICD-10-CM

## 2016-07-16 NOTE — Telephone Encounter (Signed)
[facility] is calling to advise of a potential drug interaction of sertraline and citalopram. Please give them a call to clarify.

## 2016-07-16 NOTE — Telephone Encounter (Signed)
Spoke with pharmacy patient has been on these medications. OK to dispense.

## 2016-07-24 ENCOUNTER — Ambulatory Visit (INDEPENDENT_AMBULATORY_CARE_PROVIDER_SITE_OTHER): Payer: Medicare Other | Admitting: Family Medicine

## 2016-07-24 ENCOUNTER — Ambulatory Visit: Payer: Medicare Other | Admitting: Family Medicine

## 2016-07-24 ENCOUNTER — Encounter: Payer: Self-pay | Admitting: Family Medicine

## 2016-07-24 DIAGNOSIS — F339 Major depressive disorder, recurrent, unspecified: Secondary | ICD-10-CM | POA: Diagnosis not present

## 2016-07-24 MED ORDER — SERTRALINE HCL 50 MG PO TABS: 50.0000 mg | ORAL_TABLET | Freq: Every day | ORAL | 1 refills | Status: DC

## 2016-07-24 MED ORDER — CITALOPRAM HYDROBROMIDE 20 MG PO TABS: 20.0000 mg | ORAL_TABLET | Freq: Every day | ORAL | 1 refills | Status: DC

## 2016-07-24 NOTE — Progress Notes (Signed)
Patient ID: Kathy Hobbs, female   DOB: 27-Sep-1940, 76 y.o.   MRN: [id number]   Subjective:    Patient ID: Kathy Hobbs, female    DOB: [birth year], 76 y.o.   MRN: [id number]  HPI  Depression:  Pt presents today for f/u depression. She reports compliance with celexa and zoloft daily. She denies any side effetcs. She states she is feeling quite good on medications. She is finally in her new home and Sunday is her 75 nd wedding anniversary.    Past Medical History:  Diagnosis Date  . Atrial fibrillation ([place])   . Breast cancer ([place])   . Depression   . Hyperlipidemia   . Hypertension   . Right humeral fracture 2016  . Urine incontinence   . Vertigo    No Known Allergies  Review of Systems  Psychiatric/Behavioral: Positive for depression.   Negative, with the exception of above mentioned in HPI      Objective:   Physical Exam BP 138/71 (BP Location: Left Arm, Patient Position: Sitting, Cuff Size: Large)   Pulse 80   Temp 98.1 F (36.7 C)   Resp 20   Wt 171 lb (77.6 kg)   SpO2 98%   BMI 27.60 kg/m  Gen: Afebrile. No acute distress.  HENT: AT. [place].  MMM.  Eyes:Pupils Equal Round Reactive to light, Extraocular movements intact,  Conjunctiva without redness, discharge or icterus. CV: irreg irreg, no edema Chest: CTAB, no wheeze or crackles  Neuro:  Normal gait. PERLA. EOMi. Alert. Oriented.  Psych: Normal affect, dress and demeanor. Normal speech. Normal thought content and judgment..      Assessment & Plan:  Kathy Hobbs is a 76 y.o.  Depression - stable today, doing well.  - continue celexa and zoloft, refills provided.  - citalopram (CELEXA) 20 MG tablet; Take 1 tablet (20 mg total) by mouth daily.  Dispense: 90 tablet; Refill: 1 - sertraline (ZOLOFT) 50 MG tablet; 50 mg QD.  Dispense: 90 tablet; Refill: 1 - F/U 6 months, sooner if needed   Electronically Signed by: Howard Pouch, DO [place] [facility] [place]

## 2016-07-24 NOTE — Patient Instructions (Signed)
I mam glad you are doing so well. You look great today. Follow up every 6 months here with depression.   Please help [place] help you:  We are honored you have chosen [facility] for your Primary Care home. Below you will find basic instructions that you may need to access in the future. Please help [place] help you by reading the instructions, which cover many of the frequent questions we experience.   Prescription refills and request:  -In order to allow more efficient response time, please call your pharmacy for all refills. They will forward the request electronically to [place]. This allows for the quickest possible response. Request left on a nurse line can take longer to refill, since these are checked as time allows between office patients and other phone calls.  - refill request can take up to 3-5 working days to complete.  - If request is sent electronically and request is appropiate, it is usually completed in 1-2 business days.  - all patients will need to be seen routinely for all chronic medical conditions requiring prescription medications (see follow-up below). If you are overdue for follow up on your condition, you will be asked to make an appointment and we will call in enough medication to cover you until your appointment (up to 30 days).  - all controlled substances will require a face to face visit to request/refill.  - if you desire your prescriptions to go through a new pharmacy, and have an active script at original pharmacy, you will need to call your pharmacy and have scripts transferred to new pharmacy. This is completed between the pharmacy locations and not by your provider.    Results: If any images or labs were ordered, it can take up to 1 week to get results depending on the test ordered and the lab/facility running and resulting the test. - Normal or stable results, which do not need further discussion, will be released to your mychart immediately with attached note to you. A  call will not be generated for normal results. Please make certain to sign up for mychart. If you have questions on how to activate your mychart you can call the front office.  - If your results need further discussion, our office will attempt to contact you via phone, and if unable to reach you after 2 attempts, we will release your abnormal result to your mychart with instructions.  - All results will be automatically released in mychart after 1 week.  - Your provider will provide you with explanation and instruction on all relevant material in your results. Please keep in mind, results and labs may appear confusing or abnormal to the untrained eye, but it does not mean they are actually abnormal for you personally. If you have any questions about your results that are not covered, or you desire more detailed explanation than what was provided, you should make an appointment with your provider to do so.   Our office handles many outgoing and incoming calls daily. If we have not contacted you within 1 week about your results, please check your mychart to see if there is a message first and if not, then contact our office.  In helping with this matter, you help decrease call volume, and therefore allow [place] to be able to respond to patients needs more efficiently.   Acute office visits (sick visit):  An acute visit is intended for a new problem and are scheduled in shorter time slots to allow schedule  openings for patients with new problems. This is the appropriate visit to discuss a new problem. In order to provide you with excellent quality medical care with proper time for you to explain your problem, have an exam and receive treatment with instructions, these appointments should be limited to one new problem per visit. If you experience a new problem, in which you desire to be addressed, please make an acute office visit, we save openings on the schedule to accommodate you. Please do not save your new  problem for any other type of visit, let us take care of it properly and quickly for you.   Follow up visits:  Depending on your condition(s) your provider will need to see you routinely in order to provide you with quality care and prescribe medication(s). Most chronic conditions (Example: hypertension, Diabetes, depression/anxiety... etc), require visits a couple times a year. Your provider will instruct you on proper follow up for your personal medical conditions and history. Please make certain to make follow up appointments for your condition as instructed. Failing to do so could result in lapse in your medication treatment/refills. If you request a refill, and are overdue to be seen on a condition, we will always provide you with a 30 day script (once) to allow you time to schedule.    Medicare wellness (well visit): - we have a wonderful Nurse Maudie Mercury), that will meet with you and provide you will yearly medicare wellness visits. These visits should occur yearly (can not be scheduled less than 1 calendar year apart) and cover preventive health, immunizations, advance directives and screenings you are entitled to yearly through your medicare benefits. Do not miss out on your entitled benefits, this is when medicare will pay for these benefits to be ordered for you.  These are strongly encouraged by your provider and is the appropriate type of visit to make certain you are up to date with all preventive health benefits. If you have not had your medicare wellness exam in the last 12 months, please make certain to schedule one by calling the office and schedule your medicare wellness with Maudie Mercury as soon as possible.   Yearly physical (well visit):  - Adults are recommended to be seen yearly for physicals. Check with your insurance and date of your last physical, most insurances require one calendar year between physicals. Physicals include all preventive health topics, screenings, medical exam and labs that  are appropriate for gender/age and history. You may have fasting labs needed at this visit. This is a well visit (not a sick visit), acute topics should not be covered during this visit.  - Pediatric patients are seen more frequently when they are younger. Your provider will advise you on well child visit timing that is appropriate for your their age. - This is not a medicare wellness visit. Medicare wellness exams do not have an exam portion to the visit. Some medicare companies allow for a physical, some do not allow a yearly physical. If your medicare allows a yearly physical you can schedule the medicare wellness with our nurse Maudie Mercury and have your physical with your provider after, on the same day. Please check with insurance for your full benefits.   Late Policy/No Shows:  - all new patients should arrive 15-30 minutes earlier than appointment to allow [place] time  to  obtain all personal demographics,  insurance information and for you to complete office paperwork. - All established patients should arrive 10-15 minutes earlier than appointment time to  update all information and be checked in .  - In our best efforts to run on time, if you are late for your appointment you will be asked to either reschedule or if able, we will work you back into the schedule. There will be a wait time to work you back in the schedule,  depending on availability.  - If you are unable to make it to your appointment as scheduled, please call 24 hours ahead of time to allow [place] to fill the time slot with someone else who needs to be seen. If you do not cancel your appointment ahead of time, you may be charged a no show fee.

## 2016-08-05 ENCOUNTER — Other Ambulatory Visit: Payer: Self-pay | Admitting: Internal Medicine

## 2016-08-08 ENCOUNTER — Ambulatory Visit (INDEPENDENT_AMBULATORY_CARE_PROVIDER_SITE_OTHER): Payer: Medicare Other | Admitting: Pharmacist

## 2016-08-08 DIAGNOSIS — I4891 Unspecified atrial fibrillation: Secondary | ICD-10-CM

## 2016-08-08 DIAGNOSIS — Z7901 Long term (current) use of anticoagulants: Secondary | ICD-10-CM

## 2016-08-08 DIAGNOSIS — I480 Paroxysmal atrial fibrillation: Secondary | ICD-10-CM | POA: Diagnosis not present

## 2016-08-08 DIAGNOSIS — I701 Atherosclerosis of renal artery: Secondary | ICD-10-CM

## 2016-08-08 LAB — POCT INR: INR: 3.7

## 2016-09-04 ENCOUNTER — Ambulatory Visit (INDEPENDENT_AMBULATORY_CARE_PROVIDER_SITE_OTHER): Payer: Medicare Other | Admitting: Pharmacist

## 2016-09-04 DIAGNOSIS — I480 Paroxysmal atrial fibrillation: Secondary | ICD-10-CM

## 2016-09-04 DIAGNOSIS — Z7901 Long term (current) use of anticoagulants: Secondary | ICD-10-CM | POA: Diagnosis not present

## 2016-09-04 DIAGNOSIS — I701 Atherosclerosis of renal artery: Secondary | ICD-10-CM

## 2016-09-04 DIAGNOSIS — I4891 Unspecified atrial fibrillation: Secondary | ICD-10-CM

## 2016-09-04 LAB — POCT INR: INR: 2.2

## 2016-10-10 ENCOUNTER — Ambulatory Visit (INDEPENDENT_AMBULATORY_CARE_PROVIDER_SITE_OTHER): Payer: Medicare Other | Admitting: Pharmacist

## 2016-10-10 DIAGNOSIS — I4891 Unspecified atrial fibrillation: Secondary | ICD-10-CM

## 2016-10-10 DIAGNOSIS — I701 Atherosclerosis of renal artery: Secondary | ICD-10-CM

## 2016-10-10 DIAGNOSIS — I480 Paroxysmal atrial fibrillation: Secondary | ICD-10-CM

## 2016-10-10 DIAGNOSIS — Z7901 Long term (current) use of anticoagulants: Secondary | ICD-10-CM | POA: Diagnosis not present

## 2016-10-10 LAB — POCT INR: INR: 2.9

## 2016-11-21 ENCOUNTER — Ambulatory Visit (INDEPENDENT_AMBULATORY_CARE_PROVIDER_SITE_OTHER): Payer: Medicare Other | Admitting: Pharmacist Clinician (PhC)/ Clinical Pharmacy Specialist

## 2016-11-21 DIAGNOSIS — I4891 Unspecified atrial fibrillation: Secondary | ICD-10-CM

## 2016-11-21 DIAGNOSIS — I701 Atherosclerosis of renal artery: Secondary | ICD-10-CM

## 2016-11-21 DIAGNOSIS — I480 Paroxysmal atrial fibrillation: Secondary | ICD-10-CM | POA: Diagnosis not present

## 2016-11-21 DIAGNOSIS — Z7901 Long term (current) use of anticoagulants: Secondary | ICD-10-CM

## 2016-11-21 LAB — POCT INR: INR: 2.4

## 2016-12-16 ENCOUNTER — Ambulatory Visit (INDEPENDENT_AMBULATORY_CARE_PROVIDER_SITE_OTHER): Payer: Medicare Other | Admitting: Family Medicine

## 2016-12-16 ENCOUNTER — Encounter: Payer: Self-pay | Admitting: Family Medicine

## 2016-12-16 VITALS — BP 107/68 | HR 71 | Temp 98.1°F | Resp 20 | Wt 171.8 lb

## 2016-12-16 DIAGNOSIS — W19XXXA Unspecified fall, initial encounter: Secondary | ICD-10-CM | POA: Diagnosis not present

## 2016-12-16 DIAGNOSIS — N183 Chronic kidney disease, stage 3 unspecified: Secondary | ICD-10-CM

## 2016-12-16 DIAGNOSIS — R203 Hyperesthesia: Secondary | ICD-10-CM | POA: Diagnosis not present

## 2016-12-16 DIAGNOSIS — M6281 Muscle weakness (generalized): Secondary | ICD-10-CM | POA: Diagnosis not present

## 2016-12-16 DIAGNOSIS — R269 Unspecified abnormalities of gait and mobility: Secondary | ICD-10-CM | POA: Diagnosis not present

## 2016-12-16 DIAGNOSIS — G629 Polyneuropathy, unspecified: Secondary | ICD-10-CM | POA: Diagnosis not present

## 2016-12-16 NOTE — Patient Instructions (Signed)
I will place the order for PT to restart  Please make an appt to schedule with Neurology.   I will call you with lab results

## 2016-12-16 NOTE — Progress Notes (Signed)
Kathy Hobbs , 07/07/1940, 76 y.o., female MRN: [id number] Patient Care Team    Relationship Specialty Notifications Start End  Ma Hillock, DO PCP - General Family Medicine  02/28/15     Chief Complaint  Patient presents with  . Fall    unsteady,gen weakness     Subjective: Pt presents for an OV with complaints of Unsteadiness/gait disturbance/fall/muscle weakness of a few months duration.  Her husband and her daughter are present with her today and also add to the history. Family feels that it has been a slow progression over the past 6 months that she has returned back to her unsteady gait and muscle weakness. She had been doing quite well when performing physical therapy. Patient had a fall in the bathroom yesterday. She states she was trying to squat down on the toilet, and lost her balance. When she was on the floor, she didn't have an extra strength to pull herself up. She denies any injury during that time. 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 She reports her vertigo is worse when rolling over to get out of bed in the morning. She has exercises to help with this, but her husband states she does not do them. She was referred to neurology last year for similar presentation, she only went once. The workup they desired to do did not get a chance to be completed. Family states that she is not as motivated as prior to keep her strength/exercise. She prefers to watch TV and read books. She had been walking on a treadmill, and doing quite well up until a few months ago. She also endorses feeling a increased odd sensation/tingling sensation on the crown of her head, but also endorses some thinning hair at that location. Husband states she stopped taking all of her supplements.  Depression screen [facility] 2/9 07/24/2016 07/13/2015  Decreased Interest 0 0  Down, Depressed, Hopeless 0 0  PHQ - 2 Score 0 0    No Known Allergies Social History  Substance Use  Topics  . Smoking status: Never Smoker  . Smokeless tobacco: Never Used  . Alcohol use No   Past Medical History:  Diagnosis Date  . Atrial fibrillation ([place])   . Breast cancer ([place])   . Depression   . Hyperlipidemia   . Hypertension   . Right humeral fracture 2016  . Urine incontinence   . Vertigo    Past Surgical History:  Procedure Laterality Date  . BREAST BIOPSY    . BREAST LUMPECTOMY    . FEMORAL ARTERY STENT     ? pt states "Stent in my leg"  . RENAL ARTERY STENT Bilateral    Family History  Problem Relation Age of Onset  . Heart attack Father   . Depression Mother   . Alcohol abuse Brother   . Breast cancer Paternal Grandmother   . Alcoholism Brother   . Depression Brother    Allergies as of 12/16/2016   No Known Allergies     Medication List       Accurate as of 12/16/16  6:25 PM. Always use your most recent med list.          atorvastatin 80 MG tablet Commonly known as:  LIPITOR take 1 tablet by mouth once daily   cholecalciferol 1000 units tablet Commonly known as:  VITAMIN D Take 1,000 Units by mouth daily.   citalopram 20 MG tablet Commonly known as:  CELEXA Take  1 tablet (20 mg total) by mouth daily.   CLARITIN 10 MG Caps Generic drug:  Loratadine Take by mouth.   sertraline 50 MG tablet Commonly known as:  ZOLOFT Take 1 tablet (50 mg total) by mouth daily.   spironolactone 25 MG tablet Commonly known as:  ALDACTONE take 1 tablet by mouth once daily   valsartan 320 MG tablet Commonly known as:  DIOVAN Take 1 tablet (320 mg total) by mouth daily.   warfarin 2 MG tablet Commonly known as:  COUMADIN Take 1/2 to 1 tablet daily as directed by coumadin clinic       All past medical history, surgical history, allergies, family history, immunizations andmedications were updated in the EMR today and reviewed under the history and medication portions of their EMR.     ROS: Negative, with the exception of above mentioned in  HPI   Objective:  BP 107/68 (BP Location: Right Arm, Patient Position: Sitting, Cuff Size: Large)   Pulse 71   Temp 98.1 F (36.7 C)   Resp 20   Wt 171 lb 12 oz (77.9 kg)   SpO2 98%   BMI 27.72 kg/m  Body mass index is 27.72 kg/m. Gen: Afebrile. No acute distress. Nontoxic in appearance, well developed, well nourished.  HENT: AT. [place].  MMM, no oral lesions. Eyes:Pupils Equal Round Reactive to light, Extraocular movements intact,  Conjunctiva without redness, discharge or icterus. Neck/lymp/endocrine: Supple,No lymphadenopathy CV: irregularly irregular. No edema Chest: CTAB, no wheeze or crackles. Good air movement, normal resp effort.  Neuro/MSK: Normal gait. PERLA. EOMi. Alert. Oriented x3 Cranial nerves II through XII intact. Muscle strength 5/5 bilateral upper extremity, 4+/5 right lower extremity flexion, otherwise 5/5. Psych: Normal affect, dress and demeanor. Normal speech. Normal thought content and judgment.  No exam data present No results found. No results found for this or any previous visit (from the past 24 hour(s)).  Assessment/Plan: Kathy Hobbs is a 76 y.o. female present for OV for  Muscle weakness/neuropathy Gait disturbance/fall -strongly encouraged to make appointment with neurology so they can continue the workup they initiated. - Referral placed for physical therapy to help with strengthening exercises. - B12 - VITAMIN D 25 Hydroxy (Vit-D Deficiency, Fractures) - TSH - Ambulatory referral to Physical Therapy Neuropathy - B12 - VITAMIN D 25 Hydroxy (Vit-D Deficiency, Fractures) - TSH Chronic kidney disease, stage 3 - VITAMIN D 25 Hydroxy (Vit-D Deficiency, Fractures) Hyperesthesia scalp - B12 Follow-up dependent on lab results    Reviewed expectations re: course of current medical issues.  Discussed self-management of symptoms.  Outlined signs and symptoms indicating need for more acute intervention.  Patient verbalized understanding  and all questions were answered.  Patient received an After-Visit Summary.    Orders Placed This Encounter  Procedures  . B12  . VITAMIN D 25 Hydroxy (Vit-D Deficiency, Fractures)  . TSH  . Ambulatory referral to Physical Therapy     Note is dictated utilizing voice recognition software. Although note has been proof read prior to signing, occasional typographical errors still can be missed. If any questions arise, please do not hesitate to call for verification.   electronically signed by:  Howard Pouch, DO  [facility]

## 2016-12-17 ENCOUNTER — Telehealth: Payer: Self-pay | Admitting: Family Medicine

## 2016-12-17 LAB — VITAMIN D 25 HYDROXY (VIT D DEFICIENCY, FRACTURES): VITD: 28.87 ng/mL — ABNORMAL LOW (ref 30.00–100.00)

## 2016-12-17 LAB — VITAMIN B12

## 2016-12-17 LAB — TSH: TSH: 5 u[IU]/mL — ABNORMAL HIGH (ref 0.35–4.50)

## 2016-12-17 NOTE — Telephone Encounter (Signed)
Please call pt: her vit d is mildly low, restart taking her supplement daily.  Her b12 looks great.  Her thyroid is just ever so mildly elevated, but could be reflective of normal fluctuation.  We would want to repeat level in 6-8 weeks and followup with her at that time. She should make appt with neuro as suggested, and I have placed the order for her PT in [place].

## 2016-12-18 ENCOUNTER — Telehealth: Payer: Self-pay | Admitting: *Deleted

## 2016-12-18 ENCOUNTER — Encounter: Payer: Self-pay | Admitting: *Deleted

## 2016-12-18 NOTE — Telephone Encounter (Signed)
Left detailed message with results and instruction on home voice mail per DPR also sent information in MY Chart.

## 2016-12-18 NOTE — Telephone Encounter (Signed)
Opened in error

## 2016-12-18 NOTE — Telephone Encounter (Signed)
Message sent in MyChart.

## 2016-12-19 ENCOUNTER — Telehealth: Payer: Self-pay | Admitting: Internal Medicine

## 2016-12-19 MED ORDER — WARFARIN SODIUM 2 MG PO TABS: ORAL_TABLET | ORAL | 0 refills | Status: DC

## 2016-12-19 NOTE — Telephone Encounter (Signed)
New message     *STAT* If patient is at the pharmacy, call can be transferred to refill team.   1. Which medications need to be refilled? (please list name of each medication and dose if known) warfarin 2 mg  2. Which pharmacy/location (including street and city if local pharmacy) is medication to be sent to? [facility] on [address] in [place]   3. Do they need a 30 day or 90 day supply? [place]

## 2017-01-02 ENCOUNTER — Encounter: Payer: Self-pay | Admitting: Rehabilitative and Restorative Service Providers"

## 2017-01-02 ENCOUNTER — Ambulatory Visit (INDEPENDENT_AMBULATORY_CARE_PROVIDER_SITE_OTHER): Payer: Medicare Other | Admitting: Rehabilitative and Restorative Service Providers"

## 2017-01-02 DIAGNOSIS — R2689 Other abnormalities of gait and mobility: Secondary | ICD-10-CM | POA: Diagnosis not present

## 2017-01-02 DIAGNOSIS — M6281 Muscle weakness (generalized): Secondary | ICD-10-CM | POA: Diagnosis present

## 2017-01-02 DIAGNOSIS — R2681 Unsteadiness on feet: Secondary | ICD-10-CM

## 2017-01-02 NOTE — Patient Instructions (Signed)
HIP: Hamstrings - Supine   Place strap around foot. Raise leg up, keeping knee straight.  Bend opposite knee to protect back if indicated. Hold 30 seconds. 3 reps per set, 2-3 sets per day   Work on standing straight and tall  Stay standing for 1-3 minutes  Choose a TV show and stand every time there is a commercial break  3-4 times a day   Try to sit is straight chairs some of the day. Out of the recliner

## 2017-01-02 NOTE — Therapy (Signed)
[place], [place], [address] Phone: [phone]   Fax:  [phone]  Physical Therapy Evaluation  Patient Details  Name: Kathy Hobbs MRN: [id number] Date of Birth: 1941/01/15 Referring Provider: Dr Brand Males  Encounter Date: 01/02/2017      PT End of Session - 01/02/17 1041    Visit Number 1   Number of Visits 12   Date for PT Re-Evaluation 02/13/17   PT Start Time 7619   PT Stop Time 1145   PT Time Calculation (min) 60 min   Activity Tolerance Patient tolerated treatment well      Past Medical History:  Diagnosis Date  . Atrial fibrillation ([place])   . Breast cancer ([place])   . Depression   . Hyperlipidemia   . Hypertension   . Right humeral fracture 2016  . Urine incontinence   . Vertigo     Past Surgical History:  Procedure Laterality Date  . BREAST BIOPSY    . BREAST LUMPECTOMY    . FEMORAL ARTERY STENT     ? pt states "Stent in my leg"  . RENAL ARTERY STENT Bilateral     There were no vitals filed for this visit.       Subjective Assessment - 01/02/17 1050    Subjective Kathy Hobbs reports that she is unstable with walking. She has fallen several times. She is unable to get up from the floor if she does fall. She feels weak and does not have any energy.    Pertinent History A-fib; arthritis; LBP on an intermittent basis; vertigo; difficulty with memory     Diagnostic tests blood work    Patient Stated Goals gain strength; to be able to get up off the ground; walk; not fall             [facility] PT Assessment - 01/02/17 0001      Assessment   Medical Diagnosis Weakness; gait disturbance   Referring Provider Dr Brand Males   Onset Date/Surgical Date 09/01/16   Hand Dominance Right   Next MD Visit 10/18   Prior Therapy yes 2016     Precautions   Precautions Fall     Balance Screen   Has the patient fallen in the past 6 months Yes   How many times? 2   Has the  patient had a decrease in activity level because of a fear of falling?  No   Is the patient reluctant to leave their home because of a fear of falling?  No     Home Environment   Additional Comments two story home - bedroom on second level      Prior Function   Level of Independence Independent with basic ADLs   Vocation Retired   Leisure some household chores; TV; reading - sedentary      Sensation   Additional Comments WFL's per pt report      Posture/Postural Control   Posture Comments head forward shoudlers rounded and elevated; flexed forward at trunk;hips      AROM   Overall AROM Comments LE ROM WFL's except hip extension to neutral only      Strength   Right Hip Flexion 4/5   Right Hip Extension 4-/5  assessed supine    Right Hip ABduction 4/5   Left Hip Flexion 4/5   Left Hip Extension 4-/5  assessed in supine    Left Hip ABduction 4/5   Right/Left Knee --  4+/5 bilat  Right/Left Ankle --  3+/5 PF; 4+/5 DF      Flexibility   Hamstrings tight bilat ~ 50 deg   ITB tight bilat    Piriformis tight bilat      Berg Balance Test   Sit to Stand Able to stand  independently using hands   Standing Unsupported Able to stand safely 2 minutes   Sitting with Back Unsupported but Feet Supported on Floor or Stool Able to sit safely and securely 2 minutes   Stand to Sit Sits safely with minimal use of hands   Transfers Able to transfer safely, definite need of hands   Standing Unsupported with Eyes Closed Able to stand 10 seconds safely   Standing Ubsupported with Feet Together Able to place feet together independently and stand 1 minute safely   From Standing, Reach Forward with Outstretched Arm Can reach confidently >25 cm (10")   From Standing Position, Pick up Object from Floor Able to pick up shoe safely and easily   From Standing Position, Turn to Look Behind Over each Shoulder Looks behind from both sides and weight shifts well   Turn 360 Degrees Able to turn 360  degrees safely but slowly   Standing Unsupported, Alternately Place Feet on Step/Stool Able to complete 4 steps without aid or supervision   Standing Unsupported, One Foot in [employer] balance while stepping or standing   Standing on One Leg Unable to try or needs assist to prevent fall   Total Score 42   Berg comment: significant risk for falling      Functional Gait  Assessment   Gait assessed  --  ambulates w/ wide based gait; unsteady             Objective measurements completed on examination: See above findings.          [facility] Adult PT Treatment/Exercise - 01/02/17 0001      Neuro Re-ed    Neuro Re-ed Details  workingon posture and alignment in standing      Knee/Hip Exercises: Stretches   Passive Hamstring Stretch 3 reps;30 seconds  supine with strap      Knee/Hip Exercises: Standing   Other Standing Knee Exercises standing working on posture and alignment 2 min x 2                 PT Education - 01/02/17 1130    Education provided Yes   Education Details HEP    Person(s) Educated Patient   Methods Explanation;Demonstration;Tactile cues;Verbal cues;Handout   Comprehension Verbalized understanding;Returned demonstration;Verbal cues required;Tactile cues required             PT Long Term Goals - 01/02/17 1141      PT LONG TERM GOAL #1   Title Improve standing posture and alignment with patient to stand with good weight bearing and balance 02/13/17   Time 6   Period Weeks   Status New     PT LONG TERM GOAL #2   Title Increase strength bilat LE's to 4+/5 to 5/5 throughout 02/13/17   Time 6   Period Weeks   Status New     PT LONG TERM GOAL #3   Title Improve gait safety and transfers at home to decrease fall risk and improve functional activity level 02/13/17   Time 6   Period Weeks   Status New     PT LONG TERM GOAL #4   Title Improve Berg Balance Scale score indicatiing decreased fall risk 02/13/17  Time 6   Period Weeks   Status  New     PT LONG TERM GOAL #5   Title Independent in HEP 02/13/17   Time 6   Period Weeks   Status New                Plan - 01/23/17 1136    Clinical Impression Statement Kathy Hobbs presents with several month history of increased weakness and two falls. She reports that she is unable to get up when she falls. Kathy Hobbs has poor posture and alignment; tightness through LE's and trunk; LE weakness; abnormal gait pattern; fear of falling. Kathy Hobbs will benefit from PT to address problems identified.    Clinical Presentation Evolving   Clinical Decision Making Low   Clinical Impairments Affecting Rehab Potential declining health/activity level; sedentary lifestyle; fall risk    PT Frequency 2x / week   PT Duration 6 weeks   PT Treatment/Interventions Patient/family education;ADLs/Self Care Home Management;Cryotherapy;Electrical Stimulation;Moist Heat;Ultrasound;Dry needling;Manual techniques;Therapeutic activities;Therapeutic exercise;Balance training;Neuromuscular re-education;Gait training   PT Next Visit Plan strengthening exercise; balance and gait activities as tolerated; further assessment of dizziness as indicated    Consulted and Agree with Plan of Care Patient      Patient will benefit from skilled therapeutic intervention in order to improve the following deficits and impairments:  Postural dysfunction, Increased fascial restricitons, Decreased mobility, Decreased range of motion, Decreased strength, Abnormal gait, Decreased activity tolerance  Visit Diagnosis: Muscle weakness (generalized) - Plan: PT plan of care cert/re-cert  Unsteadiness on feet - Plan: PT plan of care cert/re-cert  Other abnormalities of gait and mobility - Plan: PT plan of care cert/re-cert      [facility] PT PB G-CODES - 01/23/2017 1144    Functional Assessment Tool Used  Evaluation; Berg Balance Scale; clinical assessment    Functional Limitations Mobility: Walking and moving around   Mobility: Walking and  Moving Around Goal Status [phone]) At least 60 percent but less than 80 percent impaired, limited or restricted   Mobility: Walking and Moving Around Discharge Status [phone]) At least 40 percent but less than 60 percent impaired, limited or restricted       Problem List Patient Active Problem List   Diagnosis Date Noted  . Chronic kidney disease, stage 3 11/27/2015  . Elevated hemoglobin A1c 11/27/2015  . GERD (gastroesophageal reflux disease) 07/13/2015  . Osteoporosis 05/02/2015  . Long term (current) use of anticoagulants [Z79.01] 04/04/2015  . PAD (peripheral artery disease) ([place]) 04/04/2015  . Renal artery stenosis ([place]) 04/04/2015  . Right-sided carotid artery disease ([place]) 04/04/2015  . Vitamin D deficiency 03/09/2015  . Atrial fibrillation ([place]) 03/03/2015  . Hemorrhoid 03/03/2015  . Depression 03/03/2015  . Hyperlipidemia 03/03/2015  . Essential hypertension, benign 03/03/2015  . Urinary incontinence 03/03/2015  . Health care maintenance 03/03/2015    Russel Morain Nilda Simmer PT, MPH  01/23/2017, 11:48 AM  [facility] [place], [place], [address] Phone: [phone]   Fax:  [phone]  Name: Kathy Hobbs MRN: [id number] Date of Birth: 1940/08/11

## 2017-01-03 ENCOUNTER — Ambulatory Visit (INDEPENDENT_AMBULATORY_CARE_PROVIDER_SITE_OTHER): Payer: Medicare Other | Admitting: Pharmacist Clinician (PhC)/ Clinical Pharmacy Specialist

## 2017-01-03 DIAGNOSIS — Z7901 Long term (current) use of anticoagulants: Secondary | ICD-10-CM | POA: Diagnosis not present

## 2017-01-03 DIAGNOSIS — I4891 Unspecified atrial fibrillation: Secondary | ICD-10-CM

## 2017-01-03 DIAGNOSIS — I480 Paroxysmal atrial fibrillation: Secondary | ICD-10-CM

## 2017-01-03 DIAGNOSIS — I701 Atherosclerosis of renal artery: Secondary | ICD-10-CM

## 2017-01-03 LAB — POCT INR: INR: 3.5

## 2017-01-06 ENCOUNTER — Telehealth: Payer: Self-pay | Admitting: Internal Medicine

## 2017-01-06 MED ORDER — IRBESARTAN 300 MG PO TABS: 300.0000 mg | ORAL_TABLET | Freq: Every day | ORAL | 1 refills | Status: DC

## 2017-01-06 NOTE — Telephone Encounter (Signed)
New message     Pt c/o medication issue:  1. Name of Medication: valsartan   2. How are you currently taking this medication (dosage and times per day)? Once a day  3. Are you having a reaction (difficulty breathing--STAT)?  no 4. What is your medication issue?  Due to recall pharmacy will not fill prescription , needs something else called in   [place]

## 2017-01-06 NOTE — Telephone Encounter (Signed)
Patient called w/med change Valsartan 320mg  >> irbesartan 300mg  QD Patient aware, will check BP every 2-3 days for 2 weeks to ensure no significant change in BP readings

## 2017-01-10 ENCOUNTER — Encounter: Payer: Self-pay | Admitting: Physical Therapy

## 2017-01-10 ENCOUNTER — Ambulatory Visit (INDEPENDENT_AMBULATORY_CARE_PROVIDER_SITE_OTHER): Payer: Medicare Other | Admitting: Physical Therapy

## 2017-01-10 DIAGNOSIS — M6281 Muscle weakness (generalized): Secondary | ICD-10-CM

## 2017-01-10 DIAGNOSIS — R2689 Other abnormalities of gait and mobility: Secondary | ICD-10-CM | POA: Diagnosis not present

## 2017-01-10 DIAGNOSIS — R2681 Unsteadiness on feet: Secondary | ICD-10-CM

## 2017-01-10 NOTE — Therapy (Signed)
[place], [place], [address] Phone: [phone]   Fax:  [phone]  Physical Therapy Treatment  Patient Details  Name: Kathy Hobbs MRN: [id number] Date of Birth: 23-Oct-1940 Referring Provider: Dr Brand Males  Encounter Date: 01/10/2017      PT End of Session - 01/10/17 1523    Visit Number 2   Number of Visits 12   Date for PT Re-Evaluation 02/13/17   PT Start Time 7614   PT Stop Time 1609   PT Time Calculation (min) 45 min   Activity Tolerance Patient tolerated treatment well  pt fatigued after tx.       Past Medical History:  Diagnosis Date  . Atrial fibrillation ([employer])   . Breast cancer ([place])   . Depression   . Hyperlipidemia   . Hypertension   . Right humeral fracture 2016  . Urine incontinence   . Vertigo     Past Surgical History:  Procedure Laterality Date  . BREAST BIOPSY    . BREAST LUMPECTOMY    . FEMORAL ARTERY STENT     ? pt states "Stent in my leg"  . RENAL ARTERY STENT Bilateral     There were no vitals filed for this visit.      Subjective Assessment - 01/10/17 1524    Subjective Kathy Hobbs reports she is having a good day today, not much dizziness at all. She had been doing a lot of walking and stretching out her hamstring.  She has not practiced standing tall.    Patient Stated Goals gain strength; to be able to get up off the ground; walk; not fall    Currently in Pain? No/denies                         [facility] Adult PT Treatment/Exercise - 01/10/17 0001      Neuro Re-ed    Neuro Re-ed Details  bilat HHA for BWD walking, single HHA for side stepping VC for upright posture.      Knee/Hip Exercises: Aerobic   Nustep L4x5'     Knee/Hip Exercises: Standing   Hip Extension 10 reps;Stengthening;Both  5 sec holds bringing hips off wall   Wall Squat 10 reps   Other Standing Knee Exercises pregait focusing on hip ext with step backs, then  standing tall with scapular retraction  scap retraction, red band, elbows bent and striaght    Other Standing Knee Exercises 15 reps sit to/from stand no UE assist - very hard for her to control the sit, mini lunge from high stool x 5 with min A                      PT Long Term Goals - 01/10/17 1544      PT LONG TERM GOAL #1   Title Improve standing posture and alignment with patient to stand with good weight bearing and balance 02/13/17   Status On-going     PT LONG TERM GOAL #2   Title Increase strength bilat LE's to 4+/5 to 5/5 throughout 02/13/17   Status On-going     PT LONG TERM GOAL #3   Title Improve gait safety and transfers at home to decrease fall risk and improve functional activity level 02/13/17   Status On-going     PT LONG TERM GOAL #4   Title Improve Berg Balance Scale score indicatiing decreased fall risk 02/13/17   Status  On-going     PT LONG TERM GOAL #5   Title Independent in HEP 02/13/17   Status On-going               Plan - 01/10/17 1612    Clinical Impression Statement Kathy Hobbs was eager to attempt all exercise asked of her today.  She was not having any problems with dizziness today.  She did fatigue and require some HHA for higher level standing activities. No goals met, only her second visit.    Clinical Impairments Affecting Rehab Potential declining health/activity level; sedentary lifestyle; fall risk    PT Frequency 2x / week   PT Duration 6 weeks   PT Treatment/Interventions Patient/family education;ADLs/Self Care Home Management;Cryotherapy;Electrical Stimulation;Moist Heat;Ultrasound;Dry needling;Manual techniques;Therapeutic activities;Therapeutic exercise;Balance training;Neuromuscular re-education;Gait training   PT Next Visit Plan progress HEP   Consulted and Agree with Plan of Care Patient      Patient will benefit from skilled therapeutic intervention in order to improve the following deficits and impairments:  Postural  dysfunction, Increased fascial restricitons, Decreased mobility, Decreased range of motion, Decreased strength, Abnormal gait, Decreased activity tolerance  Visit Diagnosis: Muscle weakness (generalized)  Unsteadiness on feet  Other abnormalities of gait and mobility     Problem List Patient Active Problem List   Diagnosis Date Noted  . Chronic kidney disease, stage 3 11/27/2015  . Elevated hemoglobin A1c 11/27/2015  . GERD (gastroesophageal reflux disease) 07/13/2015  . Osteoporosis 05/02/2015  . Long term (current) use of anticoagulants [Z79.01] 04/04/2015  . PAD (peripheral artery disease) ([place]) 04/04/2015  . Renal artery stenosis ([place]) 04/04/2015  . Right-sided carotid artery disease ([place]) 04/04/2015  . Vitamin D deficiency 03/09/2015  . Atrial fibrillation ([place]) 03/03/2015  . Hemorrhoid 03/03/2015  . Depression 03/03/2015  . Hyperlipidemia 03/03/2015  . Essential hypertension, benign 03/03/2015  . Urinary incontinence 03/03/2015  . Health care maintenance 03/03/2015    Jeral Pinch PT  01/10/2017, 4:18 PM  [facility] [place], [place], [address] Phone: [phone]   Fax:  [phone]  Name: Kathy Hobbs MRN: [id number] Date of Birth: [birth year]

## 2017-01-13 ENCOUNTER — Encounter: Payer: Self-pay | Admitting: Rehabilitative and Restorative Service Providers"

## 2017-01-13 ENCOUNTER — Ambulatory Visit (INDEPENDENT_AMBULATORY_CARE_PROVIDER_SITE_OTHER): Payer: Medicare Other | Admitting: Rehabilitative and Restorative Service Providers"

## 2017-01-13 DIAGNOSIS — R2689 Other abnormalities of gait and mobility: Secondary | ICD-10-CM | POA: Diagnosis not present

## 2017-01-13 DIAGNOSIS — R2681 Unsteadiness on feet: Secondary | ICD-10-CM | POA: Diagnosis not present

## 2017-01-13 DIAGNOSIS — M6281 Muscle weakness (generalized): Secondary | ICD-10-CM

## 2017-01-13 NOTE — Patient Instructions (Addendum)
SIT TO STAND: No Device    Sit with feet shoulder-width apart, on floor. Lean chest forward, raise hips up from surface. Straighten hips and knees. Weight bear equally on left and right sides. _10__ reps per set, _2-3__ sets per day  Strengthening: Hip Abduction - Resisted    With tubing around right leg, other side toward anchor, extend leg out from side. Repeat _10___ times per set. Do _2-3___ sets per session. Do __1-2__ sessions per day.   Strengthening: Hip Extension - Resisted    With tubing around right ankle, face anchor and pull leg straight back. Repeat _10___ times per set. Do _2-3___ sets per session. Do __1__ sessions per day.  Knee [facility] a chair for balance, slowly bend knees. Keep both feet on the floor. Repeat _10___ times. 2-3 sets  Do __1-2__ sessions per day.   Heel Raise (Calf Strength / Balance)    Stand with support Rise up on tiptoes Hold position to count of _5__. Return slowly Repeat _10__ times per session. Do__1-2_ sessions per day.   Balance: Unilateral    Attempt to balance on left leg, eyes open. Hold _20-30___ seconds. Repeat __5__ times per set. Do __1-2__ sessions per day. Perform exercise with eyes closed.

## 2017-01-13 NOTE — Therapy (Signed)
[place], [place], [address] Phone: [phone]   Fax:  [phone]  Physical Therapy Treatment  Patient Details  Name: Kathy Hobbs MRN: [id number] Date of Birth: 12-03-1940 Referring Provider: Dr Brand Males  Encounter Date: 01/13/2017      PT End of Session - 01/13/17 1358    Visit Number 3   Number of Visits 12   Date for PT Re-Evaluation 02/13/17   PT Start Time 6834   PT Stop Time 1433   PT Time Calculation (min) 35 min   Activity Tolerance Patient tolerated treatment well      Past Medical History:  Diagnosis Date  . Atrial fibrillation ([place])   . Breast cancer ([place])   . Depression   . Hyperlipidemia   . Hypertension   . Right humeral fracture 2016  . Urine incontinence   . Vertigo     Past Surgical History:  Procedure Laterality Date  . BREAST BIOPSY    . BREAST LUMPECTOMY    . FEMORAL ARTERY STENT     ? pt states "Stent in my leg"  . RENAL ARTERY STENT Bilateral     There were no vitals filed for this visit.      Subjective Assessment - 01/13/17 1359    Subjective Patient reports that she did well with her last weeks exercise. She is working on some of her exercises at home. Feels she is doing well.    Currently in Pain? No/denies                         [facility] Adult PT Treatment/Exercise - 01/13/17 0001      Knee/Hip Exercises: Aerobic   Nustep L5x5'     Knee/Hip Exercises: Standing   Hip Abduction Stengthening;Right;Left;15 reps;Knee straight  leading with heel; standing at rail for UE support    Hip Extension Stengthening;Right;Left;15 reps;Knee straight  standing at railing for UE support    Lateral Step Up Right;Left;20 reps;Hand Hold: 2   Forward Step Up Right;Left;20 reps;Hand Hold: 2   Functional Squat 15 reps   SLS 30 sec x 3 reps each LE - +2 Hand hold assist      Knee/Hip Exercises: Seated   Sit to Sand 15 reps;without UE  support  focus on slow controlled stand to sit                 PT Education - 01/13/17 1420    Education provided Yes   Education Details HEP    Person(s) Educated Patient   Methods Explanation;Demonstration;Tactile cues;Verbal cues;Handout   Comprehension Verbalized understanding;Returned demonstration;Verbal cues required;Tactile cues required             PT Long Term Goals - 01/13/17 1358      PT LONG TERM GOAL #1   Title Improve standing posture and alignment with patient to stand with good weight bearing and balance 02/13/17   Time 6   Period Weeks   Status On-going     PT LONG TERM GOAL #2   Title Increase strength bilat LE's to 4+/5 to 5/5 throughout 02/13/17   Time 6   Period Weeks   Status On-going     PT LONG TERM GOAL #3   Title Improve gait safety and transfers at home to decrease fall risk and improve functional activity level 02/13/17   Time 6   Period Weeks   Status On-going  PT LONG TERM GOAL #4   Title Improve Berg Balance Scale score indicatiing decreased fall risk 02/13/17   Time 6   Period Weeks   Status On-going     PT LONG TERM GOAL #5   Title Independent in HEP 02/13/17   Time 6   Period Weeks   Status On-going               Plan - 01/13/17 1404    Clinical Impression Statement Continued work well with PT. She requries periods of rest during the exercises. Gradually progressing toward goals of therapy.     Rehab Potential Good   PT Frequency 2x / week   PT Duration 6 weeks   PT Treatment/Interventions Patient/family education;ADLs/Self Care Home Management;Cryotherapy;Electrical Stimulation;Moist Heat;Ultrasound;Dry needling;Manual techniques;Therapeutic activities;Therapeutic exercise;Balance training;Neuromuscular re-education;Gait training   PT Next Visit Plan continue stretching and strengthening; progress HEP    Consulted and Agree with Plan of Care Patient      Patient will benefit from skilled therapeutic  intervention in order to improve the following deficits and impairments:  Postural dysfunction, Increased fascial restricitons, Decreased mobility, Decreased range of motion, Decreased strength, Abnormal gait, Decreased activity tolerance  Visit Diagnosis: Muscle weakness (generalized)  Unsteadiness on feet  Other abnormalities of gait and mobility     Problem List Patient Active Problem List   Diagnosis Date Noted  . Chronic kidney disease, stage 3 11/27/2015  . Elevated hemoglobin A1c 11/27/2015  . GERD (gastroesophageal reflux disease) 07/13/2015  . Osteoporosis 05/02/2015  . Long term (current) use of anticoagulants [Z79.01] 04/04/2015  . PAD (peripheral artery disease) ([place]) 04/04/2015  . Renal artery stenosis ([place]) 04/04/2015  . Right-sided carotid artery disease ([place]) 04/04/2015  . Vitamin D deficiency 03/09/2015  . Atrial fibrillation ([place]) 03/03/2015  . Hemorrhoid 03/03/2015  . Depression 03/03/2015  . Hyperlipidemia 03/03/2015  . Essential hypertension, benign 03/03/2015  . Urinary incontinence 03/03/2015  . Health care maintenance 03/03/2015    [place] Nilda Simmer PT, MPH  01/13/2017, 2:28 PM  [facility] [place], [place], [address] Phone: [phone]   Fax:  [phone]  Name: Kathy Hobbs MRN: [id number] Date of Birth: Oct 23, 1940

## 2017-01-15 ENCOUNTER — Encounter: Payer: Self-pay | Admitting: Rehabilitative and Restorative Service Providers"

## 2017-01-15 ENCOUNTER — Ambulatory Visit (INDEPENDENT_AMBULATORY_CARE_PROVIDER_SITE_OTHER): Payer: Medicare Other | Admitting: Rehabilitative and Restorative Service Providers"

## 2017-01-15 DIAGNOSIS — R2689 Other abnormalities of gait and mobility: Secondary | ICD-10-CM

## 2017-01-15 DIAGNOSIS — M6281 Muscle weakness (generalized): Secondary | ICD-10-CM

## 2017-01-15 DIAGNOSIS — R2681 Unsteadiness on feet: Secondary | ICD-10-CM | POA: Diagnosis not present

## 2017-01-15 NOTE — Patient Instructions (Addendum)
Bridging    Slowly raise buttocks from floor, keeping stomach tight. Repeat __10__ times per set. Do _2-3___ sets per session. Do __1__ sessions per day.   Hip External Rotation With Pillow: Transverse Plane Stability   Both  knees bent,  Slowly roll bent knee out. Be sure pelvis does not rotate. Do _10__ times. Repeat with other leg. Do _1-2__ sets, _1__ times per day.  SIT TO STAND: No Device    Sit with feet shoulder-width apart, on floor. Lean chest forward, raise hips up from surface. Straighten hips and knees. Weight bear equally on left and right sides. _10__ reps per set, _1-2__ sets per day

## 2017-01-15 NOTE — Therapy (Signed)
[place], [place], [address] Phone: [phone]   Fax:  [phone]  Physical Therapy Treatment  Patient Details  Name: Kathy Hobbs MRN: [id number] Date of Birth: February 11, 1941 Referring Provider: Dr Brand Males  Encounter Date: 01/15/2017      PT End of Session - 01/15/17 1525    Visit Number 4   Number of Visits 12   Date for PT Re-Evaluation 02/13/17   PT Start Time 1518   PT Stop Time 1603   PT Time Calculation (min) 45 min   Activity Tolerance Patient tolerated treatment well      Past Medical History:  Diagnosis Date  . Atrial fibrillation ([place])   . Breast cancer ([place])   . Depression   . Hyperlipidemia   . Hypertension   . Right humeral fracture 2016  . Urine incontinence   . Vertigo     Past Surgical History:  Procedure Laterality Date  . BREAST BIOPSY    . BREAST LUMPECTOMY    . FEMORAL ARTERY STENT     ? pt states "Stent in my leg"  . RENAL ARTERY STENT Bilateral     There were no vitals filed for this visit.      Subjective Assessment - 01/15/17 1528    Subjective Drove herself here today for the first time and did fine. Working on her exercises at home a little. Working on sitting without plopping.    Currently in Pain? No/denies                         [facility] Adult PT Treatment/Exercise - 01/15/17 0001      Knee/Hip Exercises: Stretches   Passive Hamstring Stretch 3 reps;30 seconds  supine with strap      Knee/Hip Exercises: Aerobic   Nustep L5x5'     Knee/Hip Exercises: Standing   Hip Abduction Stengthening;Right;Left;15 reps;Knee straight  leading with heel; standing at rail for UE support    Hip Extension Stengthening;Right;Left;15 reps;Knee straight  standing at railing for UE support    Lateral Step Up Right;Left;20 reps;Hand Hold: 2   Forward Step Up Right;Left;20 reps;Hand Hold: 2   Functional Squat 15 reps   SLS 20-30 sec x  3 reps each LE - +2 Hand hold assist    Other Standing Knee Exercises side steps 12 ft x 8 reps; tandum walking 12 ft x 8 +1 to +2 HHA as needed on counter      Knee/Hip Exercises: Seated   Abd/Adduction Limitations adduction green TB x 15    Sit to Sand 15 reps;without UE support  focus on slow controlled stand to sit      Knee/Hip Exercises: Supine   Hip Adduction Isometric Strengthening;Both;10 reps  ball squeeze    Bridges with Ball Squeeze Strengthening;Both;10 reps   Bridges with Clamshell Strengthening;Both;10 reps;2 sets  green TB    Other Supine Knee/Hip Exercises clam holding one LE still moving opposite green TB x 10 each LE                 PT Education - 01/15/17 1548    Education provided Yes   Education Details HEP   Person(s) Educated Patient   Methods Explanation;Demonstration;Tactile cues;Verbal cues;Handout   Comprehension Verbalized understanding;Returned demonstration;Verbal cues required;Tactile cues required             PT Long Term Goals - 01/13/17 1358      PT LONG  TERM GOAL #1   Title Improve standing posture and alignment with patient to stand with good weight bearing and balance 02/13/17   Time 6   Period Weeks   Status On-going     PT LONG TERM GOAL #2   Title Increase strength bilat LE's to 4+/5 to 5/5 throughout 02/13/17   Time 6   Period Weeks   Status On-going     PT LONG TERM GOAL #3   Title Improve gait safety and transfers at home to decrease fall risk and improve functional activity level 02/13/17   Time 6   Period Weeks   Status On-going     PT LONG TERM GOAL #4   Title Improve Berg Balance Scale score indicatiing decreased fall risk 02/13/17   Time 6   Period Weeks   Status On-going     PT LONG TERM GOAL #5   Title Independent in HEP 02/13/17   Time 6   Period Weeks   Status On-going               Plan - 01/15/17 1525    Clinical Impression Statement Working well with ther exercise. Contintues to  require periods of time for rest but tolerates exercise well. Exercise is progressing and patient is tolerating incresed exercise in therapy.    Rehab Potential Good   Clinical Impairments Affecting Rehab Potential declining health/activity level; sedentary lifestyle; fall risk    PT Frequency 2x / week   PT Duration 6 weeks   PT Treatment/Interventions Patient/family education;ADLs/Self Care Home Management;Cryotherapy;Electrical Stimulation;Moist Heat;Ultrasound;Dry needling;Manual techniques;Therapeutic activities;Therapeutic exercise;Balance training;Neuromuscular re-education;Gait training   PT Next Visit Plan continue stretching and strengthening; progress HEP    Consulted and Agree with Plan of Care Patient      Patient will benefit from skilled therapeutic intervention in order to improve the following deficits and impairments:  Postural dysfunction, Increased fascial restricitons, Decreased mobility, Decreased range of motion, Decreased strength, Abnormal gait, Decreased activity tolerance  Visit Diagnosis: Muscle weakness (generalized)  Unsteadiness on feet  Other abnormalities of gait and mobility     Problem List Patient Active Problem List   Diagnosis Date Noted  . Chronic kidney disease, stage 3 11/27/2015  . Elevated hemoglobin A1c 11/27/2015  . GERD (gastroesophageal reflux disease) 07/13/2015  . Osteoporosis 05/02/2015  . Long term (current) use of anticoagulants [Z79.01] 04/04/2015  . PAD (peripheral artery disease) ([place]) 04/04/2015  . Renal artery stenosis ([place]) 04/04/2015  . Right-sided carotid artery disease ([place]) 04/04/2015  . Vitamin D deficiency 03/09/2015  . Atrial fibrillation ([place]) 03/03/2015  . Hemorrhoid 03/03/2015  . Depression 03/03/2015  . Hyperlipidemia 03/03/2015  . Essential hypertension, benign 03/03/2015  . Urinary incontinence 03/03/2015  . Health care maintenance 03/03/2015    Celyn Nilda Simmer PT, MPH  01/15/2017, 4:13 PM  [facility] [place], [place], [address] Phone: [phone]   Fax:  [phone]  Name: Kathy Hobbs MRN: [id number] Date of Birth: 07-30-1940

## 2017-01-20 ENCOUNTER — Encounter: Payer: Self-pay | Admitting: Physical Therapy

## 2017-01-22 ENCOUNTER — Ambulatory Visit (INDEPENDENT_AMBULATORY_CARE_PROVIDER_SITE_OTHER): Payer: Medicare Other | Admitting: Physical Therapy

## 2017-01-22 DIAGNOSIS — R2689 Other abnormalities of gait and mobility: Secondary | ICD-10-CM | POA: Diagnosis not present

## 2017-01-22 DIAGNOSIS — R2681 Unsteadiness on feet: Secondary | ICD-10-CM | POA: Diagnosis not present

## 2017-01-22 DIAGNOSIS — M6281 Muscle weakness (generalized): Secondary | ICD-10-CM | POA: Diagnosis present

## 2017-01-22 NOTE — Patient Instructions (Signed)
Pt issued HEP from hep[employer]go.[employer].  Included seated hamstring stretch, seated quad stretch, standing tandem stance.

## 2017-01-22 NOTE — Therapy (Signed)
Kathy Hobbs [place], [place], [address] Phone: [phone]   Fax:  [phone]  Physical Therapy Treatment  Patient Details  Name: Kathy Hobbs MRN: [id number] Date of Birth: [birth year] Referring Provider: Dr.Renee Adelina Mings  Encounter Date: 01/22/2017      PT End of Session - 01/22/17 1415    Visit Number 5   Number of Visits 12   Date for PT Re-Evaluation 02/13/17   PT Start Time 7253   PT Stop Time 1433   PT Time Calculation (min) 30 min   Activity Tolerance Patient tolerated treatment well   Behavior During Therapy [facility] for tasks assessed/performed      Past Medical History:  Diagnosis Date  . Atrial fibrillation ([place])   . Breast cancer ([place])   . Depression   . Hyperlipidemia   . Hypertension   . Right humeral fracture 2016  . Urine incontinence   . Vertigo     Past Surgical History:  Procedure Laterality Date  . BREAST BIOPSY    . BREAST LUMPECTOMY    . FEMORAL ARTERY STENT     ? pt states "Stent in my leg"  . RENAL ARTERY STENT Bilateral     There were no vitals filed for this visit.      Subjective Assessment - 01/22/17 1406    Subjective Pt reports she tripped in shower Monday due to episode of vertigo, therefore she missed her appt.  She admits she is not fond of exercising.  She has only done the exercise here in therapy session.     Pertinent History A-fib; arthritis; LBP on an intermittent basis; vertigo; difficulty with memory     Patient Stated Goals gain strength; to be able to get up off the ground; walk; not fall    Currently in Pain? No/denies            [facility] PT Assessment - 01/22/17 0001      Assessment   Medical Diagnosis Weakness; gait disturbance   Referring Provider Dr.Renee Adelina Mings   Onset Date/Surgical Date 09/01/16   Hand Dominance Right   Next MD Visit 10/18   Prior Therapy yes 2016           [facility] Adult PT Treatment/Exercise - 01/22/17 0001       Knee/Hip Exercises: Stretches   Passive Hamstring Stretch Right;Left;2 reps;20 seconds  seated with back straight   Quad Stretch Right;Left;2 reps;20 seconds  foot under chair.    Gastroc Stretch Right;Left;2 reps;30 seconds     Knee/Hip Exercises: Aerobic   Nustep L4: 5 min      Knee/Hip Exercises: Standing   Other Standing Knee Exercises semi- tandem stance with occasional light UE on counter x 20-30 sec x 2 reps each side.     Knee/Hip Exercises: Seated   Sit to Sand 15 reps;without UE support  focus on slow controlled stand to sit      Demonstration provided on how to move from supine to sitting to high kneeling to standing. (in preparation for learning this technique in future visits).            PT Education - 01/22/17 1441    Education provided Yes   Education Details HEP    Person(s) Educated Patient   Methods Handout;Explanation   Comprehension Verbalized understanding;Returned demonstration             PT Long Term Goals - 01/22/17 1409      PT LONG TERM  GOAL #1   Title Improve standing posture and alignment with patient to stand with good weight bearing and balance 02/13/17   Time 6   Period Weeks   Status On-going     PT LONG TERM GOAL #2   Title Increase strength bilat LE's to 4+/5 to 5/5 throughout 02/13/17   Time 6   Period Weeks   Status On-going     PT LONG TERM GOAL #3   Title Improve gait safety and transfers at home to decrease fall risk and improve functional activity level 02/13/17   Time 6   Period Weeks   Status On-going     PT LONG TERM GOAL #4   Title Improve Berg Balance Scale score indicatiing decreased fall risk 02/13/17   Time 6   Period Weeks   Status On-going     PT LONG TERM GOAL #5   Title Independent in HEP 02/13/17   Time 6   Period Weeks   Status On-going               Plan - 01/22/17 1419    Clinical Impression Statement Pt tolerated exercises well without any loss of balance and with less rest  breaks.  Pt given encouragement to complete HEP as to progress towards goals. Pt interested in learning good ways to transition from floor( supine)  to standing; will begin working on this over course of therapy.     Rehab Potential Good   Clinical Impairments Affecting Rehab Potential declining health/activity level; sedentary lifestyle; fall risk    PT Frequency 2x / week   PT Duration 6 weeks   PT Treatment/Interventions Patient/family education;ADLs/Self Care Home Management;Cryotherapy;Electrical Stimulation;Moist Heat;Ultrasound;Dry needling;Manual techniques;Therapeutic activities;Therapeutic exercise;Balance training;Neuromuscular re-education;Gait training   PT Next Visit Plan continue stretching and strengthening; progress HEP       Patient will benefit from skilled therapeutic intervention in order to improve the following deficits and impairments:  Postural dysfunction, Increased fascial restricitons, Decreased mobility, Decreased range of motion, Decreased strength, Abnormal gait, Decreased activity tolerance  Visit Diagnosis: Muscle weakness (generalized)  Unsteadiness on feet  Other abnormalities of gait and mobility     Problem List Patient Active Problem List   Diagnosis Date Noted  . Chronic kidney disease, stage 3 11/27/2015  . Elevated hemoglobin A1c 11/27/2015  . GERD (gastroesophageal reflux disease) 07/13/2015  . Osteoporosis 05/02/2015  . Long term (current) use of anticoagulants [Z79.01] 04/04/2015  . PAD (peripheral artery disease) ([place]) 04/04/2015  . Renal artery stenosis ([place]) 04/04/2015  . Right-sided carotid artery disease ([place]) 04/04/2015  . Vitamin D deficiency 03/09/2015  . Atrial fibrillation ([place]) 03/03/2015  . Hemorrhoid 03/03/2015  . Depression 03/03/2015  . Hyperlipidemia 03/03/2015  . Essential hypertension, benign 03/03/2015  . Urinary incontinence 03/03/2015  . Health care maintenance 03/03/2015   [name], PTA 01/22/17  4:50 PM  [place], [place], [address] Phone: [phone]   Fax:  [phone]  Name: Kathy Hobbs MRN: [id number] Date of Birth: 1940/08/09

## 2017-01-23 ENCOUNTER — Encounter: Payer: Medicare Other | Admitting: Rehabilitative and Restorative Service Providers"

## 2017-01-24 ENCOUNTER — Encounter: Payer: Self-pay | Admitting: *Deleted

## 2017-01-24 ENCOUNTER — Ambulatory Visit (INDEPENDENT_AMBULATORY_CARE_PROVIDER_SITE_OTHER): Payer: Medicare Other | Admitting: Pharmacist Clinician (PhC)/ Clinical Pharmacy Specialist

## 2017-01-24 DIAGNOSIS — I701 Atherosclerosis of renal artery: Secondary | ICD-10-CM | POA: Diagnosis not present

## 2017-01-24 DIAGNOSIS — I4891 Unspecified atrial fibrillation: Secondary | ICD-10-CM | POA: Diagnosis not present

## 2017-01-24 DIAGNOSIS — I480 Paroxysmal atrial fibrillation: Secondary | ICD-10-CM | POA: Diagnosis not present

## 2017-01-24 DIAGNOSIS — Z7901 Long term (current) use of anticoagulants: Secondary | ICD-10-CM | POA: Diagnosis not present

## 2017-01-24 LAB — POCT INR: INR: 4.1

## 2017-01-29 ENCOUNTER — Ambulatory Visit (INDEPENDENT_AMBULATORY_CARE_PROVIDER_SITE_OTHER): Payer: Medicare Other | Admitting: Physical Therapy

## 2017-01-29 DIAGNOSIS — R2689 Other abnormalities of gait and mobility: Secondary | ICD-10-CM

## 2017-01-29 DIAGNOSIS — M6281 Muscle weakness (generalized): Secondary | ICD-10-CM | POA: Diagnosis present

## 2017-01-29 DIAGNOSIS — R2681 Unsteadiness on feet: Secondary | ICD-10-CM

## 2017-01-29 NOTE — Therapy (Addendum)
[place], [place], [address] Phone: [phone]   Fax:  [phone]  Physical Therapy Treatment  Patient Details  Name: Tresea Heine MRN: [id number] Date of Birth: 1941-05-27 Referring Provider: Dr. Brand Males  Encounter Date: 01/29/2017      PT End of Session - 01/29/17 1450    Visit Number 6   Number of Visits 12   Date for PT Re-Evaluation 02/13/17   PT Start Time 1694   PT Stop Time 1515   PT Time Calculation (min) 41 min   Activity Tolerance Patient tolerated treatment well   Behavior During Therapy [facility] for tasks assessed/performed      Past Medical History:  Diagnosis Date  . Atrial fibrillation ([place])   . Breast cancer ([place])   . Depression   . Hyperlipidemia   . Hypertension   . Right humeral fracture 2016  . Urine incontinence   . Vertigo     Past Surgical History:  Procedure Laterality Date  . BREAST BIOPSY    . BREAST LUMPECTOMY    . FEMORAL ARTERY STENT     ? pt states "Stent in my leg"  . RENAL ARTERY STENT Bilateral     There were no vitals filed for this visit.      Subjective Assessment - 01/29/17 1446    Subjective Pt reports she has not done any exercises since last visit. She can't seem to motivate herself to do the exercises.  "I'm just so tired all the time".  She has had no falls since last visit.  She states she feels like she has improved her strength a great deal since initiating therapy.    Pertinent History A-fib; arthritis; LBP on an intermittent basis; vertigo; difficulty with memory     Patient Stated Goals gain strength; to be able to get up off the ground; walk; not fall    Currently in Pain? No/denies            [facility] PT Assessment - 01/29/17 0001      Assessment   Medical Diagnosis Weakness; gait disturbance   Referring Provider Dr. Brand Males   Onset Date/Surgical Date 09/01/16   Hand Dominance Right   Next MD Visit 10/18      Strength   Right/Left Hip Right;Left   Right Hip Flexion 4+/5   Right Hip Extension 4+/5   Right Hip ABduction --  5-/5   Left Hip Flexion 5/5   Left Hip Extension 4/5   Left Hip ABduction 5/5          [facility] Adult PT Treatment/Exercise - 01/29/17 0001      Knee/Hip Exercises: Stretches   Passive Hamstring Stretch Right;Left;2 reps;20 seconds  seated with back straight   Quad Stretch Right;Left;2 reps;20 seconds  foot under chair.    Gastroc Stretch Right;Left;2 reps;30 seconds     Knee/Hip Exercises: Aerobic   Tread Mill 5 min @ 1.8-2.1 mph     Knee/Hip Exercises: Standing   SLS 20-30 sec x 3 reps each LE - +1-2 fingers for support, 3 reps    Other Standing Knee Exercises sitting up from sidelying position to quadruped x 1 reps.  High kneeling on 3" pad to standing x 1 rep with UE support on black table.    Other Standing Knee Exercises Toe taps to 13" step with light UE support x 5 reps - moved to 6" step x 10 reps  with only occasional UE support  x 3 sets; VC to focus on slow controlled movement.      Knee/Hip Exercises: Seated   Sit to Sand 1 set;5 reps;without UE support  to low mat. eccentric lowering                     PT Long Term Goals - 01/29/17 1449      PT LONG TERM GOAL #1   Title Improve standing posture and alignment with patient to stand with good weight bearing and balance 02/13/17   Time 6   Period Weeks   Status On-going     PT LONG TERM GOAL #2   Title Increase strength bilat LE's to 4+/5 to 5/5 throughout 02/13/17   Time 6   Period Weeks   Status Achieved     PT LONG TERM GOAL #3   Title Improve gait safety and transfers at home to decrease fall risk and improve functional activity level 02/13/17   Time 6   Period Weeks   Status On-going     PT LONG TERM GOAL #4   Title Improve Berg Balance Scale score indicating decreased fall risk 02/13/17   Time 6   Period Weeks   Status On-going     PT LONG TERM GOAL #5   Title  Independent in HEP 02/13/17   Time 6   Period Weeks   Status On-going               Plan - 01/29/17 1517    Clinical Impression Statement Pt demonstrated improved LE strength (despite verbalizing non-compliance with HEP) ; has met LTG#2.  She was able to stand on one leg for ~3 sec without support.  She was able to demonstrate ability to get up from supine position (when broken down into parts). She is making gains towards established goals.    Rehab Potential Good   Clinical Impairments Affecting Rehab Potential declining health/activity level; sedentary lifestyle; fall risk    PT Frequency 2x / week   PT Duration 6 weeks   PT Treatment/Interventions Patient/family education;ADLs/Self Care Home Management;Cryotherapy;Electrical Stimulation;Moist Heat;Ultrasound;Dry needling;Manual techniques;Therapeutic activities;Therapeutic exercise;Balance training;Neuromuscular re-education;Gait training   PT Next Visit Plan continue stretching and strengthening; progress HEP    Consulted and Agree with Plan of Care Patient      Patient will benefit from skilled therapeutic intervention in order to improve the following deficits and impairments:  Postural dysfunction, Increased fascial restricitons, Decreased mobility, Decreased range of motion, Decreased strength, Abnormal gait, Decreased activity tolerance  Visit Diagnosis: Muscle weakness (generalized)  Unsteadiness on feet  Other abnormalities of gait and mobility     Problem List Patient Active Problem List   Diagnosis Date Noted  . Chronic kidney disease, stage 3 11/27/2015  . Elevated hemoglobin A1c 11/27/2015  . GERD (gastroesophageal reflux disease) 07/13/2015  . Osteoporosis 05/02/2015  . Long term (current) use of anticoagulants [Z79.01] 04/04/2015  . PAD (peripheral artery disease) ([place]) 04/04/2015  . Renal artery stenosis ([place]) 04/04/2015  . Right-sided carotid artery disease ([place]) 04/04/2015  . Vitamin D deficiency  03/09/2015  . Atrial fibrillation ([place]) 03/03/2015  . Hemorrhoid 03/03/2015  . Depression 03/03/2015  . Hyperlipidemia 03/03/2015  . Essential hypertension, benign 03/03/2015  . Urinary incontinence 03/03/2015  . Health care maintenance 03/03/2015   [name], PTA 01/29/17 5:17 PM  [place], [place], [address] Phone: [phone]   Fax:  [phone]  Name: Mekala Winger MRN: [id number]  Date of Birth: Jan 22, 1941  PHYSICAL THERAPY DISCHARGE SUMMARY  Visits from Start of Care: 6  Current functional level related to goals / functional outcomes: See progress note for discharge status   Remaining deficits: See note   Education / Equipment: HEP  Plan: Patient agrees to discharge.  Patient goals were partially met. Patient is being discharged due to being pleased with the current functional level.  ?????    Celyn P. Helene Kelp PT, MPH 04/04/17 2:26 PM

## 2017-01-30 ENCOUNTER — Other Ambulatory Visit: Payer: Self-pay | Admitting: Family Medicine

## 2017-01-30 DIAGNOSIS — F339 Major depressive disorder, recurrent, unspecified: Secondary | ICD-10-CM

## 2017-01-31 ENCOUNTER — Other Ambulatory Visit: Payer: Self-pay | Admitting: *Deleted

## 2017-01-31 ENCOUNTER — Encounter: Payer: Medicare Other | Admitting: Physical Therapy

## 2017-01-31 DIAGNOSIS — I10 Essential (primary) hypertension: Secondary | ICD-10-CM

## 2017-01-31 NOTE — Telephone Encounter (Signed)
Left message on daughters voice mail asking for clarification on medication dosing.

## 2017-01-31 NOTE — Telephone Encounter (Signed)
Please call pt: - Has she been taking this medication daily? It seems like she would have needed refills before now.  - I do not want to restart a medicine she has not been taking without seeing her, since her BP was low last time we saw her. If her BP is still lower end, and she had not been taking this med then, then adding back now could cause her BP to be too low.  - if she had been taking it then and daily, then verify dose (1 tab vs 1/2 tab etc) and I will refill for her.

## 2017-01-31 NOTE — Telephone Encounter (Signed)
Refill request received for patients spironolactone this was last refilled on 02/06/16 for 90 tabs with 1 refill she was last seen for muscle weakness 12/16/16. Do you want to refill?

## 2017-01-31 NOTE — Telephone Encounter (Signed)
Left message on patient's husband voice mail to call back and clarify if patient is still on this medication .

## 2017-02-04 ENCOUNTER — Other Ambulatory Visit: Payer: Self-pay

## 2017-02-04 DIAGNOSIS — I10 Essential (primary) hypertension: Secondary | ICD-10-CM

## 2017-02-04 MED ORDER — SPIRONOLACTONE 25 MG PO TABS
25.0000 mg | ORAL_TABLET | Freq: Every day | ORAL | 0 refills | Status: DC
Start: 2017-02-04 — End: 2017-05-05

## 2017-02-04 NOTE — Telephone Encounter (Signed)
Patient's daughter brought in picture of bottle on cell phone. The picture shows a fill date of 10/31/16 for a 90 day refill. The pharmacy on the bottle is [facility]. She has been taking the medication daily 1 tab 25MG . She only has 1 pill left to take today. Please send in refill.

## 2017-02-04 NOTE — Telephone Encounter (Signed)
Per note, medication refilled. Patient called stating that she has been taking medication 1 tab a day. Medication sent to pharmacy.

## 2017-02-04 NOTE — Telephone Encounter (Signed)
Please advise. Thanks.  

## 2017-02-04 NOTE — Telephone Encounter (Signed)
Refill sent.

## 2017-02-19 ENCOUNTER — Ambulatory Visit (INDEPENDENT_AMBULATORY_CARE_PROVIDER_SITE_OTHER): Payer: Medicare Other | Admitting: Pharmacist Clinician (PhC)/ Clinical Pharmacy Specialist

## 2017-02-19 DIAGNOSIS — Z7901 Long term (current) use of anticoagulants: Secondary | ICD-10-CM

## 2017-02-19 DIAGNOSIS — Z23 Encounter for immunization: Secondary | ICD-10-CM | POA: Diagnosis not present

## 2017-02-19 DIAGNOSIS — I480 Paroxysmal atrial fibrillation: Secondary | ICD-10-CM

## 2017-02-19 DIAGNOSIS — I4891 Unspecified atrial fibrillation: Secondary | ICD-10-CM

## 2017-02-19 LAB — POCT INR: INR: 3.2

## 2017-03-12 ENCOUNTER — Ambulatory Visit (INDEPENDENT_AMBULATORY_CARE_PROVIDER_SITE_OTHER): Payer: Medicare Other | Admitting: Pharmacist

## 2017-03-12 DIAGNOSIS — I4891 Unspecified atrial fibrillation: Secondary | ICD-10-CM

## 2017-03-12 DIAGNOSIS — I480 Paroxysmal atrial fibrillation: Secondary | ICD-10-CM

## 2017-03-12 DIAGNOSIS — Z7901 Long term (current) use of anticoagulants: Secondary | ICD-10-CM | POA: Diagnosis not present

## 2017-03-12 LAB — POCT INR: INR: 3

## 2017-04-09 ENCOUNTER — Ambulatory Visit (INDEPENDENT_AMBULATORY_CARE_PROVIDER_SITE_OTHER): Payer: Medicare Other | Admitting: Pharmacist

## 2017-04-09 DIAGNOSIS — I480 Paroxysmal atrial fibrillation: Secondary | ICD-10-CM | POA: Diagnosis not present

## 2017-04-09 DIAGNOSIS — Z7901 Long term (current) use of anticoagulants: Secondary | ICD-10-CM

## 2017-04-09 LAB — POCT INR: INR: 2.3

## 2017-05-05 ENCOUNTER — Other Ambulatory Visit: Payer: Self-pay

## 2017-05-05 DIAGNOSIS — I10 Essential (primary) hypertension: Secondary | ICD-10-CM

## 2017-05-05 MED ORDER — SPIRONOLACTONE 25 MG PO TABS
25.0000 mg | ORAL_TABLET | Freq: Every day | ORAL | 3 refills | Status: DC
Start: 2017-05-05 — End: 2017-05-07

## 2017-05-07 ENCOUNTER — Ambulatory Visit (INDEPENDENT_AMBULATORY_CARE_PROVIDER_SITE_OTHER): Payer: Medicare Other | Admitting: Pharmacist

## 2017-05-07 ENCOUNTER — Other Ambulatory Visit: Payer: Self-pay | Admitting: Internal Medicine

## 2017-05-07 DIAGNOSIS — Z7901 Long term (current) use of anticoagulants: Secondary | ICD-10-CM | POA: Diagnosis not present

## 2017-05-07 DIAGNOSIS — I10 Essential (primary) hypertension: Secondary | ICD-10-CM

## 2017-05-07 DIAGNOSIS — I480 Paroxysmal atrial fibrillation: Secondary | ICD-10-CM

## 2017-05-07 DIAGNOSIS — I4891 Unspecified atrial fibrillation: Secondary | ICD-10-CM

## 2017-05-07 LAB — POCT INR: INR: 2.5

## 2017-05-07 MED ORDER — SPIRONOLACTONE 25 MG PO TABS: 25.0000 mg | ORAL_TABLET | Freq: Every day | ORAL | 3 refills | Status: DC

## 2017-05-07 NOTE — Telephone Encounter (Signed)
°*  STAT* If patient is at the pharmacy, call can be transferred to refill team.   1. Which medications need to be refilled? (please list name of each medication and dose if known)prescription was sent to the wrong pharmacy-Spironolactone  2. Which pharmacy/location (including street and city if local pharmacy) is [facility] to [phone]  3. Do they need a 30 day or 90 day supply? 90 and refills

## 2017-05-07 NOTE — Telephone Encounter (Signed)
Rx sent as requested.

## 2017-05-12 ENCOUNTER — Telehealth: Payer: Self-pay | Admitting: Family Medicine

## 2017-05-12 DIAGNOSIS — F339 Major depressive disorder, recurrent, unspecified: Secondary | ICD-10-CM

## 2017-05-12 NOTE — Telephone Encounter (Signed)
Copied from [place]. Topic: Quick Communication - Rx Refill/Question >> May 12, 2017  8:45 AM Scherrie Gerlach wrote: Has the patient contacted their pharmacy? {yes , Buts wants sent to a different pharmacy than last refill  Pt request refill  sertraline (ZOLOFT) 50 MG tablet (Preferred Pharmacy (with phone number or street name):  [id number] [place] [place] [phone] (Phone) [phone] (Fax)

## 2017-05-13 ENCOUNTER — Telehealth: Payer: Self-pay | Admitting: Family Medicine

## 2017-05-13 NOTE — Telephone Encounter (Signed)
Copied from [place] [id number]. Topic: Quick Communication - See Telephone Encounter >> May 13, 2017  2:35 PM Arletha Grippe wrote: CRM for notification. See Telephone encounter for:  05/13/17.pt needs refill on sertraline (ZOLOFT) 50 MG tablet. [facility] [address] The pharmacy number is [phone]. Pt cb number is [phone]. Pt is asking for 90 day supply and says she is out of medication.

## 2017-05-14 MED ORDER — SERTRALINE HCL 50 MG PO TABS: 50.0000 mg | ORAL_TABLET | Freq: Every day | ORAL | 0 refills | Status: DC

## 2017-05-14 NOTE — Telephone Encounter (Signed)
Med [facility]

## 2017-05-14 NOTE — Telephone Encounter (Signed)
Office visit: 07/24/16 Last refill 01/30/17 90 tablets no refills

## 2017-05-22 ENCOUNTER — Other Ambulatory Visit: Payer: Self-pay | Admitting: Internal Medicine

## 2017-05-22 DIAGNOSIS — E785 Hyperlipidemia, unspecified: Secondary | ICD-10-CM

## 2017-06-13 ENCOUNTER — Ambulatory Visit (INDEPENDENT_AMBULATORY_CARE_PROVIDER_SITE_OTHER): Payer: Medicare Other | Admitting: Pharmacist Clinician (PhC)/ Clinical Pharmacy Specialist

## 2017-06-13 DIAGNOSIS — I480 Paroxysmal atrial fibrillation: Secondary | ICD-10-CM

## 2017-06-13 DIAGNOSIS — Z7901 Long term (current) use of anticoagulants: Secondary | ICD-10-CM | POA: Diagnosis not present

## 2017-06-13 DIAGNOSIS — I4891 Unspecified atrial fibrillation: Secondary | ICD-10-CM | POA: Diagnosis not present

## 2017-06-13 LAB — POCT INR: INR: 2.1

## 2017-06-19 ENCOUNTER — Telehealth: Payer: Self-pay | Admitting: Internal Medicine

## 2017-06-19 MED ORDER — WARFARIN SODIUM 2 MG PO TABS: ORAL_TABLET | ORAL | 0 refills | Status: DC

## 2017-06-19 NOTE — Telephone Encounter (Signed)
°  New Prob   *STAT* If patient is at the pharmacy, call can be transferred to refill team.   1. Which medications need to be refilled? (please list name of each medication and dose if known): warfarin (COUMADIN) 2 MG tablet  2. Which pharmacy/location (including street and city if local pharmacy) is [facility] to be sent to? [facility] [address] [facility] [place], [place]  3. Do they need a 30 day or 90 day supply? 90 days

## 2017-07-17 ENCOUNTER — Other Ambulatory Visit: Payer: Self-pay | Admitting: Internal Medicine

## 2017-07-18 ENCOUNTER — Ambulatory Visit (INDEPENDENT_AMBULATORY_CARE_PROVIDER_SITE_OTHER): Payer: Medicare Other | Admitting: Pharmacist

## 2017-07-18 DIAGNOSIS — Z7901 Long term (current) use of anticoagulants: Secondary | ICD-10-CM

## 2017-07-18 DIAGNOSIS — I4891 Unspecified atrial fibrillation: Secondary | ICD-10-CM

## 2017-07-18 DIAGNOSIS — I480 Paroxysmal atrial fibrillation: Secondary | ICD-10-CM | POA: Diagnosis not present

## 2017-07-18 LAB — POCT INR: INR: 2.2

## 2017-07-24 ENCOUNTER — Telehealth: Payer: Self-pay | Admitting: Family Medicine

## 2017-07-24 NOTE — Telephone Encounter (Signed)
Copied from [place] [phone]. Topic: Quick Communication - Rx Refill/Question >> Jul 24, 2017  2:31 PM Scherrie Gerlach wrote: Medication: sertraline (ZOLOFT) 50 MG tablet 90 days  Has the patient contacted their pharmacy? yes - [facility] to the [facility] However, advised pt this is not due for refill until 08/12/17. Pt got 90 tabs 05/14/17. Pt thought she was running low, but after checking pt confirmed. But would like refill available when time comes.  [id number] [place] [place] (Phone) [phone] (Fax

## 2017-08-04 ENCOUNTER — Other Ambulatory Visit: Payer: Self-pay | Admitting: *Deleted

## 2017-08-04 DIAGNOSIS — F339 Major depressive disorder, recurrent, unspecified: Secondary | ICD-10-CM

## 2017-08-04 MED ORDER — SERTRALINE HCL 50 MG PO TABS: 50.0000 mg | ORAL_TABLET | Freq: Every day | ORAL | 0 refills | Status: DC

## 2017-08-04 NOTE — Telephone Encounter (Signed)
zoloft refilled for 14 day supply patient needs office visit prior to anymore refills left message for patient.

## 2017-08-06 NOTE — Telephone Encounter (Signed)
Pt has scheduled an appointment.

## 2017-08-08 ENCOUNTER — Encounter: Payer: Self-pay | Admitting: Family Medicine

## 2017-08-08 ENCOUNTER — Ambulatory Visit (INDEPENDENT_AMBULATORY_CARE_PROVIDER_SITE_OTHER): Payer: Medicare Other | Admitting: Family Medicine

## 2017-08-08 VITALS — BP 124/76 | HR 73 | Temp 97.4°F | Ht 66.0 in | Wt 175.8 lb

## 2017-08-08 DIAGNOSIS — F329 Major depressive disorder, single episode, unspecified: Secondary | ICD-10-CM

## 2017-08-08 DIAGNOSIS — R7989 Other specified abnormal findings of blood chemistry: Secondary | ICD-10-CM | POA: Diagnosis not present

## 2017-08-08 DIAGNOSIS — N183 Chronic kidney disease, stage 3 unspecified: Secondary | ICD-10-CM

## 2017-08-08 DIAGNOSIS — I739 Peripheral vascular disease, unspecified: Secondary | ICD-10-CM | POA: Diagnosis not present

## 2017-08-08 DIAGNOSIS — F339 Major depressive disorder, recurrent, unspecified: Secondary | ICD-10-CM | POA: Diagnosis not present

## 2017-08-08 DIAGNOSIS — I4891 Unspecified atrial fibrillation: Secondary | ICD-10-CM

## 2017-08-08 DIAGNOSIS — R1310 Dysphagia, unspecified: Secondary | ICD-10-CM | POA: Diagnosis not present

## 2017-08-08 DIAGNOSIS — I701 Atherosclerosis of renal artery: Secondary | ICD-10-CM

## 2017-08-08 DIAGNOSIS — F32A Depression, unspecified: Secondary | ICD-10-CM

## 2017-08-08 DIAGNOSIS — I10 Essential (primary) hypertension: Secondary | ICD-10-CM | POA: Diagnosis not present

## 2017-08-08 LAB — TSH: TSH: 4.41 u[IU]/mL (ref 0.35–4.50)

## 2017-08-08 LAB — T4, FREE: FREE T4: 0.8 ng/dL (ref 0.60–1.60)

## 2017-08-08 MED ORDER — CITALOPRAM HYDROBROMIDE 20 MG PO TABS: 20.0000 mg | ORAL_TABLET | Freq: Every day | ORAL | 1 refills | Status: DC

## 2017-08-08 MED ORDER — SERTRALINE HCL 100 MG PO TABS: 100.0000 mg | ORAL_TABLET | Freq: Every day | ORAL | 1 refills | Status: DC

## 2017-08-08 NOTE — Progress Notes (Signed)
Kathy Hobbs , [birth year], 77 y.o., female MRN: [id number] Patient Care Team    Relationship Specialty Notifications Start End  Ma Hillock, DO PCP - General Family Medicine  02/28/15     Chief Complaint  Patient presents with  . Medication Review    Pt is here to review her med Irbesartan     Subjective:  Depression:  Pt presents today for f/u depression. She reports compliance with celexa and zoloft daily, although it does not appear she had enough refills to last her this long. She reports she feels more depressed than usual. She has lost desire to really go out of the home at all. She had abnormal TSH in December and did not follow up for retesting. She states she forgot about this.  She is taking her vit d and b12. She reports she likes to stay in the house and read. She does go to [employer] on occassions. She will also visit her daughter. She has become extremely sedentary. She no longer walks at night with her husband.   She also questions if her avapro was part of the recall. She has not contacted her pharmacy or her cardiologist which supplies the medication.  Dysphagia/cough: Pt reports she after taking one bite of food or fluid she has a sensation on the left side of her throat that feels like 'tingle" or "quick pain" and then she has to cough. She does not regurgitate food. She states the cough that follows is immediately after the first bite or drink. She denies heart burn symptoms. She denies choking sensation. She reports her husband gets upset with the cough.    Depression screen [facility] 2/9 08/08/2017 07/24/2016 07/13/2015  Decreased Interest 2 0 0  Down, Depressed, Hopeless 2 0 0  PHQ - 2 Score 4 0 0  Altered sleeping 2 - -  Tired, decreased energy 3 - -  Change in appetite 2 - -  Feeling bad or failure about yourself  2 - -  Trouble concentrating 1 - -  Moving slowly or fidgety/restless 2 - -  Suicidal thoughts 2 - -  PHQ-9 Score 18 - -   GAD 7 :  Generalized Anxiety Score 08/08/2017  Nervous, Anxious, on Edge 2  Control/stop worrying 2  Worry too much - different things 2  Trouble relaxing 2  Restless 1  Easily annoyed or irritable 2  Afraid - awful might happen 2  Total GAD 7 Score 13    No Known Allergies Social History   Tobacco Use  . Smoking status: Never Smoker  . Smokeless tobacco: Never Used  Substance Use Topics  . Alcohol use: No    Alcohol/week: 0.0 oz   Past Medical History:  Diagnosis Date  . Atrial fibrillation ([place])   . Breast cancer ([place])   . Depression   . Hyperlipidemia   . Hypertension   . Right humeral fracture 2016  . Urine incontinence   . Vertigo    Past Surgical History:  Procedure Laterality Date  . BREAST BIOPSY    . BREAST LUMPECTOMY    . FEMORAL ARTERY STENT     ? pt states "Stent in my leg"  . RENAL ARTERY STENT Bilateral    Family History  Problem Relation Age of Onset  . Heart attack Father   . Depression Mother   . Alcohol abuse Brother   . Breast cancer Paternal Grandmother   . Alcoholism Brother   . Depression Brother  Allergies as of 08/08/2017   No Known Allergies     Medication List        Accurate as of 08/08/17  1:04 PM. Always use your most recent med list.          atorvastatin 80 MG tablet Commonly known as:  LIPITOR TAKE 1 TABLET BY MOUTH EVERY DAY   cholecalciferol 1000 units tablet Commonly known as:  VITAMIN D Take 1,000 Units by mouth daily.   citalopram 20 MG tablet Commonly known as:  CELEXA Take 1 tablet (20 mg total) by mouth daily.   CLARITIN 10 MG Caps Generic drug:  Loratadine Take by mouth.   irbesartan 300 MG tablet Commonly known as:  AVAPRO Take 1 tablet (300 mg total) by mouth daily.   sertraline 50 MG tablet Commonly known as:  ZOLOFT Take 1 tablet (50 mg total) by mouth daily. Patient needs office visit prior to anymore refills.   spironolactone 25 MG tablet Commonly known as:  ALDACTONE Take 1 tablet (25 mg total)  by mouth daily.   warfarin 2 MG tablet Commonly known as:  COUMADIN Take as directed by the anticoagulation clinic. If you are unsure how to take this medication, talk to your nurse or doctor. Original instructions:  TAKE 1/2 TO 1 TABLET DAILY AS DIRECTED BY COUMADIN CLINIC       All past medical history, surgical history, allergies, family history, immunizations andmedications were updated in the EMR today and reviewed under the history and medication portions of their EMR.     ROS: Negative, with the exception of above mentioned in HPI   Objective:  BP 124/76 (BP Location: Left Arm, Patient Position: Sitting, Cuff Size: Normal)   Pulse 73   Temp (!) 97.4 F (36.3 C) (Oral)   Ht 5\' 6"  (1.676 m)   Wt 175 lb 12.8 oz (79.7 kg)   SpO2 100%   BMI 28.37 kg/m  Body mass index is 28.37 kg/m. Gen: Afebrile. No acute distress. Nontoxic in appearance, well developed, well nourished.  HENT: AT. [place].MMM, no oral lesions.  Eyes:Pupils Equal Round Reactive to light, Extraocular movements intact,  Conjunctiva without redness, discharge or icterus. Neck/lymp/endocrine: Supple,no lymphadenopathy, no thyromegaly CV: RRR  Chest: CTAB, no wheeze or crackles.  Neuro:  Normal gait. PERLA. EOMi. Alert. Oriented x3  Psych: Normal affect, dress and demeanor. Normal speech. Normal thought content and judgment.  No exam data present No results found. No results found for this or any previous visit (from the past 24 hour(s)).  Assessment/Plan: Kathy Hobbs is a 77 y.o. female present for OV for  Essential hypertension, benign Atrial fibrillation, unspecified type ([place]) PAD (peripheral artery disease) ([place]) Renal artery stenosis ([place]) CKD 3 Advised her to contact her pharmacy first concerning medication. If part of recall then she needs to contact her cardiologist that  Supplies the medication.  Managed by cardiology.  Depression, unspecified depression type/elevated TSH - repeat thyroid  panel today.Certainly could be contributing to her symptoms.  - called pharmacy and verified medications have been taken continuously without interruption. Therefore, will refill celexa at 20 mg QD.  Increase zoloft to 100 mg QD.  - TSH - T4, free F/u 6 months as long as doing well, sooner if needed. Must bee seen prior to refills.   Globus sensation/cough:  - pt agreeable to GI referral. Seems to be possible dysphagia with eating solid or liquid after first bite, therefore unlikely GERD cause.    Reviewed expectations re: course of  current medical issues.  Discussed self-management of symptoms.  Outlined signs and symptoms indicating need for more acute intervention.  Patient verbalized understanding and all questions were answered.  Patient received an After-Visit Summary.    No orders of the defined types were placed in this encounter.    Note is dictated utilizing voice recognition software. Although note has been proof read prior to signing, occasional typographical errors still can be missed. If any questions arise, please do not hesitate to call for verification.   electronically signed by:  Howard Pouch, DO  [place]

## 2017-08-08 NOTE — Patient Instructions (Signed)
We will call you later today after we talk to your pharmacy.  Please see if you have been taking the celexa I circled on your list.   I will refer you to Gastroenterology and they will call you concerning your throat symptoms    We will call you concerning your labs once results received.

## 2017-08-13 ENCOUNTER — Telehealth: Payer: Self-pay | Admitting: Family Medicine

## 2017-08-13 ENCOUNTER — Other Ambulatory Visit: Payer: Self-pay | Admitting: Internal Medicine

## 2017-08-13 NOTE — Telephone Encounter (Signed)
Copied from [place] [id number]. Topic: Quick Communication - Other Results >> Aug 13, 2017 10:32 AM Cecelia Byars, NT wrote: Patient would like a call to discus her lab results please call her at [phone]

## 2017-08-13 NOTE — Telephone Encounter (Signed)
Spoke with patient reviewed lab results. 

## 2017-08-18 ENCOUNTER — Other Ambulatory Visit: Payer: Self-pay | Admitting: Internal Medicine

## 2017-08-18 DIAGNOSIS — E785 Hyperlipidemia, unspecified: Secondary | ICD-10-CM

## 2017-08-29 ENCOUNTER — Ambulatory Visit (INDEPENDENT_AMBULATORY_CARE_PROVIDER_SITE_OTHER): Payer: Medicare Other | Admitting: Pharmacist

## 2017-08-29 DIAGNOSIS — Z7901 Long term (current) use of anticoagulants: Secondary | ICD-10-CM | POA: Diagnosis not present

## 2017-08-29 DIAGNOSIS — I480 Paroxysmal atrial fibrillation: Secondary | ICD-10-CM | POA: Diagnosis not present

## 2017-08-29 DIAGNOSIS — I4891 Unspecified atrial fibrillation: Secondary | ICD-10-CM

## 2017-08-29 LAB — POCT INR: INR: 1.9

## 2017-09-16 ENCOUNTER — Other Ambulatory Visit: Payer: Self-pay

## 2017-09-18 ENCOUNTER — Telehealth: Payer: Self-pay | Admitting: Internal Medicine

## 2017-09-18 NOTE — Telephone Encounter (Signed)
Spoke with patient - she was unsure of what pharmacy wanted to change and why.  Explained that both drugs have been part of the recent recalls, but this was more of a cost savings for the patient.  Explained to her that if we need to we can switch, but we find that irbesartan works much better than losartan, and if we switch, she might need another medication to continue BP control.  Patient is fine paying the little extra for irbesartan, as her pressure is well controlled.    Called pharmacy to inform them to continue with irbesartan.

## 2017-09-18 NOTE — Telephone Encounter (Signed)
Received fax notification from [facility] that patient is requesting new Rx. She is paying $26 for irbesartan 300mg  but can get losartan 100mg  for $6 and is requesting this lower cost alternative.   Will route to MD/[facility] to advise on med change request

## 2017-09-23 ENCOUNTER — Other Ambulatory Visit: Payer: Self-pay | Admitting: Internal Medicine

## 2017-09-23 NOTE — Telephone Encounter (Signed)
REFILL 

## 2017-10-09 ENCOUNTER — Ambulatory Visit (INDEPENDENT_AMBULATORY_CARE_PROVIDER_SITE_OTHER): Payer: Medicare Other | Admitting: Pharmacist

## 2017-10-09 DIAGNOSIS — Z7901 Long term (current) use of anticoagulants: Secondary | ICD-10-CM | POA: Diagnosis not present

## 2017-10-09 DIAGNOSIS — I480 Paroxysmal atrial fibrillation: Secondary | ICD-10-CM | POA: Diagnosis not present

## 2017-10-09 DIAGNOSIS — I4891 Unspecified atrial fibrillation: Secondary | ICD-10-CM

## 2017-10-09 LAB — POCT INR: INR: 2.4

## 2017-10-13 DIAGNOSIS — M25551 Pain in right hip: Secondary | ICD-10-CM | POA: Diagnosis not present

## 2017-10-13 DIAGNOSIS — M25552 Pain in left hip: Secondary | ICD-10-CM | POA: Diagnosis not present

## 2017-10-30 DIAGNOSIS — R6889 Other general symptoms and signs: Secondary | ICD-10-CM | POA: Diagnosis not present

## 2017-10-30 DIAGNOSIS — M25552 Pain in left hip: Secondary | ICD-10-CM | POA: Diagnosis not present

## 2017-11-04 DIAGNOSIS — R6889 Other general symptoms and signs: Secondary | ICD-10-CM | POA: Diagnosis not present

## 2017-11-04 DIAGNOSIS — M25552 Pain in left hip: Secondary | ICD-10-CM | POA: Diagnosis not present

## 2017-11-09 ENCOUNTER — Other Ambulatory Visit: Payer: Self-pay | Admitting: Internal Medicine

## 2017-11-09 DIAGNOSIS — E785 Hyperlipidemia, unspecified: Secondary | ICD-10-CM

## 2017-11-13 ENCOUNTER — Ambulatory Visit (INDEPENDENT_AMBULATORY_CARE_PROVIDER_SITE_OTHER): Payer: Medicare Other | Admitting: Pharmacist

## 2017-11-13 DIAGNOSIS — Z7901 Long term (current) use of anticoagulants: Secondary | ICD-10-CM

## 2017-11-13 DIAGNOSIS — I480 Paroxysmal atrial fibrillation: Secondary | ICD-10-CM | POA: Diagnosis not present

## 2017-11-13 LAB — POCT INR: INR: 2.4 (ref 2.0–3.0)

## 2017-11-14 ENCOUNTER — Telehealth: Payer: Self-pay | Admitting: Pharmacist

## 2017-11-14 NOTE — Telephone Encounter (Signed)
New Message   Patient is calling because she was advised to provide the type of medication that she is taking and the dosage. She is taking meloxicam 15mg .

## 2017-11-18 NOTE — Telephone Encounter (Signed)
Thank you. Medication list updated.

## 2017-11-19 DIAGNOSIS — R6889 Other general symptoms and signs: Secondary | ICD-10-CM | POA: Diagnosis not present

## 2017-11-19 DIAGNOSIS — M25552 Pain in left hip: Secondary | ICD-10-CM | POA: Diagnosis not present

## 2017-11-24 DIAGNOSIS — M25552 Pain in left hip: Secondary | ICD-10-CM | POA: Diagnosis not present

## 2017-12-20 ENCOUNTER — Other Ambulatory Visit: Payer: Self-pay | Admitting: Internal Medicine

## 2017-12-25 ENCOUNTER — Ambulatory Visit (INDEPENDENT_AMBULATORY_CARE_PROVIDER_SITE_OTHER): Payer: Medicare Other | Admitting: Pharmacist

## 2017-12-25 ENCOUNTER — Encounter: Payer: Self-pay | Admitting: Cardiology

## 2017-12-25 ENCOUNTER — Ambulatory Visit (INDEPENDENT_AMBULATORY_CARE_PROVIDER_SITE_OTHER): Payer: Medicare Other | Admitting: Cardiology

## 2017-12-25 DIAGNOSIS — I4819 Other persistent atrial fibrillation: Secondary | ICD-10-CM

## 2017-12-25 DIAGNOSIS — I10 Essential (primary) hypertension: Secondary | ICD-10-CM | POA: Diagnosis not present

## 2017-12-25 DIAGNOSIS — I739 Peripheral vascular disease, unspecified: Secondary | ICD-10-CM | POA: Diagnosis not present

## 2017-12-25 DIAGNOSIS — N183 Chronic kidney disease, stage 3 unspecified: Secondary | ICD-10-CM

## 2017-12-25 DIAGNOSIS — I480 Paroxysmal atrial fibrillation: Secondary | ICD-10-CM

## 2017-12-25 DIAGNOSIS — I701 Atherosclerosis of renal artery: Secondary | ICD-10-CM

## 2017-12-25 DIAGNOSIS — E782 Mixed hyperlipidemia: Secondary | ICD-10-CM

## 2017-12-25 DIAGNOSIS — R5383 Other fatigue: Secondary | ICD-10-CM

## 2017-12-25 DIAGNOSIS — E785 Hyperlipidemia, unspecified: Secondary | ICD-10-CM | POA: Diagnosis not present

## 2017-12-25 DIAGNOSIS — I481 Persistent atrial fibrillation: Secondary | ICD-10-CM

## 2017-12-25 DIAGNOSIS — Z7901 Long term (current) use of anticoagulants: Secondary | ICD-10-CM | POA: Diagnosis not present

## 2017-12-25 LAB — POCT INR: INR: 2 (ref 2.0–3.0)

## 2017-12-25 MED ORDER — SPIRONOLACTONE 25 MG PO TABS: 25.0000 mg | ORAL_TABLET | Freq: Every day | ORAL | 3 refills | Status: DC

## 2017-12-25 MED ORDER — ATORVASTATIN CALCIUM 80 MG PO TABS: ORAL_TABLET | ORAL | 3 refills | Status: DC

## 2017-12-25 MED ORDER — IRBESARTAN 300 MG PO TABS: 300.0000 mg | ORAL_TABLET | Freq: Every day | ORAL | 3 refills | Status: DC

## 2017-12-25 NOTE — Assessment & Plan Note (Signed)
On statin Rx- followed by PCP 

## 2017-12-25 NOTE — Patient Instructions (Signed)
Medication Instructions: Your physician recommends that you continue on your current medications as directed. Please refer to the Current Medication list given to you today.   Labwork: TODAY: CBC BMP  Procedures/Testing: None Ordered  Follow-Up: Your physician wants you to follow-up in: 1 year with Dr.Hilty  You will receive a reminder letter in the mail two months in advance. If you don't receive a letter, please call our office to schedule the follow-up appointment.   Any Additional Special Instructions Will Be Listed Below (If Applicable).     If you need a refill on your cardiac medications before your next appointment, please call your pharmacy.

## 2017-12-25 NOTE — Assessment & Plan Note (Signed)
Controlled, normal LVF by [facility]

## 2017-12-25 NOTE — Assessment & Plan Note (Signed)
Prior LEA peripheral stents in [place]-ABI's 1.3-1.2 in 2017

## 2017-12-25 NOTE — Assessment & Plan Note (Signed)
This appears to be CAF. She had falls in 2018 and Coumadin was held but has since been resumed.  CHADS VASC=5

## 2017-12-25 NOTE — Progress Notes (Signed)
12/25/2017 Kathy Hobbs   05/19/1941  [id number]  Primary Physician Ma Hillock, DO Primary Cardiologist: Dr Debara Pickett  HPI:  pleasant 77 year old female who is followed by Dr Debara Pickett. The patient has what appears to be asymptomatic permanent atrial fibrilation. She was placed on warfarin by her primary care provider in [place] prior to moving up to [place] to live with her daughter. We manage her INR and this has been stable. [name] also has a significant history of PAD. She has a 40% right carotid artery stenosis by doppler in 2016. She has no history of stroke or TIA.Kathy Hobbs She also has a history of PAD and LEA peripheral stents as well as bilateral renal artery PCI by Dr. Sullivan Lone in [facility]. She had dopplers in 2017 that showed patent renal arteries and normal ABI's. In the past there was some concern about her history of falling. "I trip a lot". Her last was fall was a month ago when she tripped and fell on her Rt knee. She had bruising but no serious injury. Her only complaint to me was generalized fatigue. She denies palpitations or DOE. She says her TSH has been checked and is normal.    Current Outpatient Medications  Medication Sig Dispense Refill  . atorvastatin (LIPITOR) 80 MG tablet TAKE 1 TABLET BY MOUTH EVERY DAY. 90 tablet 3  . cholecalciferol (VITAMIN D) 1000 units tablet Take 1,000 Units by mouth daily.    . citalopram (CELEXA) 20 MG tablet Take 1 tablet (20 mg total) by mouth daily. 90 tablet 1  . irbesartan (AVAPRO) 300 MG tablet Take 1 tablet (300 mg total) by mouth daily. NEED OV. 90 tablet 3  . sertraline (ZOLOFT) 100 MG tablet Take 1 tablet (100 mg total) by mouth daily. Patient needs office visit prior to anymore refills. 90 tablet 1  . spironolactone (ALDACTONE) 25 MG tablet Take 1 tablet (25 mg total) by mouth daily. 90 tablet 3  . warfarin (COUMADIN) 2 MG tablet TAKE 1/2 TO 1 TABLET DAILY AS DIRECTED BY COUMADIN CLINIC 90 tablet 0   No current  facility-administered medications for this visit.     No Known Allergies  Past Medical History:  Diagnosis Date  . Atrial fibrillation ([place])   . Breast cancer ([place])   . Depression   . Hyperlipidemia   . Hypertension   . Right humeral fracture 2016  . Urine incontinence   . Vertigo     Social History   Socioeconomic History  . Marital status: Married    Spouse name: Not on file  . Number of children: Not on file  . Years of education: Not on file  . Highest education level: Not on file  Occupational History  . Not on file  Social Needs  . Financial resource strain: Not on file  . Food insecurity:    Worry: Not on file    Inability: Not on file  . Transportation needs:    Medical: Not on file    Non-medical: Not on file  Tobacco Use  . Smoking status: Never Smoker  . Smokeless tobacco: Never Used  Substance and Sexual Activity  . Alcohol use: No    Alcohol/week: 0.0 oz  . Drug use: No  . Sexual activity: Never  Lifestyle  . Physical activity:    Days per week: Not on file    Minutes per session: Not on file  . Stress: Not on file  Relationships  . Social connections:  Talks on phone: Not on file    Gets together: Not on file    Attends religious service: Not on file    Active member of club or organization: Not on file    Attends meetings of clubs or organizations: Not on file    Relationship status: Not on file  . Intimate partner violence:    Fear of current or ex partner: Not on file    Emotionally abused: Not on file    Physically abused: Not on file    Forced sexual activity: Not on file  Other Topics Concern  . Not on file  Social History Narrative   Married. Currently lives with daughter in [place] from [place] secondary to declining health.    Wears seatbelt, wears bike helmet.    - does not exercise daily.    - takes vitamins.    - Smoke alarm in the home. Guns in the home, locked case.   - feels safe in her relationships.        Epworth Sleepiness Scale = 10 (04/04/2015)      Homemaker      Highest level of education:  High school     Family History  Problem Relation Age of Onset  . Heart attack Father   . Depression Mother   . Alcohol abuse Brother   . Breast cancer Paternal Grandmother   . Alcoholism Brother   . Depression Brother      Review of Systems: General: negative for chills, fever, night sweats or weight changes.  Cardiovascular: negative for chest pain, dyspnea on exertion, edema, orthopnea, palpitations, paroxysmal nocturnal dyspnea or shortness of breath Dermatological: negative for rash Respiratory: negative for cough or wheezing Urologic: negative for hematuria Abdominal: negative for nausea, vomiting, diarrhea, bright red blood per rectum, melena, or hematemesis Neurologic: negative for visual changes, syncope, or dizziness All other systems reviewed and are otherwise negative except as noted above.    Blood pressure 118/72, pulse 74, height 5\' 6"  (1.676 m), weight 173 lb (78.5 kg).  General appearance: alert, cooperative and no distress Neck: no JVD and Rt CA bruit Lungs: clear to auscultation bilaterally Heart: irregularly irregular rhythm Extremities: no edema Skin: Skin color, texture, turgor normal. No rashes or lesions Neurologic: Grossly normal  EKG AF with VR 74  ASSESSMENT AND PLAN:   Atrial fibrillation (HCC) This appears to be CAF. She had falls in 2018 and Coumadin was held but has since been resumed.  CHADS VASC=5  Long term (current) use of anticoagulants [Z79.01] Couamdin  Essential hypertension, benign Controlled, normal LVF by [facility]  Dyslipidemia On statin Rx followed by PCP  Renal artery stenosis (HCC) Status post bilateral stenting in [facility] [place]-patent by doppler 2017  PAD (peripheral artery disease) ([place]) Prior LEA peripheral stents in [place]-ABI's 1.3-1.2 in 2017  Chronic kidney disease, stage 3 GFR in the  50's  Fatigue Generalized fatigue, I don't think this is related to her AF.   PLAN  Check CBC (fatigue on an anticoagulant) and BMP (stage 3 CRI on ARB). If the above unremarkable f/u Dr Debara Pickett in a year. Copies of her labs need to go to her PCP. If she has more falls we may need to consider an alternative to chronic systemic anticoagulation. She tells me she has only had one fall this past year. I don't see any need for an echo- she has no symptoms of CHF, has no chest pain, and we have no plans for DCCV.   Lurena Joiner  Latorria Zeoli PA-C 12/25/2017 12:01 PM

## 2017-12-25 NOTE — Assessment & Plan Note (Signed)
Generalized fatigue, I don't think this is related to her AF.

## 2017-12-25 NOTE — Assessment & Plan Note (Signed)
Status post bilateral stenting in [facility] [place]-patent by doppler 2017

## 2017-12-25 NOTE — Assessment & Plan Note (Signed)
GFR in the 50's

## 2017-12-25 NOTE — Assessment & Plan Note (Signed)
Couamdin 

## 2017-12-26 ENCOUNTER — Other Ambulatory Visit: Payer: Self-pay

## 2017-12-26 DIAGNOSIS — I779 Disorder of arteries and arterioles, unspecified: Secondary | ICD-10-CM

## 2017-12-26 DIAGNOSIS — I10 Essential (primary) hypertension: Secondary | ICD-10-CM

## 2017-12-26 DIAGNOSIS — I739 Peripheral vascular disease, unspecified: Principal | ICD-10-CM

## 2017-12-26 LAB — BASIC METABOLIC PANEL
BUN/Creatinine Ratio: 16 (ref 12–28)
BUN: 20 mg/dL (ref 8–27)
CO2: 24 mmol/L (ref 20–29)
Calcium: 9.9 mg/dL (ref 8.7–10.3)
Chloride: 104 mmol/L (ref 96–106)
Creatinine, Ser: 1.25 mg/dL — ABNORMAL HIGH (ref 0.57–1.00)
GFR calc Af Amer: 48 mL/min/{1.73_m2} — ABNORMAL LOW (ref >59)
GFR calc non Af Amer: 42 mL/min/{1.73_m2} — ABNORMAL LOW (ref >59)
Glucose: 99 mg/dL (ref 65–99)
Potassium: 4.5 mmol/L (ref 3.5–5.2)
Sodium: 141 mmol/L (ref 134–144)

## 2017-12-26 LAB — CBC
Hematocrit: 40.1 % (ref 34.0–46.6)
Hemoglobin: 13.3 g/dL (ref 11.1–15.9)
MCH: 30.6 pg (ref 26.6–33.0)
MCHC: 33.2 g/dL (ref 31.5–35.7)
MCV: 92 fL (ref 79–97)
Platelets: 161 10*3/uL (ref 150–450)
RBC: 4.34 x10E6/uL (ref 3.77–5.28)
RDW: 14.5 % (ref 12.3–15.4)
WBC: 4.8 10*3/uL (ref 3.4–10.8)

## 2017-12-26 MED ORDER — SPIRONOLACTONE 25 MG PO TABS: 12.5000 mg | ORAL_TABLET | Freq: Every day | ORAL | 3 refills | Status: DC

## 2017-12-31 ENCOUNTER — Other Ambulatory Visit: Payer: Self-pay

## 2018-01-07 ENCOUNTER — Ambulatory Visit (HOSPITAL_COMMUNITY)
Admission: RE | Admit: 2018-01-07 | Discharge: 2018-01-07 | Disposition: A | Discharge status: Home / Self Care | Payer: Medicare Other | Source: Ambulatory Visit | Attending: Internal Medicine | Admitting: Internal Medicine

## 2018-01-07 DIAGNOSIS — I779 Disorder of arteries and arterioles, unspecified: Secondary | ICD-10-CM | POA: Diagnosis not present

## 2018-01-07 DIAGNOSIS — I739 Peripheral vascular disease, unspecified: Secondary | ICD-10-CM

## 2018-01-27 DIAGNOSIS — Z23 Encounter for immunization: Secondary | ICD-10-CM | POA: Diagnosis not present

## 2018-02-03 ENCOUNTER — Other Ambulatory Visit: Payer: Self-pay

## 2018-02-03 ENCOUNTER — Other Ambulatory Visit: Payer: Self-pay | Admitting: *Deleted

## 2018-02-03 DIAGNOSIS — F339 Major depressive disorder, recurrent, unspecified: Secondary | ICD-10-CM

## 2018-02-03 MED ORDER — SERTRALINE HCL 100 MG PO TABS: 100.0000 mg | ORAL_TABLET | Freq: Every day | ORAL | 0 refills | Status: DC

## 2018-02-03 MED ORDER — CITALOPRAM HYDROBROMIDE 20 MG PO TABS: 20.0000 mg | ORAL_TABLET | Freq: Every day | ORAL | 0 refills | Status: DC

## 2018-02-10 ENCOUNTER — Ambulatory Visit (INDEPENDENT_AMBULATORY_CARE_PROVIDER_SITE_OTHER): Payer: Medicare Other | Admitting: Family Medicine

## 2018-02-10 ENCOUNTER — Encounter: Payer: Self-pay | Admitting: Family Medicine

## 2018-02-10 VITALS — BP 131/80 | HR 70 | Temp 97.9°F | Resp 20 | Ht 66.0 in | Wt 174.0 lb

## 2018-02-10 DIAGNOSIS — R05 Cough: Secondary | ICD-10-CM | POA: Diagnosis not present

## 2018-02-10 DIAGNOSIS — L989 Disorder of the skin and subcutaneous tissue, unspecified: Secondary | ICD-10-CM

## 2018-02-10 DIAGNOSIS — L608 Other nail disorders: Secondary | ICD-10-CM

## 2018-02-10 DIAGNOSIS — R053 Chronic cough: Secondary | ICD-10-CM

## 2018-02-10 DIAGNOSIS — F339 Major depressive disorder, recurrent, unspecified: Secondary | ICD-10-CM

## 2018-02-10 DIAGNOSIS — R1310 Dysphagia, unspecified: Secondary | ICD-10-CM

## 2018-02-10 MED ORDER — SERTRALINE HCL 100 MG PO TABS: 100.0000 mg | ORAL_TABLET | Freq: Every day | ORAL | 1 refills | Status: DC

## 2018-02-10 MED ORDER — CITALOPRAM HYDROBROMIDE 20 MG PO TABS: 20.0000 mg | ORAL_TABLET | Freq: Every day | ORAL | 1 refills | Status: DC

## 2018-02-10 NOTE — Progress Notes (Signed)
Kathy Hobbs , [birth year], 77 y.o., female MRN: [id number] Patient Care Team    Relationship Specialty Notifications Start End  Ma Hillock, DO PCP - General Family Medicine  02/28/15   Kathy Casino, MD PCP - Cardiology Cardiology Admissions 12/25/17     Chief Complaint  Patient presents with  . Depression     Subjective: Kathy Hobbs is a 77 y.o. female present for follow up on her depression.  Depression: She reports compliance with Celexa 20 mg daily and Zoloft 100 mg daily.  She does need to today.  She feels like she is in a good state of mind.  skin lesions: She reports she has 2 skin lesions one on her nose and one on her right cheek she is wondering if she needs removed.  She has a history of skin cancer on her nose in the past requiring graph.  Thickened nails:  Complained of thickened nails in the past referred to podiatry and she never scheduled.  She reports now she is having even more difficulty reaching and clipping her toenails.  She is fearful of causing bleeding given she is on Coumadin.  Dysphagia:  Has had ongoing globus sensation and discomfort with swallowing food, followed by a cough for a couple of years.  Has been treated for GERD in the past.  She has been referred to GI in the past, but has not scheduled.  She states that she feels her symptoms with swallowing are slowly becoming worse and would like referral placed today. Prior note: Pt reports she after taking one bite of food or fluid she has a sensation on the left side of her throat that feels like 'tingle" or "quick pain" and then she has to cough. She does not regurgitate food. She states the cough that follows is immediately after the first bite or drink. She denies heart burn symptoms. She denies choking sensation. She reports her husband gets upset with the cough.   Prior note: Patient has been reports that approximately one month ago he had noticed that his wife was coughing more  frequently after attempting to swallow both food and liquid. She states she doesn't choke on the items, but feels a "tickle", mostly on the right side of her throat, after swallowing. They report that this occurs at least 3 times a day. She states when she does have to cough to clear the tickle sensation, and it is dry. She does admit to feeling like she has increased allergies/postnasal drip since moving to [place]. She also endorses coughing at Nighttime. She denies a sour or bad taste in her throat upon awakening. She denies any increase in heartburn   Depression screen [facility] 2/9 02/10/2018 08/08/2017 07/24/2016 07/13/2015  Decreased Interest 0 2 0 0  Down, Depressed, Hopeless 0 2 0 0  PHQ - 2 Score 0 4 0 0  Altered sleeping 0 2 - -  Tired, decreased energy 0 3 - -  Change in appetite 0 2 - -  Feeling bad or failure about yourself  0 2 - -  Trouble concentrating 0 1 - -  Moving slowly or fidgety/restless 0 2 - -  Suicidal thoughts 0 2 - -  PHQ-9 Score 0 18 - -  Difficult doing work/chores Not difficult at all - - -    No Known Allergies Social History   Tobacco Use  . Smoking status: Never Smoker  . Smokeless tobacco: Never Used  Substance Use Topics  .  Alcohol use: No    Alcohol/week: 0.0 standard drinks   Past Medical History:  Diagnosis Date  . Atrial fibrillation ([place])   . Breast cancer ([place])   . Depression   . Hyperlipidemia   . Hypertension   . Right humeral fracture 2016  . Urine incontinence   . Vertigo    Past Surgical History:  Procedure Laterality Date  . BREAST BIOPSY    . BREAST LUMPECTOMY    . FEMORAL ARTERY STENT     ? pt states "Stent in my leg"  . RENAL ARTERY STENT Bilateral    Family History  Problem Relation Age of Onset  . Heart attack Father   . Depression Mother   . Alcohol abuse Brother   . Breast cancer Paternal Grandmother   . Alcoholism Brother   . Depression Brother    Allergies as of 02/10/2018   No Known Allergies       Medication List        Accurate as of 02/10/18  2:12 PM. Always use your most recent med list.          atorvastatin 80 MG tablet Commonly known as:  LIPITOR TAKE 1 TABLET BY MOUTH EVERY DAY.   cholecalciferol 1000 units tablet Commonly known as:  VITAMIN D Take 1,000 Units by mouth daily.   citalopram 20 MG tablet Commonly known as:  CELEXA Take 1 tablet (20 mg total) by mouth daily. Needs follow up appointment for further refills.   irbesartan 300 MG tablet Commonly known as:  AVAPRO Take 1 tablet (300 mg total) by mouth daily. NEED OV.   sertraline 100 MG tablet Commonly known as:  ZOLOFT Take 1 tablet (100 mg total) by mouth daily. Patient needs office visit prior to anymore refills. Needs follow up appointment for further refills.   spironolactone 25 MG tablet Commonly known as:  ALDACTONE Take 0.5 tablets (12.5 mg total) by mouth daily.   warfarin 2 MG tablet Commonly known as:  COUMADIN Take as directed by the anticoagulation clinic. If you are unsure how to take this medication, talk to your nurse or doctor. Original instructions:  TAKE 1/2 TO 1 TABLET DAILY AS DIRECTED BY COUMADIN CLINIC       All past medical history, surgical history, allergies, family history, immunizations andmedications were updated in the EMR today and reviewed under the history and medication portions of their EMR.     ROS: Negative, with the exception of above mentioned in HPI   Objective:  BP 131/80 (BP Location: Left Arm, Patient Position: Sitting, Cuff Size: Large)   Pulse 70   Temp 97.9 F (36.6 C)   Resp 20   Ht 5\' 6"  (1.676 m)   Wt 174 lb (78.9 kg)   SpO2 97%   BMI 28.08 kg/m  Body mass index is 28.08 kg/m. Gen: Afebrile. No acute distress. Nontoxic in appearance, well developed, well nourished.  HENT: AT. [place].  MMM, no oral lesions.  Throat without erythema or exudates.  No cough.  No hoarseness. Eyes:Pupils Equal Round Reactive to light, Extraocular movements  intact,  Conjunctiva without redness, discharge or icterus. Neck/lymp/endocrine: Supple, no lymphadenopathy, no thyromegaly CV: RRR  Chest: CTAB, no wheeze or crackles.  Skin: x2 <1 cm red scaly slightly ulcerated appearing lesions of right cheekbone and nose. Neuro:  Normal gait. PERLA. EOMi. Alert. Oriented x3  Psych: Normal affect, dress and demeanor. Normal speech. Normal thought content and judgment.  No exam data present No results found.  No results found for this or any previous visit (from the past 24 hour(s)).  Assessment/Plan: Anacristina Steffek is a 77 y.o. female present for OV for  Episode of recurrent major depressive disorder, unspecified depression episode severity (HCC) Stable, refills provided on Celexa and Zoloft today. - citalopram (CELEXA) 20 MG tablet; Take 1 tablet (20 mg total) by mouth daily.  Dispense: 90 tablet; Refill: 1 - sertraline (ZOLOFT) 100 MG tablet; Take 1 tablet (100 mg total) by mouth daily.  Dispense: 90 tablet; Refill: 1 Follow-up every 6 months  Skin lesions New skin lesions x2.  Possibly consistent with either basal or squamous cell carcinoma.  Prior history of skin cancer of unknown type.  Referral to dermatology placed today. - Ambulatory referral to Dermatology  Dysphagia, unspecified type/chronic cough Present t at least 2-3 years.  Sounds like it is worsening.  Has tried PPI in the past and has not been compliant, does not feel it improves her symptoms much.  She is agreeable to GI referral today. - Ambulatory referral to Gastroenterology  Discoloration and thickening of nails both feet He is having difficulty clipping her toenails and she is on Coumadin.  She would like to see a podiatrist to care for her feet. - Ambulatory referral to Podiatry   Reviewed expectations re: course of current medical issues.  Discussed self-management of symptoms.  Outlined signs and symptoms indicating need for more acute intervention.  Patient  verbalized understanding and all questions were answered.  Patient received an After-Visit Summary.    No orders of the defined types were placed in this encounter. > 25 minutes spent with patient, >50% of time spent face to face counseling and coordinating care.      Note is dictated utilizing voice recognition software. Although note has been proof read prior to signing, occasional typographical errors still can be missed. If any questions arise, please do not hesitate to call for verification.   electronically signed by:  Howard Pouch, DO  [place]

## 2018-02-10 NOTE — Patient Instructions (Addendum)
I am glad you are doing well. I am so sorry to hear about your husband's injury.  I have referred you to dermatology for your skin lesions, in case it is skin cancer.  I have refilled your medications for you today.  Followup in 6 months, sooner if needed.   Please help [place] help you:  We are honored you have chosen [facility] for your Primary Care home. Below you will find basic instructions that you may need to access in the future. Please help [place] help you by reading the instructions, which cover many of the frequent questions we experience.   Prescription refills and request:  -In order to allow more efficient response time, please call your pharmacy for all refills. They will forward the request electronically to [place]. This allows for the quickest possible response. Request left on a nurse line can take longer to refill, since these are checked as time allows between office patients and other phone calls.  - refill request can take up to 3-5 working days to complete.  - If request is sent electronically and request is appropiate, it is usually completed in 1-2 business days.  - all patients will need to be seen routinely for all chronic medical conditions requiring prescription medications (see follow-up below). If you are overdue for follow up on your condition, you will be asked to make an appointment and we will call in enough medication to cover you until your appointment (up to 30 days).  - all controlled substances will require a face to face visit to request/refill.  - if you desire your prescriptions to go through a new pharmacy, and have an active script at original pharmacy, you will need to call your pharmacy and have scripts transferred to new pharmacy. This is completed between the pharmacy locations and not by your provider.    Results: If any images or labs were ordered, it can take up to 1 week to get results depending on the test ordered and the lab/facility running and  resulting the test. - Normal or stable results, which do not need further discussion, may be released to your mychart immediately with attached note to you. A call may not be generated for normal results. Please make certain to sign up for mychart. If you have questions on how to activate your mychart you can call the front office.  - If your results need further discussion, our office will attempt to contact you via phone, and if unable to reach you after 2 attempts, we will release your abnormal result to your mychart with instructions.  - All results will be automatically released in mychart after 1 week.  - Your provider will provide you with explanation and instruction on all relevant material in your results. Please keep in mind, results and labs may appear confusing or abnormal to the untrained eye, but it does not mean they are actually abnormal for you personally. If you have any questions about your results that are not covered, or you desire more detailed explanation than what was provided, you should make an appointment with your provider to do so.   Our office handles many outgoing and incoming calls daily. If we have not contacted you within 1 week about your results, please check your mychart to see if there is a message first and if not, then contact our office.  In helping with this matter, you help decrease call volume, and therefore allow [place] to be able to respond to  patients needs more efficiently.   Acute office visits (sick visit):  An acute visit is intended for a new problem and are scheduled in shorter time slots to allow schedule openings for patients with new problems. This is the appropriate visit to discuss a new problem. Problems will not be addressed by phone call or Echart message. Appointment is needed if requesting treatment. In order to provide you with excellent quality medical care with proper time for you to explain your problem, have an exam and receive treatment with  instructions, these appointments should be limited to one new problem per visit. If you experience a new problem, in which you desire to be addressed, please make an acute office visit, we save openings on the schedule to accommodate you. Please do not save your new problem for any other type of visit, let us take care of it properly and quickly for you.   Follow up visits:  Depending on your condition(s) your provider will need to see you routinely in order to provide you with quality care and prescribe medication(s). Most chronic conditions (Example: hypertension, Diabetes, depression/anxiety... etc), require visits a couple times a year. Your provider will instruct you on proper follow up for your personal medical conditions and history. Please make certain to make follow up appointments for your condition as instructed. Failing to do so could result in lapse in your medication treatment/refills. If you request a refill, and are overdue to be seen on a condition, we will always provide you with a 30 day script (once) to allow you time to schedule.    Medicare wellness (well visit): - we have a wonderful Nurse Maudie Mercury), that will meet with you and provide you will yearly medicare wellness visits. These visits should occur yearly (can not be scheduled less than 1 calendar year apart) and cover preventive health, immunizations, advance directives and screenings you are entitled to yearly through your medicare benefits. Do not miss out on your entitled benefits, this is when medicare will pay for these benefits to be ordered for you.  These are strongly encouraged by your provider and is the appropriate type of visit to make certain you are up to date with all preventive health benefits. If you have not had your medicare wellness exam in the last 12 months, please make certain to schedule one by calling the office and schedule your medicare wellness with Maudie Mercury as soon as possible.   Yearly physical (well visit):   - Adults are recommended to be seen yearly for physicals. Check with your insurance and date of your last physical, most insurances require one calendar year between physicals. Physicals include all preventive health topics, screenings, medical exam and labs that are appropriate for gender/age and history. You may have fasting labs needed at this visit. This is a well visit (not a sick visit), new problems should not be covered during this visit (see acute visit).  - Pediatric patients are seen more frequently when they are younger. Your provider will advise you on well child visit timing that is appropriate for your their age. - This is not a medicare wellness visit. Medicare wellness exams do not have an exam portion to the visit. Some medicare companies allow for a physical, some do not allow a yearly physical. If your medicare allows a yearly physical you can schedule the medicare wellness with our nurse Maudie Mercury and have your physical with your provider after, on the same day. Please check with insurance for your full benefits.  Late Policy/No Shows:  - all new patients should arrive 15-30 minutes earlier than appointment to allow [place] time  to  obtain all personal demographics,  insurance information and for you to complete office paperwork. - All established patients should arrive 10-15 minutes earlier than appointment time to update all information and be checked in .  - In our best efforts to run on time, if you are late for your appointment you will be asked to either reschedule or if able, we will work you back into the schedule. There will be a wait time to work you back in the schedule,  depending on availability.  - If you are unable to make it to your appointment as scheduled, please call 24 hours ahead of time to allow [place] to fill the time slot with someone else who needs to be seen. If you do not cancel your appointment ahead of time, you may be charged a no show fee.

## 2018-02-12 ENCOUNTER — Encounter: Payer: Self-pay | Admitting: Family Medicine

## 2018-02-13 ENCOUNTER — Encounter: Payer: Self-pay | Admitting: Gastroenterology

## 2018-02-20 ENCOUNTER — Ambulatory Visit (INDEPENDENT_AMBULATORY_CARE_PROVIDER_SITE_OTHER): Payer: Medicare Other | Admitting: Pharmacist Clinician (PhC)/ Clinical Pharmacy Specialist

## 2018-02-20 DIAGNOSIS — Z7901 Long term (current) use of anticoagulants: Secondary | ICD-10-CM | POA: Diagnosis not present

## 2018-02-20 DIAGNOSIS — I480 Paroxysmal atrial fibrillation: Secondary | ICD-10-CM

## 2018-02-20 DIAGNOSIS — I4819 Other persistent atrial fibrillation: Secondary | ICD-10-CM

## 2018-02-20 DIAGNOSIS — I481 Persistent atrial fibrillation: Secondary | ICD-10-CM | POA: Diagnosis not present

## 2018-02-20 LAB — POCT INR: INR: 1.9 — AB (ref 2.0–3.0)

## 2018-02-23 ENCOUNTER — Ambulatory Visit (INDEPENDENT_AMBULATORY_CARE_PROVIDER_SITE_OTHER): Payer: Medicare Other | Admitting: Podiatry

## 2018-02-23 ENCOUNTER — Encounter: Payer: Self-pay | Admitting: Podiatry

## 2018-02-23 VITALS — BP 138/81 | HR 70

## 2018-02-23 DIAGNOSIS — M205X9 Other deformities of toe(s) (acquired), unspecified foot: Secondary | ICD-10-CM

## 2018-02-23 DIAGNOSIS — M79675 Pain in left toe(s): Secondary | ICD-10-CM | POA: Diagnosis not present

## 2018-02-23 DIAGNOSIS — B351 Tinea unguium: Secondary | ICD-10-CM | POA: Diagnosis not present

## 2018-02-23 DIAGNOSIS — I701 Atherosclerosis of renal artery: Secondary | ICD-10-CM | POA: Diagnosis not present

## 2018-02-23 DIAGNOSIS — M79674 Pain in right toe(s): Secondary | ICD-10-CM | POA: Diagnosis not present

## 2018-02-23 DIAGNOSIS — D689 Coagulation defect, unspecified: Secondary | ICD-10-CM

## 2018-02-25 NOTE — Progress Notes (Signed)
Subjective:   Patient ID: Kathy Hobbs, female   DOB: 77 y.o.   MRN: [id number]   HPI 77 year old female presents the office today for concerns of thick, painful, elongated toenails that she cannot trim herself.  She also said her fifth toe is curling some causing occasional discomfort.  She is having no redness or drainage or any swelling to the toenail sites and she has no other concerns today.  No recent injury to her feet.  She is on Coumadin  Review of Systems  All other systems reviewed and are negative.  Past Medical History:  Diagnosis Date  . Atrial fibrillation ([place])   . Breast cancer ([place])   . Depression   . Hyperlipidemia   . Hypertension   . Right humeral fracture 2016  . Skin cancer of nose    Unknown type, patient reports needed graft  . Urine incontinence   . Vertigo     Past Surgical History:  Procedure Laterality Date  . BREAST BIOPSY    . BREAST LUMPECTOMY    . FEMORAL ARTERY STENT     ? pt states "Stent in my leg"  . RENAL ARTERY STENT Bilateral      Current Outpatient Medications:  .  atorvastatin (LIPITOR) 80 MG tablet, TAKE 1 TABLET BY MOUTH EVERY DAY., Disp: 90 tablet, Rfl: 3 .  cholecalciferol (VITAMIN D) 1000 units tablet, Take 1,000 Units by mouth daily., Disp: , Rfl:  .  citalopram (CELEXA) 20 MG tablet, Take 1 tablet (20 mg total) by mouth daily., Disp: 90 tablet, Rfl: 1 .  irbesartan (AVAPRO) 300 MG tablet, Take 1 tablet (300 mg total) by mouth daily. NEED OV., Disp: 90 tablet, Rfl: 3 .  sertraline (ZOLOFT) 100 MG tablet, Take 1 tablet (100 mg total) by mouth daily., Disp: 90 tablet, Rfl: 1 .  spironolactone (ALDACTONE) 25 MG tablet, Take 0.5 tablets (12.5 mg total) by mouth daily., Disp: 90 tablet, Rfl: 3 .  warfarin (COUMADIN) 2 MG tablet, TAKE 1/2 TO 1 TABLET DAILY AS DIRECTED BY COUMADIN CLINIC, Disp: 90 tablet, Rfl: 0  No Known Allergies      Objective:  Physical Exam  General: AAO x3, NAD  Dermatological: Nails are  hypertrophic, dystrophic, brittle, discolored, elongated 10. No surrounding redness or drainage. Tenderness nails 1-5 bilaterally. No open lesions or pre-ulcerative lesions are identified today.  Vascular: Dorsalis Pedis artery and Posterior Tibial artery pedal pulses are 2/4 bilateral with immedate capillary fill time.  There is no pain with calf compression, swelling, warmth, erythema.   Neruologic: Grossly intact via light touch bilateral.  Protective threshold with Semmes Wienstein monofilament intact to all pedal sites bilateral.   Musculoskeletal: Adductovarus present to bilateral fifth toes.  There is no tenderness palpation of the area today.  No other areas of tenderness other than the toenails.  Muscular strength 5/5 in all groups tested bilateral.  Gait: Unassisted, Nonantalgic.       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 -Discussed shoe modifications for the fifth toes.  Discussed this toe separator between the fourth and fifth toe. -Discussed daily foot inspection  Return in about 3 months (around 05/25/2018).  Trula Slade DPM

## 2018-03-10 ENCOUNTER — Telehealth: Payer: Self-pay | Admitting: Cardiology

## 2018-03-10 MED ORDER — IRBESARTAN 300 MG PO TABS: 300.0000 mg | ORAL_TABLET | Freq: Every day | ORAL | 3 refills | Status: DC

## 2018-03-10 NOTE — Telephone Encounter (Signed)
New Message    *STAT* If patient is at the pharmacy, call can be transferred to refill team.   1. Which medications need to be refilled? (please list name of each medication and dose if known) irbesartan (AVAPRO) 300 MG tablet  2. Which pharmacy/location (including street and city if local pharmacy) is medication to be sent to? [id number] - [place] [address]  3. Do they need a 30 day or 90 day supply? [place]

## 2018-03-17 ENCOUNTER — Encounter: Payer: Self-pay | Admitting: Family Medicine

## 2018-03-24 ENCOUNTER — Ambulatory Visit: Payer: Medicare Other | Admitting: Gastroenterology

## 2018-04-10 ENCOUNTER — Ambulatory Visit (INDEPENDENT_AMBULATORY_CARE_PROVIDER_SITE_OTHER): Payer: Medicare Other | Admitting: Pharmacist Clinician (PhC)/ Clinical Pharmacy Specialist

## 2018-04-10 DIAGNOSIS — Z7901 Long term (current) use of anticoagulants: Secondary | ICD-10-CM | POA: Diagnosis not present

## 2018-04-10 DIAGNOSIS — I480 Paroxysmal atrial fibrillation: Secondary | ICD-10-CM | POA: Diagnosis not present

## 2018-04-10 LAB — POCT INR: INR: 2.3 (ref 2.0–3.0)

## 2018-04-10 NOTE — Patient Instructions (Signed)
Description   Continue taking 1/2 tablet daily.  Repeat INR in 6 weeks, sometime the week of December 16.

## 2018-05-18 DIAGNOSIS — Z7901 Long term (current) use of anticoagulants: Secondary | ICD-10-CM | POA: Diagnosis not present

## 2018-05-18 LAB — POCT INR: INR: 1.4 — AB (ref 2.0–3.0)

## 2018-05-19 ENCOUNTER — Ambulatory Visit (INDEPENDENT_AMBULATORY_CARE_PROVIDER_SITE_OTHER): Payer: Medicare Other | Admitting: Cardiology

## 2018-05-19 DIAGNOSIS — I4891 Unspecified atrial fibrillation: Secondary | ICD-10-CM | POA: Diagnosis not present

## 2018-05-19 DIAGNOSIS — Z7901 Long term (current) use of anticoagulants: Secondary | ICD-10-CM

## 2018-05-25 ENCOUNTER — Ambulatory Visit: Payer: Medicare Other | Admitting: Podiatry

## 2018-06-11 DIAGNOSIS — Z7901 Long term (current) use of anticoagulants: Secondary | ICD-10-CM | POA: Diagnosis not present

## 2018-06-11 LAB — POCT INR: INR: 1.6 — AB (ref 2.0–3.0)

## 2018-06-12 LAB — PROTIME-INR
INR: 1.6 — ABNORMAL HIGH (ref 0.8–1.2)
Prothrombin Time: 16.5 s — ABNORMAL HIGH (ref 9.1–12.0)

## 2018-06-22 ENCOUNTER — Telehealth: Payer: Self-pay

## 2018-06-22 NOTE — Telephone Encounter (Signed)
Patient using [facility] in [place]. Phone number [phone]  Will call [facility] today to get results ASAP.

## 2018-06-22 NOTE — Telephone Encounter (Signed)
Pt called stating that they have had blood drawn at the lab in [facility] but they have not gotten the new dosage. I instructed the pt that we have not received anything and that when we do that it gets processed asap. Routed to [place] please advise because she would like a call back from a Pharmd

## 2018-06-23 ENCOUNTER — Ambulatory Visit (INDEPENDENT_AMBULATORY_CARE_PROVIDER_SITE_OTHER): Payer: Medicare Other

## 2018-06-23 DIAGNOSIS — I4891 Unspecified atrial fibrillation: Secondary | ICD-10-CM

## 2018-06-23 DIAGNOSIS — Z7901 Long term (current) use of anticoagulants: Secondary | ICD-10-CM

## 2018-06-25 DIAGNOSIS — Z7901 Long term (current) use of anticoagulants: Secondary | ICD-10-CM | POA: Diagnosis not present

## 2018-06-25 DIAGNOSIS — I480 Paroxysmal atrial fibrillation: Secondary | ICD-10-CM | POA: Diagnosis not present

## 2018-06-26 ENCOUNTER — Ambulatory Visit (INDEPENDENT_AMBULATORY_CARE_PROVIDER_SITE_OTHER): Payer: Medicare Other | Admitting: Pharmacist Clinician (PhC)/ Clinical Pharmacy Specialist

## 2018-06-26 DIAGNOSIS — Z7901 Long term (current) use of anticoagulants: Secondary | ICD-10-CM | POA: Diagnosis not present

## 2018-06-26 DIAGNOSIS — I4891 Unspecified atrial fibrillation: Secondary | ICD-10-CM

## 2018-06-26 LAB — PROTIME-INR
INR: 1.7 — AB (ref 0.8–1.2)
PROTHROMBIN TIME: 17.7 s — AB (ref 9.1–12.0)

## 2018-07-06 DIAGNOSIS — Z7901 Long term (current) use of anticoagulants: Secondary | ICD-10-CM | POA: Diagnosis not present

## 2018-07-07 ENCOUNTER — Ambulatory Visit (INDEPENDENT_AMBULATORY_CARE_PROVIDER_SITE_OTHER): Payer: Medicare Other | Admitting: Pharmacist

## 2018-07-07 DIAGNOSIS — I4891 Unspecified atrial fibrillation: Secondary | ICD-10-CM | POA: Diagnosis not present

## 2018-07-07 DIAGNOSIS — Z7901 Long term (current) use of anticoagulants: Secondary | ICD-10-CM | POA: Diagnosis not present

## 2018-07-07 LAB — PROTIME-INR: INR: 2 — AB (ref 0.9–1.1)

## 2018-08-03 DIAGNOSIS — Z7901 Long term (current) use of anticoagulants: Secondary | ICD-10-CM | POA: Diagnosis not present

## 2018-08-04 ENCOUNTER — Ambulatory Visit (INDEPENDENT_AMBULATORY_CARE_PROVIDER_SITE_OTHER): Payer: Medicare Other | Admitting: Pharmacist

## 2018-08-04 DIAGNOSIS — Z7901 Long term (current) use of anticoagulants: Secondary | ICD-10-CM | POA: Diagnosis not present

## 2018-08-04 DIAGNOSIS — I4891 Unspecified atrial fibrillation: Secondary | ICD-10-CM

## 2018-08-04 LAB — PROTIME-INR: INR: 2.2 — AB (ref 0.9–1.1)

## 2018-08-07 ENCOUNTER — Other Ambulatory Visit: Payer: Self-pay | Admitting: Family Medicine

## 2018-08-07 DIAGNOSIS — F339 Major depressive disorder, recurrent, unspecified: Secondary | ICD-10-CM

## 2018-08-07 MED ORDER — SERTRALINE HCL 100 MG PO TABS: 100.0000 mg | ORAL_TABLET | Freq: Every day | ORAL | 0 refills | Status: DC

## 2018-08-07 NOTE — Telephone Encounter (Signed)
Received request for zoloft. Pt is overdue for her 6 month follow up.  She can be extended a 30 d script if needed, but hse must schedule an appt. For further refills.

## 2018-08-07 NOTE — Telephone Encounter (Signed)
Reached out to patient and she is out of medication. 30 day supply sent, okay per PCP. Patient will call back to schedule a the end of this month when she is in town.

## 2018-08-18 ENCOUNTER — Other Ambulatory Visit: Payer: Self-pay | Admitting: Internal Medicine

## 2018-08-27 ENCOUNTER — Other Ambulatory Visit: Payer: Self-pay | Admitting: Family Medicine

## 2018-08-27 DIAGNOSIS — F339 Major depressive disorder, recurrent, unspecified: Secondary | ICD-10-CM

## 2018-08-28 ENCOUNTER — Encounter: Payer: Self-pay | Admitting: Family Medicine

## 2018-08-28 ENCOUNTER — Other Ambulatory Visit: Payer: Self-pay

## 2018-08-28 ENCOUNTER — Ambulatory Visit (INDEPENDENT_AMBULATORY_CARE_PROVIDER_SITE_OTHER): Payer: Medicare Other | Admitting: Family Medicine

## 2018-08-28 DIAGNOSIS — F339 Major depressive disorder, recurrent, unspecified: Secondary | ICD-10-CM

## 2018-08-28 DIAGNOSIS — F329 Major depressive disorder, single episode, unspecified: Secondary | ICD-10-CM | POA: Diagnosis not present

## 2018-08-28 DIAGNOSIS — F32A Depression, unspecified: Secondary | ICD-10-CM

## 2018-08-28 MED ORDER — SERTRALINE HCL 100 MG PO TABS: 100.0000 mg | ORAL_TABLET | Freq: Every day | ORAL | 1 refills | Status: DC

## 2018-08-28 MED ORDER — CITALOPRAM HYDROBROMIDE 20 MG PO TABS: 20.0000 mg | ORAL_TABLET | Freq: Every day | ORAL | 1 refills | Status: DC

## 2018-08-28 NOTE — Progress Notes (Signed)
Chief Complaint  Patient presents with  . Depression    Medication refills.      Virtual Visit via Telephone Note  I connected with Kathy Hobbs on 08/28/18 at 10:15 AM EDT by telephone and verified that I am speaking with the correct person using two identifiers.   I discussed the limitations, risks, security and privacy concerns of performing an evaluation and management service by telephone and the availability of in person appointments. I also discussed with the patient that there may be a patient responsible charge related to this service. The patient expressed understanding and agreed to proceed.   History of Present Illness: Patient is in need of her refills on her depression medications. She is prescribed celexa 20 mg QD and zoloft 100 mg QD. She and her husband are currently "stuck" in [place] at their other home. They were advised to stay and not fly back to [employer].[place] Kitchen She feels her medications are working well for her.   Observations/Objective: Gen: No acute distress.  Neuro: . Alert. Oriented.  Psych: Normal affect and demeanor. Normal speech. Normal thought content and judgment.  Depression screen [facility] 2/9 08/28/2018 02/10/2018 08/08/2017 07/24/2016 07/13/2015  Decreased Interest 0 0 2 0 0  Down, Depressed, Hopeless 0 0 2 0 0  PHQ - 2 Score 0 0 4 0 0  Altered sleeping 0 0 2 - -  Tired, decreased energy 0 0 3 - -  Change in appetite 0 0 2 - -  Feeling bad or failure about yourself  0 0 2 - -  Trouble concentrating 0 0 1 - -  Moving slowly or fidgety/restless 0 0 2 - -  Suicidal thoughts 0 0 2 - -  PHQ-9 Score 0 0 18 - -  Difficult doing work/chores Not difficult at all Not difficult at all - - -   GAD 7 : Generalized Anxiety Score 08/28/2018 08/08/2017  Nervous, Anxious, on Edge 0 2  Control/stop worrying 0 2  Worry too much - different things 0 2  Trouble relaxing 0 2  Restless 0 1  Easily annoyed or irritable 0 2  Afraid - awful might happen 0 2  Total GAD 7 Score 0 13   Anxiety Difficulty Not difficult at all -   Assessment and Plan: Depression, unspecified depression type - stable.  - doing well and staying safe during COVID19. -  Refills provided to today on zoloft 100 mg QD and celexa 20 mg Qd. - f/u 6 months.   Follow Up Instructions: 6 months    I discussed the assessment and treatment plan with the patient. The patient was provided an opportunity to ask questions and all were answered. The patient agreed with the plan and demonstrated an understanding of the instructions.   The patient was advised to call back or seek an in-person evaluation if the symptoms worsen or if the condition fails to improve as anticipated.  I provided 14 minutes of non-face-to-face time during this encounter.   Howard Pouch, DO

## 2018-11-09 ENCOUNTER — Other Ambulatory Visit: Payer: Self-pay | Admitting: Internal Medicine

## 2018-11-09 NOTE — Telephone Encounter (Signed)
Patient in [place]. She will be visiting local laboratory ASAP and send results to [place].

## 2018-11-11 DIAGNOSIS — L602 Onychogryphosis: Secondary | ICD-10-CM | POA: Diagnosis not present

## 2018-11-11 DIAGNOSIS — M79671 Pain in right foot: Secondary | ICD-10-CM | POA: Diagnosis not present

## 2018-11-11 DIAGNOSIS — E109 Type 1 diabetes mellitus without complications: Secondary | ICD-10-CM | POA: Diagnosis not present

## 2018-11-11 DIAGNOSIS — M79672 Pain in left foot: Secondary | ICD-10-CM | POA: Diagnosis not present

## 2018-12-03 ENCOUNTER — Other Ambulatory Visit: Payer: Self-pay | Admitting: Internal Medicine

## 2018-12-03 NOTE — Telephone Encounter (Signed)
Called pt and left message - not seen since March and overdue for visit.

## 2018-12-16 ENCOUNTER — Telehealth: Payer: Self-pay

## 2018-12-16 NOTE — Telephone Encounter (Signed)
lmomed for inr check

## 2018-12-17 ENCOUNTER — Other Ambulatory Visit: Payer: Self-pay | Admitting: Pharmacist Clinician (PhC)/ Clinical Pharmacy Specialist

## 2018-12-17 DIAGNOSIS — Z7901 Long term (current) use of anticoagulants: Secondary | ICD-10-CM

## 2018-12-17 DIAGNOSIS — I4891 Unspecified atrial fibrillation: Secondary | ICD-10-CM

## 2018-12-17 NOTE — Telephone Encounter (Signed)
Fax to [employer] [facility] in [place] [phone]

## 2018-12-17 NOTE — Telephone Encounter (Signed)
Needs standing order sent to [facility] [employer] [place] Fax [phone]

## 2018-12-21 DIAGNOSIS — Z7901 Long term (current) use of anticoagulants: Secondary | ICD-10-CM | POA: Diagnosis not present

## 2018-12-21 DIAGNOSIS — I4891 Unspecified atrial fibrillation: Secondary | ICD-10-CM | POA: Diagnosis not present

## 2018-12-21 LAB — PROTIME-INR: INR: 3.1 — AB (ref <=1.1)

## 2018-12-22 ENCOUNTER — Ambulatory Visit (INDEPENDENT_AMBULATORY_CARE_PROVIDER_SITE_OTHER): Payer: Medicare Other | Admitting: Pharmacist Clinician (PhC)/ Clinical Pharmacy Specialist

## 2018-12-22 DIAGNOSIS — I4891 Unspecified atrial fibrillation: Secondary | ICD-10-CM | POA: Diagnosis not present

## 2018-12-22 DIAGNOSIS — Z7901 Long term (current) use of anticoagulants: Secondary | ICD-10-CM

## 2018-12-25 ENCOUNTER — Other Ambulatory Visit: Payer: Self-pay | Admitting: Internal Medicine

## 2018-12-28 ENCOUNTER — Other Ambulatory Visit: Payer: Self-pay

## 2018-12-28 ENCOUNTER — Telehealth: Payer: Self-pay | Admitting: Internal Medicine

## 2018-12-28 ENCOUNTER — Telehealth: Payer: Self-pay

## 2018-12-28 ENCOUNTER — Telehealth: Payer: Self-pay | Admitting: Cardiology

## 2018-12-28 ENCOUNTER — Telehealth (INDEPENDENT_AMBULATORY_CARE_PROVIDER_SITE_OTHER): Payer: Medicare Other | Admitting: Cardiology

## 2018-12-28 VITALS — Ht 66.0 in | Wt 170.0 lb

## 2018-12-28 DIAGNOSIS — N183 Chronic kidney disease, stage 3 unspecified: Secondary | ICD-10-CM

## 2018-12-28 DIAGNOSIS — Z7901 Long term (current) use of anticoagulants: Secondary | ICD-10-CM

## 2018-12-28 DIAGNOSIS — E785 Hyperlipidemia, unspecified: Secondary | ICD-10-CM

## 2018-12-28 DIAGNOSIS — I10 Essential (primary) hypertension: Secondary | ICD-10-CM

## 2018-12-28 DIAGNOSIS — I4811 Longstanding persistent atrial fibrillation: Secondary | ICD-10-CM | POA: Diagnosis not present

## 2018-12-28 DIAGNOSIS — I739 Peripheral vascular disease, unspecified: Secondary | ICD-10-CM

## 2018-12-28 DIAGNOSIS — I701 Atherosclerosis of renal artery: Secondary | ICD-10-CM

## 2018-12-28 NOTE — Progress Notes (Signed)
Virtual Visit via Telephone Note   This visit type was conducted due to national recommendations for restrictions regarding the COVID-19 Pandemic (e.g. social distancing) in an effort to limit this patient's exposure and mitigate transmission in our community.  Due to her co-morbid illnesses, this patient is at least at moderate risk for complications without adequate follow up.  This format is felt to be most appropriate for this patient at this time.  The patient did not have access to video technology/had technical difficulties with video requiring transitioning to audio format only (telephone).  All issues noted in this document were discussed and addressed.  No physical exam could be performed with this format.  Please refer to the patient's chart for her  consent to telehealth for [facility].   Date:  12/28/2018   ID:  Kathy Hobbs, DOB January 07, 1941, MRN [id number]  Patient Location: Home Provider Location: Office  PCP:  Ma Hillock, DO  Cardiologist:  Pixie Casino, MD  Electrophysiologist:  None   Evaluation Performed:  Follow-Up Visit  Chief Complaint:  None  History of Present Illness:    Kathy Hobbs is a pleasnt 78 y.o. female with a hx of asymptomatic permanent atrial fibrilation. She was placed on warfarin by her primary care provider in [place] prior to moving up to [place] to live with her daughter. We manage her INR and this has been stable. Kathy Hobbs also has a significant history of PAD. She has a 40% right carotid artery stenosis by doppler in 2016. She has no history of stroke or TIA.Marland Kitchen She also has a history of PAD and LEA peripheral stents as well as bilateral renal artery PCI by Dr. Sullivan Lone in [facility]. She had dopplers in 2017 that showed patent renal arteries and normal ABI's. Carotid dopplers in Aug 2019 showed 1-39% bilateral ICA stenosis. She was contacted today for routine follow up.   She and her husband are "stuck in [place]"  secondary to [place] pandemic.  She tells me they have a home in [place].  They are social distancing and fortunately are not in a situation where they have to come back to [place].  She is having no issues with her medications.  She was checking her B/P at a pharmacy but the pharmacy closed except for drive up Rx. She has some "fatigue" but this is not new.   The patient does not have symptoms concerning for COVID-19 infection (fever, chills, cough, or new shortness of breath).    Past Medical History:  Diagnosis Date  . Atrial fibrillation ([place])   . Breast cancer ([place])   . Depression   . Hyperlipidemia   . Hypertension   . Right humeral fracture 2016  . Skin cancer of nose    Unknown type, patient reports needed graft  . Urine incontinence   . Vertigo    Past Surgical History:  Procedure Laterality Date  . BREAST BIOPSY    . BREAST LUMPECTOMY    . FEMORAL ARTERY STENT     ? pt states "Stent in my leg"  . RENAL ARTERY STENT Bilateral      Current Meds  Medication Sig  . atorvastatin (LIPITOR) 80 MG tablet TAKE 1 TABLET BY MOUTH EVERY DAY.  . cholecalciferol (VITAMIN D) 1000 units tablet Take 1,000 Units by mouth daily.  . citalopram (CELEXA) 20 MG tablet Take 1 tablet (20 mg total) by mouth daily.  . irbesartan (AVAPRO) 300 MG tablet Take 1 tablet (300 mg  total) by mouth daily.  . sertraline (ZOLOFT) 100 MG tablet Take 1 tablet (100 mg total) by mouth daily.  Marland Kitchen spironolactone (ALDACTONE) 25 MG tablet Take 0.5 tablets (12.5 mg total) by mouth daily.  Marland Kitchen warfarin (COUMADIN) 2 MG tablet TAKE 1/2 TO 1 TABLET DAILY AS DIRECTED BY COUMADIN CLINIC     Allergies:   Patient has no known allergies.   Social History   Tobacco Use  . Smoking status: Never Smoker  . Smokeless tobacco: Never Used  Substance Use Topics  . Alcohol use: No    Alcohol/week: 0.0 standard drinks  . Drug use: No     Family Hx: The patient's family history includes Alcohol abuse in her brother;  Alcoholism in her brother; Breast cancer in her paternal grandmother; Depression in her brother and mother; Heart attack in her father.  ROS:   Please see the history of present illness.    All other systems reviewed and are negative.   Prior CV studies:   The following studies were reviewed today:  Dopplers Aug 2019  Labs/Other Tests and Data Reviewed:    EKG:  No ECG reviewed.  Recent Labs: No results found for requested labs within last 8760 hours.   Recent Lipid Panel Lab Results  Component Value Date/Time   CHOL 140 06/27/2016 10:08 AM   TRIG 93 06/27/2016 10:08 AM   HDL 54 06/27/2016 10:08 AM   CHOLHDL 2.6 06/27/2016 10:08 AM   LDLCALC 67 06/27/2016 10:08 AM    Wt Readings from Last 3 Encounters:  12/28/18 170 lb (77.1 kg)  02/10/18 174 lb (78.9 kg)  12/25/17 173 lb (78.5 kg)     Objective:    Vital Signs:  Ht 5\' 6"  (1.676 m)   Wt 170 lb (77.1 kg)   BMI 27.44 kg/m    VITAL SIGNS:  reviewed  ASSESSMENT & PLAN:    Atrial fibrillation ([place]) This appears to be CAF. She had falls in 2018 and Coumadin was held but has since been resumed.  CHADS VASC=5  Long term (current) use of anticoagulants [Z79.01] Couamdin  Essential hypertension, benign Controlled, normal LVF by Myoview  Dyslipidemia On statin Rx followed by PCP  Renal artery stenosis (HCC) Status post bilateral stenting in [facility] [place]-patent by doppler 2017  PAD (peripheral artery disease) ([place]) Prior LEA peripheral stents in [place]-ABI's 1.3-1.2 in 2017  Chronic kidney disease, stage 3 GFR in the 50's  Fatigue Generalized fatigue, I don't think this is related to her AF.  COVID-19 Education: The signs and symptoms of COVID-19 were discussed with the patient and how to seek care for testing (follow up with PCP or arrange E-visit).  The importance of social distancing was discussed today.  Time:   Today, I have spent 15 minutes with the patient with telehealth  technology discussing the above problems.     Medication Adjustments/Labs and Tests Ordered: Current medicines are reviewed at length with the patient today.  Concerns regarding medicines are outlined above.   Tests Ordered: No orders of the defined types were placed in this encounter.   Medication Changes: No orders of the defined types were placed in this encounter.   Follow Up:  In Person In the Fall with Dr Debara Pickett if it is safe to travel.   Angelena Form, PA-C  12/28/2018 10:34 AM    [facility]

## 2018-12-28 NOTE — Telephone Encounter (Signed)

## 2018-12-28 NOTE — Patient Instructions (Signed)
Medication Instructions:  Your physician recommends that you continue on your current medications as directed. Please refer to the Current Medication list given to you today. If you need a refill on your cardiac medications before your next appointment, please call your pharmacy.   Lab work: None  If you have labs (blood work) drawn today and your tests are completely normal, you will receive your results only by: Marland Kitchen MyChart Message (if you have MyChart) OR . A paper copy in the mail If you have any lab test that is abnormal or we need to change your treatment, we will call you to review the results.  Testing/Procedures: None   Follow-Up: At [facility], you and your health needs are our priority.  As part of our continuing mission to provide you with exceptional heart care, we have created designated Provider Care Teams.  These Care Teams include your primary Cardiologist (physician) and Advanced Practice Providers (APPs -  Physician Assistants and Nurse Practitioners) who all work together to provide you with the care you need, when you need it. You will need a follow up appointment in 3 months.  Please call our office 2 months in advance to schedule this appointment.  You may see Pixie Casino, MD or one of the following Advanced Practice Providers on your designated Care Team: [place], [place] . Fabian Sharp, PA-C  Any Other Special Instructions Will Be Listed Below (If Applicable). PLEASE STAY SAFE

## 2018-12-28 NOTE — Telephone Encounter (Signed)
Luke informed patient that I would mail her a copy of the AVS and she voiced understanding with no additional questions.

## 2018-12-28 NOTE — Telephone Encounter (Signed)
Left message for patient to call and schedule 3 month follow up with Dr. Alain Honey per 12/28/18 staff message.

## 2019-01-01 NOTE — Telephone Encounter (Signed)
Left message for patient to call and schedule follow up appointment with Dr. Debara Pickett in the fall.

## 2019-01-18 DIAGNOSIS — I4891 Unspecified atrial fibrillation: Secondary | ICD-10-CM | POA: Diagnosis not present

## 2019-01-18 DIAGNOSIS — Z7901 Long term (current) use of anticoagulants: Secondary | ICD-10-CM | POA: Diagnosis not present

## 2019-01-19 ENCOUNTER — Other Ambulatory Visit: Payer: Self-pay | Admitting: Internal Medicine

## 2019-01-19 LAB — PROTIME-INR: INR: 4.3 — AB (ref 0.9–1.1)

## 2019-01-22 ENCOUNTER — Ambulatory Visit (INDEPENDENT_AMBULATORY_CARE_PROVIDER_SITE_OTHER): Payer: Medicare Other | Admitting: Pharmacist Clinician (PhC)/ Clinical Pharmacy Specialist

## 2019-01-22 ENCOUNTER — Other Ambulatory Visit: Payer: Self-pay | Admitting: Cardiology

## 2019-01-22 DIAGNOSIS — Z7901 Long term (current) use of anticoagulants: Secondary | ICD-10-CM

## 2019-01-22 DIAGNOSIS — I10 Essential (primary) hypertension: Secondary | ICD-10-CM

## 2019-01-22 DIAGNOSIS — I4811 Longstanding persistent atrial fibrillation: Secondary | ICD-10-CM | POA: Diagnosis not present

## 2019-02-02 DIAGNOSIS — L821 Other seborrheic keratosis: Secondary | ICD-10-CM | POA: Diagnosis not present

## 2019-02-02 DIAGNOSIS — D492 Neoplasm of unspecified behavior of bone, soft tissue, and skin: Secondary | ICD-10-CM | POA: Diagnosis not present

## 2019-02-02 DIAGNOSIS — D0439 Carcinoma in situ of skin of other parts of face: Secondary | ICD-10-CM | POA: Diagnosis not present

## 2019-02-02 DIAGNOSIS — C44719 Basal cell carcinoma of skin of left lower limb, including hip: Secondary | ICD-10-CM | POA: Diagnosis not present

## 2019-02-02 DIAGNOSIS — L814 Other melanin hyperpigmentation: Secondary | ICD-10-CM | POA: Diagnosis not present

## 2019-02-02 DIAGNOSIS — C44712 Basal cell carcinoma of skin of right lower limb, including hip: Secondary | ICD-10-CM | POA: Diagnosis not present

## 2019-02-02 DIAGNOSIS — L570 Actinic keratosis: Secondary | ICD-10-CM | POA: Diagnosis not present

## 2019-02-02 DIAGNOSIS — D1801 Hemangioma of skin and subcutaneous tissue: Secondary | ICD-10-CM | POA: Diagnosis not present

## 2019-02-04 DIAGNOSIS — Z23 Encounter for immunization: Secondary | ICD-10-CM | POA: Diagnosis not present

## 2019-02-13 ENCOUNTER — Other Ambulatory Visit: Payer: Self-pay | Admitting: Internal Medicine

## 2019-02-16 DIAGNOSIS — I4891 Unspecified atrial fibrillation: Secondary | ICD-10-CM | POA: Diagnosis not present

## 2019-02-16 DIAGNOSIS — Z7901 Long term (current) use of anticoagulants: Secondary | ICD-10-CM | POA: Diagnosis not present

## 2019-02-17 ENCOUNTER — Ambulatory Visit (INDEPENDENT_AMBULATORY_CARE_PROVIDER_SITE_OTHER): Payer: Medicare Other | Admitting: Cardiovascular Disease

## 2019-02-17 DIAGNOSIS — I4811 Longstanding persistent atrial fibrillation: Secondary | ICD-10-CM

## 2019-02-17 DIAGNOSIS — Z7901 Long term (current) use of anticoagulants: Secondary | ICD-10-CM

## 2019-02-17 DIAGNOSIS — E785 Hyperlipidemia, unspecified: Secondary | ICD-10-CM

## 2019-02-17 LAB — POCT INR: INR: 2.6 (ref 2.0–3.0)

## 2019-02-17 MED ORDER — ATORVASTATIN CALCIUM 80 MG PO TABS: ORAL_TABLET | ORAL | 0 refills | Status: DC

## 2019-02-26 DIAGNOSIS — L570 Actinic keratosis: Secondary | ICD-10-CM | POA: Diagnosis not present

## 2019-02-26 DIAGNOSIS — D0439 Carcinoma in situ of skin of other parts of face: Secondary | ICD-10-CM | POA: Diagnosis not present

## 2019-03-04 ENCOUNTER — Telehealth: Payer: Self-pay | Admitting: Family Medicine

## 2019-03-04 NOTE — Telephone Encounter (Signed)
Called patient and husband answered. He said that he will have her call back to schedule a VV

## 2019-03-04 NOTE — Telephone Encounter (Signed)
Sure.  Video visit is fine

## 2019-03-04 NOTE — Telephone Encounter (Signed)
Patient is requesting 90 day supply of citalopram (CELEXA) 20 MG tablet. Patient is stuck in [place] since she is afraid to fly due to [place]. Patient is close to [place], [place], [place].

## 2019-03-04 NOTE — Telephone Encounter (Signed)
Patient is due for a visit. Are you okay with a VV since patient is in [place] before refilling medication? Please let me know

## 2019-03-09 DIAGNOSIS — C4491 Basal cell carcinoma of skin, unspecified: Secondary | ICD-10-CM | POA: Diagnosis not present

## 2019-03-09 DIAGNOSIS — D0439 Carcinoma in situ of skin of other parts of face: Secondary | ICD-10-CM | POA: Diagnosis not present

## 2019-03-09 DIAGNOSIS — C44719 Basal cell carcinoma of skin of left lower limb, including hip: Secondary | ICD-10-CM | POA: Diagnosis not present

## 2019-03-10 ENCOUNTER — Encounter: Payer: Self-pay | Admitting: Family Medicine

## 2019-03-10 ENCOUNTER — Other Ambulatory Visit: Payer: Self-pay

## 2019-03-10 ENCOUNTER — Ambulatory Visit (INDEPENDENT_AMBULATORY_CARE_PROVIDER_SITE_OTHER): Payer: Medicare Other | Admitting: Family Medicine

## 2019-03-10 VITALS — Ht 66.0 in | Wt 169.2 lb

## 2019-03-10 DIAGNOSIS — F32A Depression, unspecified: Secondary | ICD-10-CM

## 2019-03-10 DIAGNOSIS — F329 Major depressive disorder, single episode, unspecified: Secondary | ICD-10-CM

## 2019-03-10 DIAGNOSIS — F339 Major depressive disorder, recurrent, unspecified: Secondary | ICD-10-CM

## 2019-03-10 MED ORDER — CITALOPRAM HYDROBROMIDE 20 MG PO TABS: 20.0000 mg | ORAL_TABLET | Freq: Every day | ORAL | 1 refills | Status: DC

## 2019-03-10 MED ORDER — SERTRALINE HCL 100 MG PO TABS: 100.0000 mg | ORAL_TABLET | Freq: Every day | ORAL | 1 refills | Status: DC

## 2019-03-10 NOTE — Progress Notes (Signed)
Chief Complaint  Patient presents with  . Depression    Pt is doing well with no complaints.  Pt is hoping to come back to [place] this month. Would like 90 day supply of medication if possible.      Virtual Visit via Telephone Note  I connected with Kathy Hobbs on 03/10/19 at  3:30 PM EDT by telephone and verified that I am speaking with the correct person using two identifiers.   I discussed the limitations, risks, security and privacy concerns of performing an evaluation and management service by telephone and the availability of in person appointments. I also discussed with the patient that there may be a patient responsible charge related to this service. The patient expressed understanding and agreed to proceed.   History of Present Illness: Patient is in need of her refills on her depression medications. She is prescribed celexa 20 mg QD and zoloft 100 mg QD. She and her husband are currently "stuck" in [place] at their other home. Pt reports she is doing well on her medication regimen. She is hoping to be able to come home to [place] within the next month.    Observations/Objective: Gen: Afebrile. No acute distress.  Chest: CTAB, no wheeze or crackles Neuro: Alert. Oriented x3  Psych: Normal affect, dress and demeanor. Normal speech. Normal thought content and judgment.    Depression screen [facility] 2/9 03/10/2019 08/28/2018 02/10/2018 08/08/2017 07/24/2016  Decreased Interest 0 0 0 2 0  Down, Depressed, Hopeless 0 0 0 2 0  PHQ - 2 Score 0 0 0 4 0  Altered sleeping 0 0 0 2 -  Tired, decreased energy 0 0 0 3 -  Change in appetite 0 0 0 2 -  Feeling bad or failure about yourself  0 0 0 2 -  Trouble concentrating 0 0 0 1 -  Moving slowly or fidgety/restless 0 0 0 2 -  Suicidal thoughts 0 0 0 2 -  PHQ-9 Score 0 0 0 18 -  Difficult doing work/chores Not difficult at all Not difficult at all Not difficult at all - -   GAD 7 : Generalized Anxiety Score 08/28/2018 08/08/2017  Nervous, Anxious,  on Edge 0 2  Control/stop worrying 0 2  Worry too much - different things 0 2  Trouble relaxing 0 2  Restless 0 1  Easily annoyed or irritable 0 2  Afraid - awful might happen 0 2  Total GAD 7 Score 0 13  Anxiety Difficulty Not difficult at all -   Assessment and Plan: Depression, unspecified depression type - stable. No complaints - doing well and staying safe during COVID19. -  Refills provided to today on zoloft 100 mg QD and celexa 20 mg Qd. - f/u 6 months.   Follow Up Instructions: 6 months  12 minutes spent with patient today.    I discussed the assessment and treatment plan with the patient. The patient was provided an opportunity to ask questions and all were answered. The patient agreed with the plan and demonstrated an understanding of the instructions.   The patient was advised to call back or seek an in-person evaluation if the symptoms worsen or if the condition fails to improve as anticipated.  I provided 14 minutes of non-face-to-face time during this encounter.   Howard Pouch, DO

## 2019-03-11 ENCOUNTER — Other Ambulatory Visit: Payer: Self-pay | Admitting: Internal Medicine

## 2019-03-11 NOTE — Telephone Encounter (Signed)
Refill Request.  

## 2019-03-17 DIAGNOSIS — D0439 Carcinoma in situ of skin of other parts of face: Secondary | ICD-10-CM | POA: Diagnosis not present

## 2019-03-17 DIAGNOSIS — C44712 Basal cell carcinoma of skin of right lower limb, including hip: Secondary | ICD-10-CM | POA: Diagnosis not present

## 2019-04-03 ENCOUNTER — Other Ambulatory Visit: Payer: Self-pay | Admitting: Internal Medicine

## 2019-04-26 ENCOUNTER — Other Ambulatory Visit (INDEPENDENT_AMBULATORY_CARE_PROVIDER_SITE_OTHER): Payer: Medicare Other

## 2019-04-26 DIAGNOSIS — I4891 Unspecified atrial fibrillation: Secondary | ICD-10-CM

## 2019-05-04 ENCOUNTER — Other Ambulatory Visit: Payer: Self-pay | Admitting: Internal Medicine

## 2019-05-07 ENCOUNTER — Other Ambulatory Visit: Payer: Self-pay

## 2019-05-07 ENCOUNTER — Ambulatory Visit: Payer: Medicare Other | Admitting: Pharmacist

## 2019-05-07 DIAGNOSIS — Z7901 Long term (current) use of anticoagulants: Secondary | ICD-10-CM

## 2019-05-07 DIAGNOSIS — I4891 Unspecified atrial fibrillation: Secondary | ICD-10-CM

## 2019-05-07 LAB — POCT INR: INR: 2.5 (ref 2.0–3.0)

## 2019-05-07 MED ORDER — WARFARIN SODIUM 2 MG PO TABS: ORAL_TABLET | ORAL | 0 refills | Status: DC

## 2019-05-18 ENCOUNTER — Other Ambulatory Visit: Payer: Self-pay | Admitting: Pharmacist Clinician (PhC)/ Clinical Pharmacy Specialist

## 2019-05-18 DIAGNOSIS — E785 Hyperlipidemia, unspecified: Secondary | ICD-10-CM

## 2019-05-18 MED ORDER — ATORVASTATIN CALCIUM 80 MG PO TABS: ORAL_TABLET | ORAL | 0 refills | Status: DC

## 2019-05-18 NOTE — Progress Notes (Signed)
Virtual Visit via Telephone Note   This visit type was conducted due to national recommendations for restrictions regarding the COVID-19 Pandemic (e.g. social distancing) in an effort to limit this patient's exposure and mitigate transmission in our community.  Due to her co-morbid illnesses, this patient is at least at moderate risk for complications without adequate follow up.  This format is felt to be most appropriate for this patient at this time.  The patient did not have access to video technology/had technical difficulties with video requiring transitioning to audio format only (telephone).  All issues noted in this document were discussed and addressed.  No physical exam could be performed with this format.  Please refer to the patient's chart for her  consent to telehealth for [facility].   Date:  05/18/2019   ID:  Kathy Hobbs, DOB Aug 18, 1940, MRN [id number]  Patient Location: Home Provider Location: Office  PCP:  Ma Hillock, DO  Cardiologist:  Pixie Casino, MD  Electrophysiologist:  None   Evaluation Performed:  Follow-Up Visit  Chief Complaint: Follow-up  History of Present Illness:    Kathy Hobbs is a 78 y.o. female with a history of asymptomatic permanent atrial fibrillation, chronically anticoagulated with Coumadin.  Hypertension is managed with irbesartan and spironolactone.  She has carotid artery stenosis with 40% right carotid artery stenosis by Doppler in 2016.  She denies history of stroke or TIA.  She has a history of PAD and LDA peripheral stents as well as bilateral renal artery PCI in [place].  Dopplers in 2017 with patent renal arteries and normal ABIs.  Carotid Dopplers in August 2019 with 1 to 39% bilateral ICA stenosis.  She was last seen by Kerin Ransom, PA-C on 12/28/2018 via telehealth visit.  She and her husband are in [place] during the pandemic and are social distancing.   She presents to the clinic today and states she feels well.  She  states she has been less active with the COVID-19 virus.  She has some shortness of breath with increased physical activity but no shortness of breath with normal activities.  She states her and her husband stayed in [place] through the fall to avoid the COVID-19 virus but have returned home to [place] for the Christmas holidays.  They ended up driving from her home in [place] back to [place] to avoid flying.  They will return to [place] in the spring.  When she presents for her next INR check on 06/11/2019 I will have a BMP and lipid panel drawn as well as blood pressure check.  She denies chest pain, shortness of breath, lower extremity edema, fatigue, palpitations, melena, hematuria, hemoptysis, diaphoresis, weakness, presyncope, syncope, orthopnea, and PND.   The patient does not have symptoms concerning for COVID-19 infection (fever, chills, cough, or new shortness of breath).    Past Medical History:  Diagnosis Date  . Atrial fibrillation ([place])   . Breast cancer ([place])   . Depression   . Hyperlipidemia   . Hypertension   . Right humeral fracture 2016  . Skin cancer of nose    Unknown type, patient reports needed graft  . Urine incontinence   . Vertigo    Past Surgical History:  Procedure Laterality Date  . BREAST BIOPSY    . BREAST LUMPECTOMY    . FEMORAL ARTERY STENT     ? pt states "Stent in my leg"  . RENAL ARTERY STENT Bilateral      No outpatient medications  have been marked as taking for the 05/19/19 encounter (Appointment) with Ledora Bottcher, [place].     Allergies:   Patient has no known allergies.   Social History   Tobacco Use  . Smoking status: Never Smoker  . Smokeless tobacco: Never Used  Substance Use Topics  . Alcohol use: No    Alcohol/week: 0.0 standard drinks  . Drug use: No     Family Hx: The patient's family history includes Alcohol abuse in her brother; Alcoholism in her brother; Breast cancer in her paternal grandmother;  Depression in her brother and mother; Heart attack in her father.  ROS:   Please see the history of present illness.     All other systems reviewed and are negative.   Prior CV studies:   The following studies were reviewed today:  [place] artery dopplers 01/07/18: 1-39% B ICA stenosis  Renal artery dopplers 05/22/16: Patent renal artery stents - repeat renal dopplers in 1 year.  Labs/Other Tests and Data Reviewed:    EKG:  No ECG reviewed.  Recent Labs: No results found for requested labs within last 8760 hours.   Recent Lipid Panel Lab Results  Component Value Date/Time   CHOL 140 06/27/2016 10:08 AM   TRIG 93 06/27/2016 10:08 AM   HDL 54 06/27/2016 10:08 AM   CHOLHDL 2.6 06/27/2016 10:08 AM   LDLCALC 67 06/27/2016 10:08 AM    Wt Readings from Last 3 Encounters:  03/10/19 169 lb 4 oz (76.8 kg)  12/28/18 170 lb (77.1 kg)  02/10/18 174 lb (78.9 kg)     Objective:    Vital Signs:  There were no vitals taken for this visit.    Unable to obtain vital signs today.  ASSESSMENT & PLAN:    Permanent atrial fibrillation-unable to check her pulse.  Does not feel palpitations or flutters with her atrial fibrillation. INR  managed by [facility] - This patients CHA2DS2-VASc Score and unadjusted Ischemic Stroke Rate (% per year) is equal to 7.2 % stroke rate/year from a score of 5 (age2, female, HTN, vascular) Continue Coumadin 2 mg tablet as directed  Essential hypertension-unable to get BP today.  Well-controlled at home with checks are local grocery store. Continue Avapro 300 mg tablet daily Continue Spironolactone 12.5 mg tablet daily Heart healthy low-sodium diet Increase physical activity as tolerated Evaluate blood pressure with INR check  Renal artery stenosis- status post bilateral stents -patent by Dopplers in 2017 BP stable  Order BMP with next INR check 06/11/2019  PAD-no lower extremity pain/claudication. -Prior LE a peripheral stents in [place] -2017 ABIs  1.3-1.2    CKD stage III-creatinine 1.25 on 12/25/2017 Repeat BMP to be drawn with next INR check  Hyperlipidemia -Continue atorvastatin 80 mg tablet daily Heart healthy low-sodium high-fiber diet Order lipid panel to be drawn with next INR check  COVID-19 Education: The signs and symptoms of COVID-19 were discussed with the patient and how to seek care for testing (follow up with PCP or arrange E-visit).  The importance of social distancing was discussed today.  Time:   Today, I have spent 25 minutes with the patient with telehealth technology discussing the above problems.     Medication Adjustments/Labs and Tests Ordered: Current medicines are reviewed at length with the patient today.  Concerns regarding medicines are outlined above.   Tests Ordered: No orders of the defined types were placed in this encounter.   Medication Changes: No orders of the defined types were placed in this encounter.  Follow Up:  Virtual Visit  in 1 year(s) with Dr. Debara Pickett.  Signed,  Jossie Ng. [facility] [address] Office [phone] Fax [phone]

## 2019-05-19 ENCOUNTER — Ambulatory Visit (INDEPENDENT_AMBULATORY_CARE_PROVIDER_SITE_OTHER): Payer: Medicare Other | Admitting: General Practice

## 2019-05-19 ENCOUNTER — Encounter: Payer: Self-pay | Admitting: General Practice

## 2019-05-19 VITALS — Ht 66.0 in | Wt 169.0 lb

## 2019-05-19 DIAGNOSIS — I739 Peripheral vascular disease, unspecified: Secondary | ICD-10-CM | POA: Diagnosis not present

## 2019-05-19 DIAGNOSIS — I10 Essential (primary) hypertension: Secondary | ICD-10-CM | POA: Diagnosis not present

## 2019-05-19 DIAGNOSIS — I4819 Other persistent atrial fibrillation: Secondary | ICD-10-CM

## 2019-05-19 DIAGNOSIS — I701 Atherosclerosis of renal artery: Secondary | ICD-10-CM

## 2019-05-19 DIAGNOSIS — E785 Hyperlipidemia, unspecified: Secondary | ICD-10-CM

## 2019-05-19 MED ORDER — ATORVASTATIN CALCIUM 80 MG PO TABS: ORAL_TABLET | ORAL | 0 refills | Status: DC

## 2019-05-19 NOTE — Patient Instructions (Signed)
Medication Instructions:  Your physician recommends that you continue on your current medications as directed. Please refer to the Current Medication list given to you today.  *If you need a refill on your cardiac medications before your next appointment, please call your pharmacy*  Lab Work: Your physician recommends that you return for a FASTING lipid profile and BMET when you come in for your INR check.  If you have labs (blood work) drawn today and your tests are completely normal, you will receive your results only by: Marland Kitchen MyChart Message (if you have MyChart) OR . A paper copy in the mail If you have any lab test that is abnormal or we need to change your treatment, we will call you to review the results.   Follow-Up: At [facility], you and your health needs are our priority.  As part of our continuing mission to provide you with exceptional heart care, we have created designated Provider Care Teams.  These Care Teams include your primary Cardiologist (physician) and Advanced Practice Providers (APPs -  Physician Assistants and Nurse Practitioners) who all work together to provide you with the care you need, when you need it.  Your next appointment:   12 month(s)  The format for your next appointment:   Either In Person or Virtual  Provider:   You may see Pixie Casino, MD or one of the following Advanced Practice Providers on your designated Care Team:    Almyra Deforest, PA-C  Fabian Sharp, PA-C or   Roby Lofts, [place]

## 2019-06-11 ENCOUNTER — Other Ambulatory Visit: Payer: Self-pay | Admitting: Internal Medicine

## 2019-06-11 DIAGNOSIS — E785 Hyperlipidemia, unspecified: Secondary | ICD-10-CM

## 2019-06-14 ENCOUNTER — Other Ambulatory Visit: Payer: Self-pay

## 2019-06-14 ENCOUNTER — Encounter: Payer: Self-pay | Admitting: Pharmacist Clinician (PhC)/ Clinical Pharmacy Specialist

## 2019-06-14 ENCOUNTER — Telehealth: Payer: Self-pay | Admitting: Family Medicine

## 2019-06-14 ENCOUNTER — Ambulatory Visit (INDEPENDENT_AMBULATORY_CARE_PROVIDER_SITE_OTHER): Payer: Medicare Other | Admitting: Pharmacist Clinician (PhC)/ Clinical Pharmacy Specialist

## 2019-06-14 DIAGNOSIS — Z7901 Long term (current) use of anticoagulants: Secondary | ICD-10-CM

## 2019-06-14 DIAGNOSIS — F339 Major depressive disorder, recurrent, unspecified: Secondary | ICD-10-CM

## 2019-06-14 DIAGNOSIS — I4891 Unspecified atrial fibrillation: Secondary | ICD-10-CM | POA: Diagnosis not present

## 2019-06-14 LAB — POCT INR: INR: 2.4 (ref 2.0–3.0)

## 2019-06-14 MED ORDER — SERTRALINE HCL 100 MG PO TABS: 100.0000 mg | ORAL_TABLET | Freq: Every day | ORAL | 0 refills | Status: DC

## 2019-06-14 MED ORDER — CITALOPRAM HYDROBROMIDE 20 MG PO TABS: 20.0000 mg | ORAL_TABLET | Freq: Every day | ORAL | 0 refills | Status: DC

## 2019-06-14 MED ORDER — IRBESARTAN 300 MG PO TABS: 300.0000 mg | ORAL_TABLET | Freq: Every day | ORAL | 3 refills | Status: DC

## 2019-06-14 NOTE — Telephone Encounter (Signed)
Medication sent in. Patient will need an appointment for further refills

## 2019-06-14 NOTE — Telephone Encounter (Signed)
Patient refill request  sertraline (ZOLOFT) 100 MG tablet [id number]   citalopram (CELEXA) 20 MG tablet [id number]    [id number] - [place], [place] - [id number]   Please make sure prescriptions do NOT go to [place]. Patient is in [place] at this time.

## 2019-06-29 ENCOUNTER — Ambulatory Visit: Payer: Medicare Other

## 2019-07-06 DIAGNOSIS — Z01419 Encounter for gynecological examination (general) (routine) without abnormal findings: Secondary | ICD-10-CM | POA: Diagnosis not present

## 2019-07-06 DIAGNOSIS — C50919 Malignant neoplasm of unspecified site of unspecified female breast: Secondary | ICD-10-CM | POA: Insufficient documentation

## 2019-07-08 ENCOUNTER — Ambulatory Visit: Payer: Medicare Other

## 2019-07-08 DIAGNOSIS — R3915 Urgency of urination: Secondary | ICD-10-CM | POA: Diagnosis not present

## 2019-07-10 ENCOUNTER — Ambulatory Visit: Payer: Medicare Other

## 2019-07-14 DIAGNOSIS — R32 Unspecified urinary incontinence: Secondary | ICD-10-CM | POA: Diagnosis not present

## 2019-08-06 ENCOUNTER — Ambulatory Visit (INDEPENDENT_AMBULATORY_CARE_PROVIDER_SITE_OTHER): Payer: Medicare Other | Admitting: Pharmacist Clinician (PhC)/ Clinical Pharmacy Specialist

## 2019-08-06 ENCOUNTER — Other Ambulatory Visit: Payer: Self-pay

## 2019-08-06 DIAGNOSIS — I4891 Unspecified atrial fibrillation: Secondary | ICD-10-CM | POA: Diagnosis not present

## 2019-08-06 DIAGNOSIS — Z7901 Long term (current) use of anticoagulants: Secondary | ICD-10-CM | POA: Diagnosis not present

## 2019-08-06 LAB — POCT INR: INR: 2 (ref 2.0–3.0)

## 2019-08-17 DIAGNOSIS — R102 Pelvic and perineal pain: Secondary | ICD-10-CM | POA: Diagnosis not present

## 2019-08-20 ENCOUNTER — Other Ambulatory Visit: Payer: Self-pay | Admitting: Internal Medicine

## 2019-09-17 ENCOUNTER — Ambulatory Visit (INDEPENDENT_AMBULATORY_CARE_PROVIDER_SITE_OTHER): Payer: Medicare Other | Admitting: Pharmacist Clinician (PhC)/ Clinical Pharmacy Specialist

## 2019-09-17 ENCOUNTER — Other Ambulatory Visit: Payer: Self-pay

## 2019-09-17 DIAGNOSIS — I4891 Unspecified atrial fibrillation: Secondary | ICD-10-CM | POA: Diagnosis not present

## 2019-09-17 DIAGNOSIS — Z7901 Long term (current) use of anticoagulants: Secondary | ICD-10-CM

## 2019-09-17 LAB — POCT INR: INR: 2 (ref 2.0–3.0)

## 2019-10-22 ENCOUNTER — Other Ambulatory Visit: Payer: Self-pay

## 2019-10-22 ENCOUNTER — Encounter: Payer: Self-pay | Admitting: Family Medicine

## 2019-10-22 ENCOUNTER — Ambulatory Visit (INDEPENDENT_AMBULATORY_CARE_PROVIDER_SITE_OTHER): Payer: Medicare Other | Admitting: Family Medicine

## 2019-10-22 VITALS — BP 124/82 | HR 74 | Temp 97.9°F | Resp 17 | Ht 66.0 in | Wt 169.0 lb

## 2019-10-22 DIAGNOSIS — F418 Other specified anxiety disorders: Secondary | ICD-10-CM | POA: Diagnosis not present

## 2019-10-22 DIAGNOSIS — R05 Cough: Secondary | ICD-10-CM

## 2019-10-22 DIAGNOSIS — F339 Major depressive disorder, recurrent, unspecified: Secondary | ICD-10-CM

## 2019-10-22 DIAGNOSIS — R251 Tremor, unspecified: Secondary | ICD-10-CM | POA: Diagnosis not present

## 2019-10-22 DIAGNOSIS — R059 Cough, unspecified: Secondary | ICD-10-CM

## 2019-10-22 NOTE — Progress Notes (Signed)
This visit occurred during the SARS-CoV-2 public health emergency.  Safety protocols were in place, including screening questions prior to the visit, additional usage of staff PPE, and extensive cleaning of exam room while observing appropriate contact time as indicated for disinfecting solutions.    Kathy Hobbs , [birth year], 79 y.o., female MRN: [id number] Patient Care Team    Relationship Specialty Notifications Start End  Ma Hillock, DO PCP - General Family Medicine  02/28/15   Pixie Casino, MD PCP - Cardiology Cardiology Admissions 12/25/17     Chief Complaint  Patient presents with  . Depression    Needs refills. Pt concerend with being on zoloft and Celexa and vertigo she has had.   . Tremors     Subjective: Pt presents for an OV with her daughter to discuss her depression and anxiety. Depression/anxiety: Patient has been prescribed Zoloft and Celexa combination for many years to help with her depression anxiety.  She feels like her symptoms are well controlled.  Her family does have concerns that she does not seem very motivated and sleeps off and on throughout the day.  Her daughter reports she seems to be content watching her TV shows throughout the day.  Patient reports she does not feel unmotivated.  She enjoys watching her TV shows and that brings her happiness.  She knows she should get up and move more and get more exercise.  Her husband does the majority of the house cleaning and cooking.  She has had gait disturbances and vertigo for a couple years.  She recently saw an ENT which suggested it could be caused by her Zoloft or Celexa.  These have been chronic meds for her preceding her gait disturbances and vertigo, but she has not had a recent renal function test.  She has been part of the year in [place] and has returned recently.  She has started vestibular rehab for her vertigo and feels it has been helpful.  She does report a tremor that has been present for  a few years as well.  Her family has noticed that it has worsened especially when performing tasks such as feeding herself or carrying a cup or a dish etc.  They have seen neurology in the past.  They are interested in considering referral to a neurologist that specializes in movement disorders. - stable. No complaints - doing well and staying safe during COVID19. -  Refills provided to today on zoloft 100 mg QD and celexa 20 mg Qd. - f/u 6 months.  Depression screen [facility] 2/9 10/22/2019 03/10/2019 08/28/2018 02/10/2018 08/08/2017  Decreased Interest 0 0 0 0 2  Down, Depressed, Hopeless 0 0 0 0 2  PHQ - 2 Score 0 0 0 0 4  Altered sleeping 0 0 0 0 2  Tired, decreased energy 3 0 0 0 3  Change in appetite 0 0 0 0 2  Feeling bad or failure about yourself  0 0 0 0 2  Trouble concentrating 0 0 0 0 1  Moving slowly or fidgety/restless 1 0 0 0 2  Suicidal thoughts 0 0 0 0 2  PHQ-9 Score 4 0 0 0 18  Difficult doing work/chores Not difficult at all Not difficult at all Not difficult at all Not difficult at all -   GAD 7 : Generalized Anxiety Score 08/28/2018 08/08/2017  Nervous, Anxious, on Edge 0 2  Control/stop worrying 0 2  Worry too much - different things 0 2  Trouble  relaxing 0 2  Restless 0 1  Easily annoyed or irritable 0 2  Afraid - awful might happen 0 2  Total GAD 7 Score 0 13  Anxiety Difficulty Not difficult at all -   No Known Allergies Social History   Social History Narrative   Married. Currently lives with daughter in [place] from [place] secondary to declining health.    Wears seatbelt, wears bike helmet.    - does not exercise daily.    - takes vitamins.    - Smoke alarm in the home. Guns in the home, locked case.   - feels safe in her relationships.       Epworth Sleepiness Scale = 10 (04/04/2015)      Homemaker      Highest level of education:  High school   Past Medical History:  Diagnosis Date  . Atrial fibrillation ([place])   . Breast cancer ([place])   . Depression    . Hyperlipidemia   . Hypertension   . Right humeral fracture 2016  . Skin cancer of nose    Unknown type, patient reports needed graft  . Urine incontinence   . Vertigo    Past Surgical History:  Procedure Laterality Date  . BREAST BIOPSY    . BREAST LUMPECTOMY    . FEMORAL ARTERY STENT     ? pt states "Stent in my leg"  . RENAL ARTERY STENT Bilateral    Family History  Problem Relation Age of Onset  . Heart attack Father   . Depression Mother   . Alcohol abuse Brother   . Breast cancer Paternal Grandmother   . Alcoholism Brother   . Depression Brother    Allergies as of 10/22/2019   No Known Allergies     Medication List       Accurate as of Oct 22, 2019 11:59 PM. If you have any questions, ask your nurse or doctor.        STOP taking these medications   citalopram 20 MG tablet Commonly known as: CELEXA Stopped by: Howard Pouch, DO     TAKE these medications   atorvastatin 80 MG tablet Commonly known as: LIPITOR TAKE 1 TABLET BY MOUTH EVERY DAY   cholecalciferol 1000 units tablet Commonly known as: VITAMIN D Take 1,000 Units by mouth daily.   DULoxetine 20 MG capsule Commonly known as: Cymbalta Take 1 capsule (20 mg total) by mouth daily. Started by: Howard Pouch, DO   irbesartan 300 MG tablet Commonly known as: AVAPRO Take 1 tablet (300 mg total) by mouth daily. For blood pressure   sertraline 50 MG tablet Commonly known as: ZOLOFT Take 1 tablet (50 mg total) by mouth daily. What changed:   medication strength  how much to take Changed by: Howard Pouch, DO   spironolactone 25 MG tablet Commonly known as: ALDACTONE Take 0.5 tablets (12.5 mg total) by mouth daily.   warfarin 2 MG tablet Commonly known as: COUMADIN Take as directed by the anticoagulation clinic. If you are unsure how to take this medication, talk to your nurse or doctor. Original instructions: TAKE 1/2 TO 1 TABLET BY MOUTH DAILY AS DIRECTED       All past medical  history, surgical history, allergies, family history, immunizations andmedications were updated in the EMR today and reviewed under the history and medication portions of their EMR.     ROS: Negative, with the exception of above mentioned in HPI   Objective:  BP 124/82 (BP Location: Left  Arm, Patient Position: Sitting, Cuff Size: Normal)   Pulse 74   Temp 97.9 F (36.6 C) (Temporal)   Resp 17   Ht '5\' 6"'  (1.676 m)   Wt 169 lb (76.7 kg)   SpO2 95%   BMI 27.28 kg/m  Body mass index is 27.28 kg/m. Gen: Afebrile. No acute distress. Nontoxic in appearance, well developed, well nourished.  HENT: AT. [place].  Eyes:Pupils Equal Round Reactive to light, Extraocular movements intact,  Conjunctiva without redness, discharge or icterus. Neck/lymp/endocrine: Supple,no lymphadenopathy, no thyromegaly  CV: irreg irreg  no murmur, no edema Chest: CTAB, no wheeze or crackles. Good air movement, normal resp effort.  Neuro:  Normal gait. PERLA. EOMi. Alert. Oriented x3  Psych: Normal affect, dress and demeanor. Normal speech. Normal thought content and judgment.  No exam data present No results found. No results found for this or any previous visit (from the past 24 hour(s)).  Assessment/Plan: Kathy Hobbs is a 79 y.o. female present for OV for  Tremor Patient elected to wait on neurological referral.  Eventually she would like to be referred to a movement disorder specialist, if tremor continues to worsen - CBC w/Diff - Comp Met (CMET) - T4, free - TSH  Episode of recurrent major depressive disorder, unspecified depression episode severity (HCC)/depression with anxiety We will attempt to alter medications to give her adequate coverage but decrease her potential side effect profile for dizziness and tremor.  They are aware her tremor may not be related to her medication.  Would also be easier for her with compliance if we were able to get her eventually on one medication only for coverage.  Discontinue Celexa - decrease zoloft to 50 mg a day, consider slowly tapering off. - add Cymbalta 20 mg QD  Follow-up 6-8 weeks    Reviewed expectations re: course of current medical issues.  Discussed self-management of symptoms.  Outlined signs and symptoms indicating need for more acute intervention.  Patient verbalized understanding and all questions were answered.  Patient received an After-Visit Summary.    Orders Placed This Encounter  Procedures  . CBC w/Diff  . Comp Met (CMET)  . T4, free  . TSH   Meds ordered this encounter  Medications  . sertraline (ZOLOFT) 50 MG tablet    Sig: Take 1 tablet (50 mg total) by mouth daily.    Dispense:  90 tablet    Refill:  0    DC prior doses, dose change.  Please do not ask for refills plan to taper off medication.  . DULoxetine (CYMBALTA) 20 MG capsule    Sig: Take 1 capsule (20 mg total) by mouth daily.    Dispense:  90 capsule    Refill:  0    Patient needs appointment prior to refills .  Please discontinue any Celexa refills on file   Referral Orders  No referral(s) requested today     Note is dictated utilizing voice recognition software. Although note has been proof read prior to signing, occasional typographical errors still can be missed. If any questions arise, please do not hesitate to call for verification.   electronically signed by:  Howard Pouch, DO  [place]

## 2019-10-22 NOTE — Patient Instructions (Signed)
I will call in your medication after we get labs back and give you options on medications.  I will place the new neurology referral for you as well.    Tremor A tremor is trembling or shaking that you cannot control. Most tremors affect the hands or arms. Tremors can also affect the head, vocal cords, face, and other parts of the body. There are many types of tremors. Common types include:  Essential tremor. These usually occur in people older than 40. It may run in families and can happen in otherwise healthy people.  Resting tremor. These occur when the muscles are at rest, such as when your hands are resting in your lap. People with Parkinson's disease often have resting tremors.  Postural tremor. These occur when you try to hold a pose, such as keeping your hands outstretched.  Kinetic tremor. These occur during purposeful movement, such as trying to touch a finger to your nose.  Task-specific tremor. These may occur when you perform certain tasks such as writing, speaking, or standing.  Psychogenic tremor. These dramatically lessen or disappear when you are distracted. They can happen in people of all ages. Some types of tremors have no known cause. Tremors can also be a symptom of nervous system problems (neurological disorders) that may occur with aging. Some tremors go away with treatment, while others do not. Follow these instructions at home: Lifestyle      Limit alcohol intake to no more than 1 drink a day for nonpregnant women and 2 drinks a day for men. One drink equals 12 oz of beer, 5 oz of wine, or 1 oz of hard liquor.  Do not use any products that contain nicotine or tobacco, such as cigarettes and e-cigarettes. If you need help quitting, ask your health care provider.  Avoid extreme heat and extreme cold.  Limit your caffeine intake, as told by your health care provider.  Try to get 8 hours of sleep each night.  Find ways to manage your stress, such as meditation  or yoga. General instructions  Take over-the-counter and prescription medicines only as told by your health care provider.  Keep all follow-up visits as told by your health care provider. This is important. Contact a health care provider if you:  Develop a tremor after starting a new medicine.  Have a tremor along with other symptoms such as: ? Numbness. ? Tingling. ? Pain. ? Weakness.  Notice that your tremor gets worse.  Notice that your tremor interferes with your day-to-day life. Summary  A tremor is trembling or shaking that you cannot control.  Most tremors affect the hands or arms.  Some types of tremors have no known cause. Others may be a symptom of nervous system problems (neurological disorders).  Make sure you discuss any tremors you have with your health care provider. This information is not intended to replace advice given to you by your health care provider. Make sure you discuss any questions you have with your health care provider. Document Revised: 05/02/2017 Document Reviewed: 03/20/2017 Elsevier Patient Education  2020 [employer].

## 2019-10-23 LAB — CBC WITH DIFFERENTIAL/PLATELET
Absolute Monocytes: 466 cells/uL (ref 200–950)
Basophils Absolute: 29 cells/uL (ref 0–200)
Basophils Relative: 0.6 %
Eosinophils Absolute: 123 cells/uL (ref 15–500)
Eosinophils Relative: 2.5 %
HCT: 41.6 % (ref 35.0–45.0)
Hemoglobin: 13.6 g/dL (ref 11.7–15.5)
Lymphs Abs: 1152 cells/uL (ref 850–3900)
MCH: 30.2 pg (ref 27.0–33.0)
MCHC: 32.7 g/dL (ref 32.0–36.0)
MCV: 92.2 fL (ref 80.0–100.0)
MPV: 14.3 fL — ABNORMAL HIGH (ref 7.5–12.5)
Monocytes Relative: 9.5 %
Neutro Abs: 3131 cells/uL (ref 1500–7800)
Neutrophils Relative %: 63.9 %
Platelets: 160 10*3/uL (ref 140–400)
RBC: 4.51 10*6/uL (ref 3.80–5.10)
RDW: 12.8 % (ref 11.0–15.0)
Total Lymphocyte: 23.5 %
WBC: 4.9 10*3/uL (ref 3.8–10.8)

## 2019-10-23 LAB — TSH: TSH: 3.47 mIU/L (ref 0.40–4.50)

## 2019-10-23 LAB — COMPREHENSIVE METABOLIC PANEL
AG Ratio: 1.4 (calc) (ref 1.0–2.5)
ALT: 34 U/L — ABNORMAL HIGH (ref 6–29)
AST: 32 U/L (ref 10–35)
Albumin: 4.1 g/dL (ref 3.6–5.1)
Alkaline phosphatase (APISO): 74 U/L (ref 37–153)
BUN/Creatinine Ratio: 18 (calc) (ref 6–22)
BUN: 22 mg/dL (ref 7–25)
CO2: 26 mmol/L (ref 20–32)
Calcium: 9.6 mg/dL (ref 8.6–10.4)
Chloride: 105 mmol/L (ref 98–110)
Creat: 1.21 mg/dL — ABNORMAL HIGH (ref 0.60–0.93)
Globulin: 2.9 g/dL (calc) (ref 1.9–3.7)
Glucose, Bld: 80 mg/dL (ref 65–99)
Potassium: 4.6 mmol/L (ref 3.5–5.3)
Sodium: 140 mmol/L (ref 135–146)
Total Bilirubin: 0.9 mg/dL (ref 0.2–1.2)
Total Protein: 7 g/dL (ref 6.1–8.1)

## 2019-10-23 LAB — T4, FREE: Free T4: 0.8 ng/dL (ref 0.8–1.8)

## 2019-10-25 ENCOUNTER — Telehealth: Payer: Self-pay | Admitting: Family Medicine

## 2019-10-25 NOTE — Telephone Encounter (Signed)
Please inform patient the following information: Blood cell counts are normal. Thyroid function is normal. Kidney function is stable from prior collections. Liver function tests are stable from prior collections.  As far as her depression and anxiety medications are concerned: I would recommend the following changes: Stop Celexa, decrease Zoloft dose to 50 mg for now. Start a medication called Cymbalta, with the hopes of being able to use this medication alone and eventually tapering completely off the Zoloft.  Cymbalta has great coverage for her symptoms.  If they are in agreement to the above, I will call in the medication for her.  We would need to follow-up close with her in about 6-8 weeks to see how she is tolerating the medicine.  Also asked them if they want me to place the referral to the neurologist is specializes in movement disorders now or wait to her follow-up.

## 2019-10-26 MED ORDER — SERTRALINE HCL 50 MG PO TABS: 50.0000 mg | ORAL_TABLET | Freq: Every day | ORAL | 0 refills | Status: DC

## 2019-10-26 MED ORDER — DULOXETINE HCL 20 MG PO CPEP
20.0000 mg | ORAL_CAPSULE | Freq: Every day | ORAL | 0 refills | Status: DC
Start: 2019-10-26 — End: 2019-11-04

## 2019-10-26 NOTE — Telephone Encounter (Signed)
Noted. Cymbalta 20 mg prescribed.  Zoloft 50 mg prescribed.  Patient must follow-up 6-8 weeks.  What did they decide on the neuro referral?

## 2019-10-26 NOTE — Telephone Encounter (Signed)
Pts husband was called and message was left to return call

## 2019-10-26 NOTE — Telephone Encounter (Signed)
Pt was called and given information and results. She agreed with plan and is willing to try new medication. Please send as Generic per pt, if possible.

## 2019-10-27 ENCOUNTER — Ambulatory Visit (INDEPENDENT_AMBULATORY_CARE_PROVIDER_SITE_OTHER): Payer: Medicare Other | Admitting: Pharmacist

## 2019-10-27 ENCOUNTER — Other Ambulatory Visit: Payer: Self-pay

## 2019-10-27 DIAGNOSIS — Z7901 Long term (current) use of anticoagulants: Secondary | ICD-10-CM

## 2019-10-27 DIAGNOSIS — I4891 Unspecified atrial fibrillation: Secondary | ICD-10-CM | POA: Diagnosis not present

## 2019-10-27 LAB — POCT INR: INR: 1.8 — AB (ref 2.0–3.0)

## 2019-10-27 NOTE — Patient Instructions (Signed)
Description   Take 1 tablet today, then continue 1/2 tablet daily except 1 tablet each Friday.  Repeat INR in 4  weeks.

## 2019-10-27 NOTE — Telephone Encounter (Signed)
She declined referral at this time. Pt was asked to make appt and said that her husband would have to call back to do that.

## 2019-10-28 ENCOUNTER — Telehealth: Payer: Self-pay

## 2019-10-28 NOTE — Telephone Encounter (Signed)
Patient seen earlier this week by Dr. Raoul Pitch.  She prescribed Cymbalta. It is way too expensive for patient. Over $400.  Please advise.  Patient is aware that Dr. Raoul Pitch is out of office but is concerned because she is not sure what to do now.  Please advise. [phone]

## 2019-10-28 NOTE — Telephone Encounter (Signed)
Pt was called and I spoke with husband, he was told pt should continue all meds as she has been taking them and message will be sent to Dr Raoul Pitch to review when she returns. Husband agreed with plan. Medication would cost $200 monthly. Pt stated they did not say a prior auth could be done or was needed and that was just the cost. He was told to call back if there were any concerns, her verbalized understanding.  FYI- [facility] has coupon for [facility] for 3 month supply for $20 and [facility] for $137.

## 2019-11-02 NOTE — Telephone Encounter (Signed)
Pt was called and told to call pharmacies and ask for the cost with insurance and using [facility]- Coupon printed out and left for pt to pick up. Pt agreed with plan and will call back to let me know if she found a cheaper pharmacy

## 2019-11-02 NOTE — Telephone Encounter (Signed)
Please make sure it is generic and she checks with other pharmacies. Generic Cymbalta does not cost 200$ a month (not even 3 mos typically). I would want to be sure that is accurate and other pharmacies checked prior to switching regimen

## 2019-11-04 ENCOUNTER — Other Ambulatory Visit: Payer: Self-pay

## 2019-11-04 MED ORDER — DULOXETINE HCL 20 MG PO CPEP: 20.0000 mg | ORAL_CAPSULE | Freq: Every day | ORAL | 0 refills | Status: DC

## 2019-11-04 NOTE — Telephone Encounter (Signed)
Pt returned call and said she could get RX at [facility] for cheaper. RX cancelled at [facility] and sent to [facility].

## 2019-11-24 ENCOUNTER — Ambulatory Visit (INDEPENDENT_AMBULATORY_CARE_PROVIDER_SITE_OTHER): Payer: Medicare Other | Admitting: Pharmacist

## 2019-11-24 ENCOUNTER — Other Ambulatory Visit: Payer: Self-pay

## 2019-11-24 DIAGNOSIS — I4891 Unspecified atrial fibrillation: Secondary | ICD-10-CM | POA: Diagnosis not present

## 2019-11-24 DIAGNOSIS — Z7901 Long term (current) use of anticoagulants: Secondary | ICD-10-CM | POA: Diagnosis not present

## 2019-11-24 LAB — POCT INR: INR: 1.9 — AB (ref 2.0–3.0)

## 2019-12-20 NOTE — Telephone Encounter (Signed)
If the blood pressure is consistently <750 systolic, then would suspect the irbesartan dose is the issue and would decrease it.   Dr Lemmie Evens

## 2019-12-22 LAB — POCT INR: INR: 1.3 — AB (ref 2.0–3.0)

## 2019-12-23 LAB — PROTIME-INR
INR: 1.3 — ABNORMAL HIGH (ref 0.9–1.2)
Prothrombin Time: 14.2 s — ABNORMAL HIGH (ref 9.1–12.0)

## 2019-12-29 ENCOUNTER — Ambulatory Visit (INDEPENDENT_AMBULATORY_CARE_PROVIDER_SITE_OTHER): Payer: Medicare Other | Admitting: Cardiology

## 2019-12-29 DIAGNOSIS — Z7901 Long term (current) use of anticoagulants: Secondary | ICD-10-CM | POA: Diagnosis not present

## 2020-01-12 LAB — POCT INR: INR: 2 (ref 2.0–3.0)

## 2020-01-13 ENCOUNTER — Ambulatory Visit (INDEPENDENT_AMBULATORY_CARE_PROVIDER_SITE_OTHER): Payer: Medicare Other | Admitting: Cardiology

## 2020-01-13 DIAGNOSIS — Z7901 Long term (current) use of anticoagulants: Secondary | ICD-10-CM

## 2020-01-13 LAB — PROTIME-INR
INR: 2 — ABNORMAL HIGH (ref 0.9–1.2)
Prothrombin Time: 21.7 s — ABNORMAL HIGH (ref 9.1–12.0)

## 2020-01-15 ENCOUNTER — Other Ambulatory Visit: Payer: Self-pay | Admitting: Family Medicine

## 2020-01-15 DIAGNOSIS — F339 Major depressive disorder, recurrent, unspecified: Secondary | ICD-10-CM

## 2020-01-21 ENCOUNTER — Other Ambulatory Visit: Payer: Self-pay | Admitting: Cardiology

## 2020-01-21 DIAGNOSIS — I10 Essential (primary) hypertension: Secondary | ICD-10-CM

## 2020-01-26 ENCOUNTER — Encounter: Payer: Self-pay | Admitting: Family Medicine

## 2020-01-26 LAB — POCT INR: INR: 2 (ref 2.0–3.0)

## 2020-01-27 ENCOUNTER — Ambulatory Visit (INDEPENDENT_AMBULATORY_CARE_PROVIDER_SITE_OTHER): Payer: Medicare Other | Admitting: Internal Medicine

## 2020-01-27 DIAGNOSIS — Z7901 Long term (current) use of anticoagulants: Secondary | ICD-10-CM | POA: Diagnosis not present

## 2020-01-27 DIAGNOSIS — I4811 Longstanding persistent atrial fibrillation: Secondary | ICD-10-CM

## 2020-01-27 NOTE — Patient Instructions (Signed)
Description   continue taking warfarin 1mg  (1/2 tablet daily) except for 2mg  every Friday. Repeat INR in 4 weeks.  (patient now living in [place]).

## 2020-01-31 ENCOUNTER — Other Ambulatory Visit: Payer: Self-pay

## 2020-01-31 ENCOUNTER — Encounter: Payer: Self-pay | Admitting: Family Medicine

## 2020-01-31 ENCOUNTER — Telehealth (INDEPENDENT_AMBULATORY_CARE_PROVIDER_SITE_OTHER): Payer: Medicare Other | Admitting: Family Medicine

## 2020-01-31 DIAGNOSIS — F339 Major depressive disorder, recurrent, unspecified: Secondary | ICD-10-CM

## 2020-01-31 MED ORDER — SERTRALINE HCL 50 MG PO TABS: 50.0000 mg | ORAL_TABLET | Freq: Every day | ORAL | 1 refills | Status: DC

## 2020-01-31 MED ORDER — DULOXETINE HCL 20 MG PO CPEP: 20.0000 mg | ORAL_CAPSULE | Freq: Every day | ORAL | 1 refills | Status: DC

## 2020-01-31 NOTE — Progress Notes (Signed)
Chief Complaint  Patient presents with  . Follow-up    unable to check BP today- she is currently in [facility], she goes to local pharmacy to check and has been normal      VIRTUAL VISIT VIA VIDEO  I connected with [name] on 01/31/20 at 11:30 AM EDT by a video enabled telemedicine application and verified that I am speaking with the correct person using two identifiers. Location patient: Home Location provider: [facility], Office Persons participating in the virtual visit: Patient, Dr. Raoul Pitch and Raliegh Ip. Canter, CMA  I discussed the limitations of evaluation and management by telemedicine and the availability of in person appointments. The patient expressed understanding and agreed to proceed.   History of Present Illness: Patient is in need of her refills on her depression medications. She is prescribed Cymbalta 20 mg and zoloft 50 mg QD (regimen was altered last appt). She reports she is doing well on the medications.   Observations/Objective: Ht 5\' 6"  (1.676 m)   Wt 168 lb (76.2 kg)   BMI 27.12 kg/m  Gen: Afebrile. No acute distress. Looks great HENT: AT. [place].  Eyes:Pupils Equal Round Reactive to light, Extraocular movements intact,  Conjunctiva without redness, discharge or icterus. Neuro: Alert. Oriented x3 Psych: Normal affect, dress and demeanor. Normal speech. Normal thought content and judgment.  Depression screen [facility] 2/9 01/31/2020 10/22/2019 03/10/2019 08/28/2018 02/10/2018  Decreased Interest 0 0 0 0 0  Down, Depressed, Hopeless 0 0 0 0 0  PHQ - 2 Score 0 0 0 0 0  Altered sleeping 0 0 0 0 0  Tired, decreased energy 0 3 0 0 0  Change in appetite 0 0 0 0 0  Feeling bad or failure about yourself  0 0 0 0 0  Trouble concentrating 0 0 0 0 0  Moving slowly or fidgety/restless 0 1 0 0 0  Suicidal thoughts 0 0 0 0 0  PHQ-9 Score 0 4 0 0 0  Difficult doing work/chores - Not difficult at all Not difficult at all Not difficult at all Not difficult at all   GAD 7 :  Generalized Anxiety Score 08/28/2018 08/08/2017  Nervous, Anxious, on Edge 0 2  Control/stop worrying 0 2  Worry too much - different things 0 2  Trouble relaxing 0 2  Restless 0 1  Easily annoyed or irritable 0 2  Afraid - awful might happen 0 2  Total GAD 7 Score 0 13  Anxiety Difficulty Not difficult at all -   Assessment and Plan: Depression, unspecified depression type - stable.  -continue  zoloft 50 mg QD and cymbalta  20 mg Qd. - f/u 5.5 months.   Follow Up Instructions: 5.5 months  I discussed the assessment and treatment plan with the patient. The patient was provided an opportunity to ask questions and all were answered. The patient agreed with the plan and demonstrated an understanding of the instructions.   The patient was advised to call back or seek an in-person evaluation if the symptoms worsen or if the condition fails to improve as anticipated.     Howard Pouch, DO  Meds ordered this encounter  Medications  . sertraline (ZOLOFT) 50 MG tablet    Sig: Take 1 tablet (50 mg total) by mouth daily.    Dispense:  90 tablet    Refill:  1  . DULoxetine (CYMBALTA) 20 MG capsule    Sig: Take 1 capsule (20 mg total) by mouth daily.    Dispense:  90 capsule    Refill:  1   No orders of the defined types were placed in this encounter.

## 2020-01-31 NOTE — Progress Notes (Signed)
Pre visit review using our clinic review tool, if applicable. No additional management support is needed unless otherwise documented below in the visit note. 

## 2020-02-21 LAB — POCT INR: INR: 1.8 — AB (ref 2.0–3.0)

## 2020-02-22 LAB — PROTIME-INR
INR: 1.8 — ABNORMAL HIGH (ref 0.9–1.2)
Prothrombin Time: 18.3 s — ABNORMAL HIGH (ref 9.1–12.0)

## 2020-02-23 ENCOUNTER — Ambulatory Visit (INDEPENDENT_AMBULATORY_CARE_PROVIDER_SITE_OTHER): Payer: Medicare Other | Admitting: Cardiovascular Disease

## 2020-02-23 DIAGNOSIS — Z7901 Long term (current) use of anticoagulants: Secondary | ICD-10-CM

## 2020-03-06 ENCOUNTER — Other Ambulatory Visit: Payer: Self-pay | Admitting: Internal Medicine

## 2020-03-06 LAB — POCT INR
INR: 1.8 — AB (ref 2.0–3.0)
INR: 1.8 — AB (ref 2.0–3.0)
INR: 1.8 — AB (ref 2.0–3.0)

## 2020-03-07 ENCOUNTER — Encounter: Payer: Self-pay | Admitting: Cardiovascular Disease

## 2020-03-07 ENCOUNTER — Encounter (INDEPENDENT_AMBULATORY_CARE_PROVIDER_SITE_OTHER): Payer: Medicare Other | Admitting: Cardiovascular Disease

## 2020-03-07 DIAGNOSIS — Z7901 Long term (current) use of anticoagulants: Secondary | ICD-10-CM | POA: Diagnosis not present

## 2020-03-07 DIAGNOSIS — I4891 Unspecified atrial fibrillation: Secondary | ICD-10-CM

## 2020-03-07 LAB — POCT INR: INR: 1.8 — AB (ref 2–3)

## 2020-03-07 LAB — PROTIME-INR
INR: 1.8 — ABNORMAL HIGH (ref 0.9–1.2)
Prothrombin Time: 19.1 s — ABNORMAL HIGH (ref 9.1–12.0)

## 2020-03-07 NOTE — Progress Notes (Signed)
This encounter was created in error - please disregard.

## 2020-03-08 ENCOUNTER — Other Ambulatory Visit: Payer: Self-pay | Admitting: Pharmacist

## 2020-03-08 ENCOUNTER — Ambulatory Visit (INDEPENDENT_AMBULATORY_CARE_PROVIDER_SITE_OTHER): Payer: Medicare Other | Admitting: Cardiology

## 2020-03-08 DIAGNOSIS — Z7901 Long term (current) use of anticoagulants: Secondary | ICD-10-CM

## 2020-03-08 DIAGNOSIS — I4891 Unspecified atrial fibrillation: Secondary | ICD-10-CM

## 2020-03-08 NOTE — Progress Notes (Signed)
This encounter was created in error - please disregard.

## 2020-03-20 LAB — POCT INR: INR: 2.1 (ref 2.0–3.0)

## 2020-03-20 LAB — PROTIME-INR

## 2020-03-21 ENCOUNTER — Ambulatory Visit (INDEPENDENT_AMBULATORY_CARE_PROVIDER_SITE_OTHER): Payer: Medicare Other | Admitting: Cardiovascular Disease

## 2020-03-21 DIAGNOSIS — Z7901 Long term (current) use of anticoagulants: Secondary | ICD-10-CM | POA: Diagnosis not present

## 2020-04-17 ENCOUNTER — Other Ambulatory Visit: Payer: Self-pay | Admitting: Internal Medicine

## 2020-04-17 LAB — POCT INR: INR: 2.4 (ref 2.0–3.0)

## 2020-04-18 ENCOUNTER — Ambulatory Visit (INDEPENDENT_AMBULATORY_CARE_PROVIDER_SITE_OTHER): Payer: Medicare Other | Admitting: Cardiovascular Disease

## 2020-04-18 DIAGNOSIS — Z7901 Long term (current) use of anticoagulants: Secondary | ICD-10-CM | POA: Diagnosis not present

## 2020-04-18 LAB — PROTIME-INR
INR: 2.4 — ABNORMAL HIGH (ref 0.9–1.2)
Prothrombin Time: 24.2 s — ABNORMAL HIGH (ref 9.1–12.0)

## 2020-05-16 ENCOUNTER — Other Ambulatory Visit: Payer: Self-pay | Admitting: Internal Medicine

## 2020-05-16 DIAGNOSIS — E785 Hyperlipidemia, unspecified: Secondary | ICD-10-CM

## 2020-05-17 NOTE — Telephone Encounter (Signed)
Rx has been sent to the pharmacy electronically. ° °

## 2020-05-30 ENCOUNTER — Other Ambulatory Visit: Payer: Self-pay | Admitting: Internal Medicine

## 2020-05-30 ENCOUNTER — Other Ambulatory Visit: Payer: Self-pay

## 2020-05-30 DIAGNOSIS — Z7901 Long term (current) use of anticoagulants: Secondary | ICD-10-CM

## 2020-05-30 LAB — POCT INR: INR: 1.8 — AB (ref 2.0–3.0)

## 2020-05-31 ENCOUNTER — Ambulatory Visit (INDEPENDENT_AMBULATORY_CARE_PROVIDER_SITE_OTHER): Payer: Medicare Other | Admitting: Cardiovascular Disease

## 2020-05-31 DIAGNOSIS — Z7901 Long term (current) use of anticoagulants: Secondary | ICD-10-CM | POA: Diagnosis not present

## 2020-05-31 LAB — PROTIME-INR
INR: 1.8 — ABNORMAL HIGH (ref 0.9–1.2)
Prothrombin Time: 18.4 s — ABNORMAL HIGH (ref 9.1–12.0)

## 2020-06-03 ENCOUNTER — Other Ambulatory Visit: Payer: Self-pay | Admitting: Internal Medicine

## 2020-06-06 ENCOUNTER — Other Ambulatory Visit: Payer: Self-pay | Admitting: Internal Medicine

## 2020-06-09 ENCOUNTER — Telehealth: Payer: Self-pay | Admitting: Pharmacist

## 2020-06-09 ENCOUNTER — Telehealth: Payer: Self-pay

## 2020-06-09 DIAGNOSIS — I4811 Longstanding persistent atrial fibrillation: Secondary | ICD-10-CM

## 2020-06-09 DIAGNOSIS — I4891 Unspecified atrial fibrillation: Secondary | ICD-10-CM

## 2020-06-09 NOTE — Telephone Encounter (Signed)
Order faxed to [facility], [place] [phone] Phone [phone]

## 2020-06-09 NOTE — Telephone Encounter (Signed)
Orders faxed

## 2020-06-09 NOTE — Telephone Encounter (Signed)
Spoke to patient and confirmed Standing INR order to [facility].  She verbalized [employer]

## 2020-06-20 LAB — PROTIME-INR
INR: 2.8 — ABNORMAL HIGH (ref 0.9–1.2)
Prothrombin Time: 28.4 s — ABNORMAL HIGH (ref 9.1–12.0)

## 2020-06-21 ENCOUNTER — Ambulatory Visit (INDEPENDENT_AMBULATORY_CARE_PROVIDER_SITE_OTHER): Payer: Medicare Other | Admitting: Pharmacist

## 2020-06-21 DIAGNOSIS — I4891 Unspecified atrial fibrillation: Secondary | ICD-10-CM

## 2020-06-21 DIAGNOSIS — Z7901 Long term (current) use of anticoagulants: Secondary | ICD-10-CM

## 2020-06-26 ENCOUNTER — Other Ambulatory Visit: Payer: Self-pay | Admitting: Pharmacist Clinician (PhC)/ Clinical Pharmacy Specialist

## 2020-06-26 MED ORDER — IRBESARTAN 300 MG PO TABS: ORAL_TABLET | ORAL | 0 refills | Status: DC

## 2020-06-26 NOTE — Telephone Encounter (Signed)
Spoke with patient.  She has been followed in Coumadin clinic here for some time.  Has been in [place] since pandemic started and has been leary of travelling back to [place].  Stressed that we will fill irbesartan only for 90 tablets and that she needs to call a scheduler ASAP to get on Dr. Debara Pickett schedule.  She believes she'll be back in [place] by April 1.  Stressed that she can do both virtual and in person visits, but that she would need to be seen before she runs out of medication.  Patient voiced understanding.

## 2020-07-05 NOTE — Progress Notes (Signed)
Virtual Visit via Telephone Note   This visit type was conducted due to national recommendations for restrictions regarding the COVID-19 Pandemic (e.g. social distancing) in an effort to limit this patient's exposure and mitigate transmission in our community.  Due to her co-morbid illnesses, this patient is at least at moderate risk for complications without adequate follow up.  This format is felt to be most appropriate for this patient at this time.  The patient did not have access to video technology/had technical difficulties with video requiring transitioning to audio format only (telephone).  All issues noted in this document were discussed and addressed.  No physical exam could be performed with this format.  Please refer to the patient's chart for her  consent to telehealth for [facility].  Evaluation Performed:  Follow-up visit  This visit type was conducted due to national recommendations for restrictions regarding the COVID-19 Pandemic (e.g. social distancing).  This format is felt to be most appropriate for this patient at this time.  All issues noted in this document were discussed and addressed.  No physical exam was performed (except for noted visual exam findings with Video Visits).  Please refer to the patient's chart (MyChart message for video visits and phone note for telephone visits) for the patient's consent to telehealth for [place] [facility]  Date:  07/07/2020   ID:  Kathy Hobbs, DOB Oct 24, 1940, MRN [id number]  Patient Location:  [address] [place] [address]   Provider location:     [place] [address] Office [phone] Fax [phone]   PCP:  Ma Hillock, DO  Cardiologist:  Pixie Casino, MD  Electrophysiologist:  None   Chief Complaint: Follow-up evaluation for atrial fibrillation  History of Present Illness:    Kathy Hobbs is a 80 y.o. female who presents via  audio/video conferencing for a telehealth visit today.  Patient verified DOB and address.  She has a PMH of asymptomatic permanent atrial fibrillation, chronically anticoagulated with Coumadin.  Hypertension is managed with irbesartan and spironolactone, carotid artery stenosis with 40% right carotid artery stenosis by Doppler in 2016.  She denied history of stroke or TIA.    Her PMH also includes PAD and LDA peripheral stents as well as bilateral renal artery PCI in [place].  Dopplers in 2017 showed patent renal arteries and normal ABIs.  Carotid Dopplers in August 2019 with 1 to 39% bilateral ICA stenosis.  She was seen by Kerin Ransom, PA-C on 12/28/2018 via telehealth visit.  She and her husband were in [place] during the first phase of the pandemic and were social distancing.   She presented to the clinic 05/19/2019 and stated she felt well.  She stated she had been less active with the COVID-19 virus.  She had some shortness of breath with increased physical activity but no shortness of breath with normal activities.  She stated her and her husband stayed in [place] through the fall of 2020 to avoid the COVID-19 virus but had returned home to [place] for the Christmas holidays.  They ended up driving from her home in [place] back to [place] to avoid flying.  They planned to return to [place] in the spring.   I  had a BMP and lipid panel drawn as well as blood pressure log.  BMP was stable.  Slightly elevated creatinine 1.21 which was down from previous 1.25.  She presents the clinic today for follow-up evaluation and  states she feels well.  She continues to stay physically active at her house in [place].  She reports that they did not keep salt in their house and cook most of their meals.  Her blood pressure when she reviewed it recently was 130/80 and stays in that range.  I will give her the salty 6 diet sheet, have her increase her physical activity as tolerated and follow-up in 12  months.  Today she denies chest pain, shortness of breath, lower extremity edema, fatigue, palpitations, melena, hematuria, hemoptysis, diaphoresis, weakness, presyncope, syncope, orthopnea, and PND.   The patient does not symptoms concerning for COVID-19 infection (fever, chills, cough, or new SHORTNESS OF BREATH).    Prior CV studies:   The following studies were reviewed today:  [place] artery dopplers 01/07/18: 1-39% B ICA stenosis  Renal artery dopplers 05/22/16: Patent renal artery stents - repeat renal dopplers in 1 year.  Past Medical History:  Diagnosis Date  . Atrial fibrillation ([place])   . Breast cancer ([place])   . Depression   . Hyperlipidemia   . Hypertension   . Right humeral fracture 2016  . Skin cancer of nose    Unknown type, patient reports needed graft  . Urine incontinence   . Vertigo    Past Surgical History:  Procedure Laterality Date  . BREAST BIOPSY    . BREAST LUMPECTOMY    . FEMORAL ARTERY STENT     ? pt states "Stent in my leg"  . RENAL ARTERY STENT Bilateral      Current Meds  Medication Sig  . atorvastatin (LIPITOR) 80 MG tablet TAKE 1 TABLET BY MOUTH EVERY DAY  . DULoxetine (CYMBALTA) 20 MG capsule Take 1 capsule (20 mg total) by mouth daily.  . irbesartan (AVAPRO) 300 MG tablet Take 1 tablet by mouth daily for blood pressure.  No refills until seen by MD  . Iris Pert Aspart-Potassium Aspart (RA POTASSIUM/MAGNESIUM PO) Take by mouth.  . sertraline (ZOLOFT) 50 MG tablet Take 1 tablet (50 mg total) by mouth daily. (Patient taking differently: Take 25 mg by mouth daily.)  . spironolactone (ALDACTONE) 25 MG tablet TAKE 1/2 TABLET BY MOUTH EVERY DAY  . warfarin (COUMADIN) 2 MG tablet TAKE 1/2 TO 1 TABLET BY MOUTH DAILY AS DIRECTED     Allergies:   Patient has no known allergies.   Social History   Tobacco Use  . Smoking status: Never Smoker  . Smokeless tobacco: Never Used  Vaping Use  . Vaping Use: Never used  Substance Use Topics  .  Alcohol use: No    Alcohol/week: 0.0 standard drinks  . Drug use: No     Family Hx: The patient's family history includes Alcohol abuse in her brother; Alcoholism in her brother; Breast cancer in her paternal grandmother; Depression in her brother and mother; Heart attack in her father.  ROS:   Please see the history of present illness.     All other systems reviewed and are negative.   Labs/Other Tests and Data Reviewed:    Recent Labs: 10/22/2019: ALT 34; BUN 22; Creat 1.21; Hemoglobin 13.6; Platelets 160; Potassium 4.6; Sodium 140; TSH 3.47   Recent Lipid Panel Lab Results  Component Value Date/Time   CHOL 140 06/27/2016 10:08 AM   TRIG 93 06/27/2016 10:08 AM   HDL 54 06/27/2016 10:08 AM   CHOLHDL 2.6 06/27/2016 10:08 AM   LDLCALC 67 06/27/2016 10:08 AM    Wt Readings from Last 3 Encounters:  07/07/20 169 lb  14.4 oz (77.1 kg)  01/31/20 168 lb (76.2 kg)  10/22/19 169 lb (76.7 kg)     Exam:    Vital Signs:  Wt 169 lb 14.4 oz (77.1 kg)   BMI 27.42 kg/m    Well nourished, well developed female in no  acute distress.   ASSESSMENT & PLAN:    1.  Permanent atrial fibrillation-unable to check her pulse. Denies palpitations or flutters with her atrial fibrillation.  Denies bleeding issues. INR  managed by [facility] - This patients CHA2DS2-VASc Score and unadjusted Ischemic Stroke Rate (% per year) is equal to 7.2 % stroke rate/year from a score of 5 (age2, female, HTN, vascular) Continue Coumadin 2 mg tablet as directed  Essential hypertension-unavailable BP today.  Well-controlled at home with checks are local grocery store.  130s over 80s Continue Avapro 300 mg tablet daily Continue Spironolactone 12.5 mg tablet daily Heart healthy low-sodium diet Increase physical activity as tolerated Evaluate blood pressure with INR check  Renal artery stenosis- status post bilateral stents -patent by Dopplers in 2017 BP stable  Request labs from PCP  PAD-denies lower  extremity pain/claudication. -Prior LE a peripheral stents in [place] -2017 ABIs 1.3-1.2    CKD stage III-creatinine 1.21 on 10/22/2019 Request labs from PCP  Hyperlipidemia-LDL 67 on 06/27/2016 -Continue atorvastatin 80 mg tablet daily Heart healthy low-sodium high-fiber diet Order lipid panel to be drawn with next INR check  Disposition: Follow-up with Dr. Debara Pickett in 12 months.  COVID-19 Education: The signs and symptoms of COVID-19 were discussed with the patient and how to seek care for testing (follow up with PCP or arrange E-visit).  The importance of social distancing was discussed today.  Patient Risk:   After full review of this patients clinical status, I feel that they are at least moderate risk at this time.  Time:   Today, I have spent 10 minutes with the patient with telehealth technology discussing diet, medications, exercise, blood pressure. I spent greater 20 minutes prior to the visit reviewing her past medical history, cardiac tests and medications.   Medication Adjustments/Labs and Tests Ordered: Current medicines are reviewed at length with the patient today.  Concerns regarding medicines are outlined above.   Tests Ordered: No orders of the defined types were placed in this encounter.  Medication Changes: No orders of the defined types were placed in this encounter.   Disposition:  in 1 year(s)  Signed, Jossie Ng. Fredonia Casalino NP-C    01/05/2019 11:58 AM    [place] [address] Office [phone] Fax [phone]

## 2020-07-07 ENCOUNTER — Telehealth (INDEPENDENT_AMBULATORY_CARE_PROVIDER_SITE_OTHER): Payer: Medicare Other | Admitting: General Practice

## 2020-07-07 ENCOUNTER — Encounter: Payer: Self-pay | Admitting: General Practice

## 2020-07-07 VITALS — Wt 169.9 lb

## 2020-07-07 DIAGNOSIS — I10 Essential (primary) hypertension: Secondary | ICD-10-CM | POA: Diagnosis not present

## 2020-07-07 DIAGNOSIS — I701 Atherosclerosis of renal artery: Secondary | ICD-10-CM | POA: Diagnosis not present

## 2020-07-07 DIAGNOSIS — N183 Chronic kidney disease, stage 3 unspecified: Secondary | ICD-10-CM

## 2020-07-07 DIAGNOSIS — E785 Hyperlipidemia, unspecified: Secondary | ICD-10-CM

## 2020-07-07 DIAGNOSIS — Z7189 Other specified counseling: Secondary | ICD-10-CM

## 2020-07-07 DIAGNOSIS — I739 Peripheral vascular disease, unspecified: Secondary | ICD-10-CM

## 2020-07-07 DIAGNOSIS — I4819 Other persistent atrial fibrillation: Secondary | ICD-10-CM | POA: Diagnosis not present

## 2020-07-07 NOTE — Patient Instructions (Signed)
Medication Instructions:  The current medical regimen is effective;  continue present plan and medications as directed. Please refer to the Current Medication list given to you today.  *If you need a refill on your cardiac medications before your next appointment, please call your pharmacy*  Lab Work:   Testing/Procedures:  NONE    NONE  Special Instructions  PLEASE READ AND FOLLOW SALTY 6-ATTACHED-1,800mg  daily  Follow-Up: Your next appointment:  12 month(s) In Person with K. Mali Hilty, MD OR IF UNAVAILABLE JESSE CLEAVER, FNP-C   Please call our office 2 months in advance to schedule this appointment   At [facility], you and your health needs are our priority.  As part of our continuing mission to provide you with exceptional heart care, we have created designated Provider Care Teams.  These Care Teams include your primary Cardiologist (physician) and Advanced Practice Providers (APPs -  Physician Assistants and Nurse Practitioners) who all work together to provide you with the care you need, when you need it.  We recommend signing up for the patient portal called "MyChart".  Sign up information is provided on this After Visit Summary.  MyChart is used to connect with patients for Virtual Visits (Telemedicine).  Patients are able to view lab/test results, encounter notes, upcoming appointments, etc.  Non-urgent messages can be sent to your provider as well.   To learn more about what you can do with MyChart, go to [email or web address].              6 SALTY THINGS TO AVOID     1,800MG  DAILY

## 2020-07-23 ENCOUNTER — Encounter: Payer: Self-pay | Admitting: Family Medicine

## 2020-07-24 NOTE — Telephone Encounter (Signed)
Patient daughter, Joseph Art [facility]) called back after patient read reply message from Dr. Raoul Pitch clinical staff.  Patient is in [place] until April 11th.  Patient daughter aware Dr. Raoul Pitch is out of office until tomorrow.  Can she call enough meds until she returns from [place]? Patient can do virtual appt, not sure if Dr.Kuneff  is able to do while patient is in [place], out of state lines.  Please advise and call daughter Joseph Art tomorrow. Joseph Art can be reached at [phone].

## 2020-07-25 MED ORDER — DULOXETINE HCL 20 MG PO CPEP: 20.0000 mg | ORAL_CAPSULE | Freq: Every day | ORAL | 0 refills | Status: DC

## 2020-07-25 NOTE — Telephone Encounter (Signed)
Please inform pt I have refilled her cymbalta for 90 days. Must have appt as soon as she gets back to [place].  Thanks.

## 2020-07-28 ENCOUNTER — Other Ambulatory Visit: Payer: Self-pay | Admitting: Internal Medicine

## 2020-07-28 LAB — POCT INR: INR: 2 (ref 2.0–3.0)

## 2020-07-29 LAB — PROTIME-INR
INR: 2 — ABNORMAL HIGH (ref 0.9–1.2)
Prothrombin Time: 20.6 s — ABNORMAL HIGH (ref 9.1–12.0)

## 2020-07-31 ENCOUNTER — Ambulatory Visit (INDEPENDENT_AMBULATORY_CARE_PROVIDER_SITE_OTHER): Payer: Medicare Other | Admitting: Internal Medicine

## 2020-07-31 DIAGNOSIS — Z7901 Long term (current) use of anticoagulants: Secondary | ICD-10-CM

## 2020-07-31 MED ORDER — IRBESARTAN 300 MG PO TABS: ORAL_TABLET | ORAL | 0 refills | Status: DC

## 2020-09-13 ENCOUNTER — Encounter: Payer: Self-pay | Admitting: Family Medicine

## 2020-09-13 ENCOUNTER — Ambulatory Visit (INDEPENDENT_AMBULATORY_CARE_PROVIDER_SITE_OTHER): Payer: Medicare Other | Admitting: Family Medicine

## 2020-09-13 ENCOUNTER — Other Ambulatory Visit: Payer: Self-pay

## 2020-09-13 VITALS — BP 119/81 | HR 82 | Temp 98.4°F | Ht 65.0 in | Wt 174.0 lb

## 2020-09-13 DIAGNOSIS — E785 Hyperlipidemia, unspecified: Secondary | ICD-10-CM

## 2020-09-13 DIAGNOSIS — F339 Major depressive disorder, recurrent, unspecified: Secondary | ICD-10-CM | POA: Diagnosis not present

## 2020-09-13 DIAGNOSIS — I739 Peripheral vascular disease, unspecified: Secondary | ICD-10-CM

## 2020-09-13 DIAGNOSIS — N183 Chronic kidney disease, stage 3 unspecified: Secondary | ICD-10-CM

## 2020-09-13 DIAGNOSIS — M810 Age-related osteoporosis without current pathological fracture: Secondary | ICD-10-CM

## 2020-09-13 DIAGNOSIS — Z7901 Long term (current) use of anticoagulants: Secondary | ICD-10-CM

## 2020-09-13 DIAGNOSIS — R7309 Other abnormal glucose: Secondary | ICD-10-CM

## 2020-09-13 DIAGNOSIS — I4891 Unspecified atrial fibrillation: Secondary | ICD-10-CM | POA: Diagnosis not present

## 2020-09-13 DIAGNOSIS — I10 Essential (primary) hypertension: Secondary | ICD-10-CM | POA: Diagnosis not present

## 2020-09-13 DIAGNOSIS — E559 Vitamin D deficiency, unspecified: Secondary | ICD-10-CM

## 2020-09-13 LAB — CBC
HCT: 40.4 % (ref 36.0–46.0)
Hemoglobin: 13.6 g/dL (ref 12.0–15.0)
MCHC: 33.6 g/dL (ref 30.0–36.0)
MCV: 91.9 fl (ref 78.0–100.0)
Platelets: 170 10*3/uL (ref 150.0–400.0)
RBC: 4.39 Mil/uL (ref 3.87–5.11)
RDW: 14.7 % (ref 11.5–15.5)
WBC: 4.6 10*3/uL (ref 4.0–10.5)

## 2020-09-13 LAB — LIPID PANEL
Cholesterol: 141 mg/dL (ref 0–200)
HDL: 57.5 mg/dL (ref >39.00)
LDL Cholesterol: 69 mg/dL (ref 0–99)
NonHDL: 83.82
Total CHOL/HDL Ratio: 2
Triglycerides: 76 mg/dL (ref 0.0–149.0)
VLDL: 15.2 mg/dL (ref 0.0–40.0)

## 2020-09-13 LAB — COMPREHENSIVE METABOLIC PANEL
ALT: 36 U/L — ABNORMAL HIGH (ref 0–35)
AST: 37 U/L (ref 0–37)
Albumin: 4 g/dL (ref 3.5–5.2)
Alkaline Phosphatase: 75 U/L (ref 39–117)
BUN: 32 mg/dL — ABNORMAL HIGH (ref 6–23)
CO2: 25 mEq/L (ref 19–32)
Calcium: 9.7 mg/dL (ref 8.4–10.5)
Chloride: 105 mEq/L (ref 96–112)
Creatinine, Ser: 1.29 mg/dL — ABNORMAL HIGH (ref 0.40–1.20)
GFR: 39.64 mL/min — ABNORMAL LOW (ref >60.00)
Glucose, Bld: 84 mg/dL (ref 70–99)
Potassium: 4.3 mEq/L (ref 3.5–5.1)
Sodium: 139 mEq/L (ref 135–145)
Total Bilirubin: 0.9 mg/dL (ref 0.2–1.2)
Total Protein: 7.2 g/dL (ref 6.0–8.3)

## 2020-09-13 LAB — HEMOGLOBIN A1C: Hgb A1c MFr Bld: 6.2 % (ref 4.6–6.5)

## 2020-09-13 LAB — TSH: TSH: 2.84 u[IU]/mL (ref 0.35–4.50)

## 2020-09-13 LAB — VITAMIN D 25 HYDROXY (VIT D DEFICIENCY, FRACTURES): VITD: 66.54 ng/mL (ref 30.00–100.00)

## 2020-09-13 MED ORDER — SERTRALINE HCL 25 MG PO TABS: 25.0000 mg | ORAL_TABLET | Freq: Every day | ORAL | 1 refills | Status: DC

## 2020-09-13 MED ORDER — DULOXETINE HCL 20 MG PO CPEP: 20.0000 mg | ORAL_CAPSULE | Freq: Every day | ORAL | 1 refills | Status: DC

## 2020-09-13 NOTE — Patient Instructions (Addendum)
    Great to see you today.  Welcome back to [facility].  We will call you with all lab results.  I called in your scripts we provide.

## 2020-09-13 NOTE — Progress Notes (Signed)
This visit occurred during the SARS-CoV-2 public health emergency.  Safety protocols were in place, including screening questions prior to the visit, additional usage of staff PPE, and extensive cleaning of exam room while observing appropriate contact time as indicated for disinfecting solutions.    Kathy Hobbs , 08-20-1940, 80 y.o., female MRN: [id number] Patient Care Team    Relationship Specialty Notifications Start End  Ma Hillock, DO PCP - General Family Medicine  02/28/15   Pixie Casino, MD PCP - Cardiology Cardiology Admissions 12/25/17     Chief Complaint  Patient presents with  . Follow-up    [facility]; pt is not fasting     Subjective: Pt presents for an OV for [facility] Episode of recurrent major depressive disorder, unspecified depression episode severity ([place]) Pt reports she is doing well. She is compliant with cymblata and zoloft, but she is only taking zoloft 25 mg qd.   Essential hypertension, benign Managed by cardiology  Depression screen [facility] 2/9 01/31/2020 10/22/2019 03/10/2019 08/28/2018 02/10/2018  Decreased Interest 0 0 0 0 0  Down, Depressed, Hopeless 0 0 0 0 0  PHQ - 2 Score 0 0 0 0 0  Altered sleeping 0 0 0 0 0  Tired, decreased energy 0 3 0 0 0  Change in appetite 0 0 0 0 0  Feeling bad or failure about yourself  0 0 0 0 0  Trouble concentrating 0 0 0 0 0  Moving slowly or fidgety/restless 0 1 0 0 0  Suicidal thoughts 0 0 0 0 0  PHQ-9 Score 0 4 0 0 0  Difficult doing work/chores - Not difficult at all Not difficult at all Not difficult at all Not difficult at all    No Known Allergies Social History   Social History Narrative   Married. Currently lives with daughter in [place] from [place] secondary to declining health.    Wears seatbelt, wears bike helmet.    - does not exercise daily.    - takes vitamins.    - Smoke alarm in the home. Guns in the home, locked case.   - feels safe in her relationships.       Epworth Sleepiness Scale =  10 (04/04/2015)      Homemaker      Highest level of education:  High school   Past Medical History:  Diagnosis Date  . Atrial fibrillation ([place])   . Breast cancer ([place])   . Depression   . Hyperlipidemia   . Hypertension   . Right humeral fracture 2016  . Skin cancer of nose    Unknown type, patient reports needed graft  . Urine incontinence   . Vertigo    Past Surgical History:  Procedure Laterality Date  . BREAST BIOPSY    . BREAST LUMPECTOMY    . FEMORAL ARTERY STENT     ? pt states "Stent in my leg"  . RENAL ARTERY STENT Bilateral    Family History  Problem Relation Age of Onset  . Heart attack Father   . Depression Mother   . Alcohol abuse Brother   . Breast cancer Paternal Grandmother   . Alcoholism Brother   . Depression Brother    Allergies as of 09/13/2020   No Known Allergies     Medication List       Accurate as of September 13, 2020  1:13 PM. If you have any questions, ask your nurse or doctor.  atorvastatin 80 MG tablet Commonly known as: LIPITOR TAKE 1 TABLET BY MOUTH EVERY DAY   cholecalciferol 1000 units tablet Commonly known as: VITAMIN D Take 1,000 Units by mouth daily.   DULoxetine 20 MG capsule Commonly known as: Cymbalta Take 1 capsule (20 mg total) by mouth daily.   irbesartan 300 MG tablet Commonly known as: AVAPRO Take 1 tablet by mouth daily for blood pressure.  No refills until seen by MD   RA POTASSIUM/MAGNESIUM PO Take by mouth.   sertraline 50 MG tablet Commonly known as: ZOLOFT Take 1 tablet (50 mg total) by mouth daily. What changed: how much to take   spironolactone 25 MG tablet Commonly known as: ALDACTONE TAKE 1/2 TABLET BY MOUTH EVERY DAY   warfarin 2 MG tablet Commonly known as: COUMADIN Take as directed by the anticoagulation clinic. If you are unsure how to take this medication, talk to your nurse or doctor. Original instructions: TAKE 1/2 TO 1 TABLET BY MOUTH DAILY AS DIRECTED       All past  medical history, surgical history, allergies, family history, immunizations andmedications were updated in the EMR today and reviewed under the history and medication portions of their EMR.     ROS: Negative, with the exception of above mentioned in HPI   Objective:  BP 119/81   Pulse 82   Temp 98.4 F (36.9 C) (Oral)   Ht $R'5\' 5"'zN$  (1.651 m)   Wt 174 lb (78.9 kg)   SpO2 98%   BMI 28.96 kg/m  Body mass index is 28.96 kg/m. Gen: Afebrile. No acute distress. Nontoxic in appearance, well developed, well nourished.  HENT: AT. [place].  Eyes:Pupils Equal Round Reactive to light, Extraocular movements intact,  Conjunctiva without redness, discharge or icterus. Neck/lymp/endocrine: Supple,no lymphadenopathy, no thyromegaly.  CV: irreg irreg. No edema.  Chest: CTAB, no wheeze or crackles. Good air movement, normal resp effort.  Abd: Soft. NTND. BS present Neuro: Normal gait. PERLA. EOMi. Alert. Oriented x3  Psych: Normal affect, dress and demeanor. Normal speech. Normal thought content and judgment.  No exam data present No results found. No results found for this or any previous visit (from the past 24 hour(s)).  Assessment/Plan: Kathy Hobbs is a 80 y.o. female present for OV for  Depression, unspecified depression type - stable  - continue  zoloft 25 mg QD and cymbalta  20 mg Qd. - f/u 5.5 months.  Essential hypertension, benign/Atrial fibrillation, unspecified type (HCC)/Dyslipidemia/ Long term (current) use of anticoagulants [Z79.01]/PAD (peripheral artery disease) (HCC) medications managed by cardiology - Comp Met (CMET) - TSH - CBC - Lipid panel  Stage 3 chronic kidney disease, unspecified whether stage 3a or 3b CKD (HCC)/Vitamin D deficiency/Osteoporosis, unspecified osteoporosis type, unspecified pathological fracture presence Renal dose meds when appropriate Avoid nsaids.  - Comp Met (CMET) - Vitamin D (25 hydroxy)  Elevated hemoglobin A1C - Hemoglobin  A1c   Reviewed expectations re: course of current medical issues.  Discussed self-management of symptoms.  Outlined signs and symptoms indicating need for more acute intervention.  Patient verbalized understanding and all questions were answered.  Patient received an After-Visit Summary.    No orders of the defined types were placed in this encounter.  No orders of the defined types were placed in this encounter.  Referral Orders  No referral(s) requested today     Note is dictated utilizing voice recognition software. Although note has been proof read prior to signing, occasional typographical errors still can be missed. If any questions  arise, please do not hesitate to call for verification.   electronically signed by:  Howard Pouch, DO  [place]

## 2020-10-11 ENCOUNTER — Other Ambulatory Visit: Payer: Self-pay

## 2020-10-11 ENCOUNTER — Ambulatory Visit (INDEPENDENT_AMBULATORY_CARE_PROVIDER_SITE_OTHER): Payer: Medicare Other

## 2020-10-11 DIAGNOSIS — Z7901 Long term (current) use of anticoagulants: Secondary | ICD-10-CM | POA: Diagnosis not present

## 2020-10-11 DIAGNOSIS — I4891 Unspecified atrial fibrillation: Secondary | ICD-10-CM

## 2020-10-11 LAB — POCT INR: INR: 2.2 (ref 2.0–3.0)

## 2020-10-11 NOTE — Patient Instructions (Signed)
Continue taking warfarin to 1mg  (1/2 tablet daily) except for 2mg  Monday, Wednesday and Friday. Repeat INR in 6 weeks.   (patient now living in [place]).

## 2020-11-13 ENCOUNTER — Telehealth: Payer: Self-pay | Admitting: General Practice

## 2020-11-13 NOTE — Telephone Encounter (Signed)
*  STAT* If patient is at the pharmacy, call can be transferred to refill team.   1. Which medications need to be refilled? (please list name of each medication and dose if known)  irbesartan (AVAPRO) 300 MG tablet  2. Which pharmacy/location (including street and city if local pharmacy) is medication to be sent to? [place], [address]       3. Do they need a 30 day or 90 day supply? 90  Patient is out of medication

## 2020-11-14 ENCOUNTER — Other Ambulatory Visit: Payer: Self-pay | Admitting: Pharmacist

## 2020-11-14 MED ORDER — IRBESARTAN 300 MG PO TABS: ORAL_TABLET | ORAL | 1 refills | Status: DC

## 2020-11-16 NOTE — Telephone Encounter (Signed)
Patient had telemedicine visit with Coletta Memos 07/07/20.

## 2020-11-23 ENCOUNTER — Telehealth: Payer: Self-pay | Admitting: Internal Medicine

## 2020-11-23 DIAGNOSIS — E785 Hyperlipidemia, unspecified: Secondary | ICD-10-CM

## 2020-11-23 MED ORDER — ATORVASTATIN CALCIUM 80 MG PO TABS: 1.0000 | ORAL_TABLET | Freq: Every day | ORAL | 3 refills | Status: DC

## 2020-11-23 NOTE — Telephone Encounter (Signed)
*  STAT* If patient is at the pharmacy, call can be transferred to refill team.   1. Which medications need to be refilled? (please list name of each medication and dose if known) atorvastatin (LIPITOR) 80 MG tablet  2. Which pharmacy/location (including street and city if local pharmacy) is medication to be sent to? [facility] [id number] - [place], [place]  3. Do they need a 30 day or 90 day supply? [place]

## 2020-11-23 NOTE — Telephone Encounter (Signed)
Called and spoke w/pts daughter and instructed her to have the pt call us when possible to clarify coumadin dosing and get an appt. The pt's daughter indicated that the pt has fallen and may have her phone turned off but that she will get incontact w/her

## 2020-11-23 NOTE — Telephone Encounter (Signed)
I attempted to contact both numbers but was unable to reach anyone. Will route to Coumadin to see if they can help.

## 2020-11-23 NOTE — Telephone Encounter (Signed)
Pt is needing to know how Dr. Debara Pickett is wanting her to take her Coumadin due to her missing her appt... please advise

## 2020-11-23 NOTE — Telephone Encounter (Signed)
Medication sent to pharmacy  

## 2020-11-27 ENCOUNTER — Ambulatory Visit (INDEPENDENT_AMBULATORY_CARE_PROVIDER_SITE_OTHER): Payer: Medicare Other | Admitting: Pharmacist Clinician (PhC)/ Clinical Pharmacy Specialist

## 2020-11-27 ENCOUNTER — Other Ambulatory Visit: Payer: Self-pay

## 2020-11-27 DIAGNOSIS — Z7901 Long term (current) use of anticoagulants: Secondary | ICD-10-CM | POA: Diagnosis not present

## 2020-11-27 DIAGNOSIS — I4891 Unspecified atrial fibrillation: Secondary | ICD-10-CM | POA: Diagnosis not present

## 2020-11-27 DIAGNOSIS — E785 Hyperlipidemia, unspecified: Secondary | ICD-10-CM

## 2020-11-27 LAB — POCT INR: INR: 3.9 — AB (ref 2.0–3.0)

## 2020-11-27 MED ORDER — ATORVASTATIN CALCIUM 80 MG PO TABS: 80.0000 mg | ORAL_TABLET | Freq: Every day | ORAL | 3 refills | Status: DC

## 2020-11-27 MED ORDER — WARFARIN SODIUM 2 MG PO TABS: ORAL_TABLET | ORAL | 0 refills | Status: DC

## 2020-11-29 ENCOUNTER — Ambulatory Visit (INDEPENDENT_AMBULATORY_CARE_PROVIDER_SITE_OTHER): Payer: Medicare Other | Admitting: *Deleted

## 2020-11-29 DIAGNOSIS — Z0000 Encounter for general adult medical examination without abnormal findings: Secondary | ICD-10-CM

## 2020-11-29 NOTE — Progress Notes (Signed)
Subjective:   Kathy Hobbs is a 80 y.o. female who presents for Medicare Annual (Subsequent) preventive examination.  I connected with  [name] on 11/29/20 by a video enabled telemedicine application and verified that I am speaking with the correct person using two identifiers.   I discussed the limitations of evaluation and management by telemedicine. The patient expressed understanding and agreed to proceed.  Patient location: home  Provider location:  [place]     Review of Systems    NA Cardiac Risk Factors include: advanced age (>42men, >85 women);dyslipidemia;hypertension;family history of premature cardiovascular disease     Objective:    Today's Vitals   There is no height or weight on file to calculate BMI.  Advanced Directives 11/29/2020 01/02/2017  Does Patient Have a Medical Advance Directive? Yes Yes  Type of Advance Directive Living will [place];Living will  Copy of Hydro in Chart? - No - copy requested    Current Medications (verified) Outpatient Encounter Medications as of 11/29/2020  Medication Sig   atorvastatin (LIPITOR) 80 MG tablet Take 1 tablet (80 mg total) by mouth daily.   cholecalciferol (VITAMIN D) 1000 units tablet Take 1,000 Units by mouth daily.   DULoxetine (CYMBALTA) 20 MG capsule Take 1 capsule (20 mg total) by mouth daily.   irbesartan (AVAPRO) 300 MG tablet Take 1 tablet by mouth daily for blood pressure.  No refills until seen by MD   Mag Aspart-Potassium Aspart (RA POTASSIUM/MAGNESIUM PO) Take by mouth.   sertraline (ZOLOFT) 25 MG tablet Take 1 tablet (25 mg total) by mouth daily.   sertraline (ZOLOFT) 50 MG tablet Take 1 tablet (50 mg total) by mouth daily.   spironolactone (ALDACTONE) 25 MG tablet TAKE 1/2 TABLET BY MOUTH EVERY DAY   warfarin (COUMADIN) 2 MG tablet TAKE 1/2 TO 1 TABLET BY MOUTH DAILY AS DIRECTED   No facility-administered encounter medications on file as  of 11/29/2020.    Allergies (verified) Patient has no known allergies.   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 pt states "Stent in my leg"   RENAL ARTERY STENT Bilateral    Family History  Problem Relation Age of Onset   Heart attack Father    Depression Mother    Alcohol abuse Brother    Breast cancer Paternal Grandmother    Alcoholism Brother    Depression Brother    Social History   Socioeconomic History   Marital status: Married    Spouse name: Not on file   Number of children: Not on file   Years of education: Not on file   Highest education level: Not on file  Occupational History   Not on file  Tobacco Use   Smoking status: Never   Smokeless tobacco: Never  Vaping Use   Vaping Use: Never used  Substance and Sexual Activity   Alcohol use: No    Alcohol/week: 0.0 standard drinks   Drug use: No   Sexual activity: Never  Other Topics Concern   Not on file  Social History Narrative   Married. Currently lives with daughter in [place] from [place] secondary to declining health.    Wears seatbelt, wears bike helmet.    - does not exercise daily.    - takes vitamins.    - Smoke alarm in the home. Guns in the home, locked case.   - feels safe in her relationships.       Epworth Sleepiness Scale = 10 (04/04/2015)      Homemaker      Highest level of education:  High school   Social Determinants of Health   Financial Resource Strain: Low Risk    Difficulty of Paying Living Expenses: Not hard at all  Food Insecurity: No Food Insecurity   Worried About Charity fundraiser in the Last Year:  Never true   Arboriculturist in the Last Year: Never true  Transportation Needs: Not on file  Physical Activity: Not on file  Stress: No Stress Concern Present   Feeling of Stress : Not at all  Social Connections: Moderately Isolated   Frequency of Communication with Friends and Family: Three times a week   Frequency of Social Gatherings with Friends and Family: Three times a week   Attends Religious Services: Never   Active Member of Clubs or Organizations: No   Attends Music therapist: Never   Marital Status: Married    Tobacco Counseling Counseling given: Not Answered   Clinical Intake:  Pre-visit preparation completed: Yes  Pain : No/denies pain     Nutritional Risks: None Diabetes: No  How often do you need to have someone help you when you read instructions, pamphlets, or other written materials from your doctor or pharmacy?: 1 - Never  Diabetic?no  Interpreter Needed?: No  Information entered by :: Leroy Kennedy LPN   Activities of Daily Living In your present state of health, do you have any difficulty performing the following activities: 11/29/2020 01/31/2020  Hearing? Tempie Donning  Vision? N N  Difficulty concentrating or making decisions? N N  Walking or climbing stairs? N N  Dressing or bathing? N N  Doing errands, shopping? N N  Preparing Food and eating ? N -  Using the Toilet? N -  In the past six months, have you accidently leaked urine? N -  Do you have problems with loss of bowel control? N -  Managing your Medications? N -  Managing your Finances? N -  Housekeeping or managing your Housekeeping? N -  Some recent data might be hidden    Patient Care Team: Ma Hillock, DO as PCP - General (Family Medicine) Debara Pickett Nadean Corwin, MD as PCP - Cardiology (Cardiology)  Indicate any recent Medical Services you may have received from other than [facility] providers in the past year (date may be approximate).     Assessment:   This is a routine  wellness examination for Kathy Hobbs.  Hearing/Vision screen Hearing Screening - Comments:: Little hearing loss Had audiology appointment   no hearing aids Vision Screening - Comments:: Up to Date  [facility]  Dietary issues and exercise activities discussed: Current Exercise Habits: Home exercise routine, Type of exercise: walking, Time (Minutes): 40, Frequency (Times/Week): 5, Weekly Exercise (Minutes/Week): 200, Intensity: Moderate   Goals Addressed             This Visit's Progress    Patient Stated       Maintain current lifestyle  Depression Screen PHQ 2/9 Scores 01/31/2020 10/22/2019 03/10/2019 08/28/2018 02/10/2018 08/08/2017 07/24/2016  PHQ - 2 Score 0 0 0 0 0 4 0  PHQ- 9 Score 0 4 0 0 0 18 -    Fall Risk Fall Risk  11/29/2020 09/13/2020 02/10/2018 12/31/2017 07/24/2016  Falls in the past year? 1 0 No Yes No  Comment - - - Emmi Telephone Survey: data to providers prior to load -  Number falls in past yr: 0 0 - 2 or more -  Comment - - - Emmi Telephone Survey Actual Response = 3 -  Injury with Fall? 0 0 - No -  Risk Factor Category  - - - - -  Risk for fall due to : - - - - -  Follow up Falls evaluation completed;Falls prevention discussed Falls evaluation completed - - -    FALL RISK PREVENTION PERTAINING TO THE HOME:  Any stairs in or around the home? Yes  If so, are there any without handrails? Yes  Home free of loose throw rugs in walkways, pet beds, electrical cords, etc? Yes  Adequate lighting in your home to reduce risk of falls? Yes   ASSISTIVE DEVICES UTILIZED TO PREVENT FALLS:  Life alert? No  Use of a cane, walker or w/c? No  Grab bars in the bathroom? Yes  Shower chair or bench in shower? Yes  Elevated toilet seat or a handicapped toilet? No   TIMED UP AND GO:  Was the test performed? No .   Tele health visit    Cognitive Function:  Normal cognitive status assessed by direct observation by this Nurse Health Advisor. No abnormalities  found.          Immunizations Immunization History  Administered Date(s) Administered   Influenza,inj,Quad PF,6+ Mos 04/13/2015   Influenza-Unspecified 01/22/2018   PFIZER(Purple Top)SARS-COV-2 Vaccination 07/05/2019, 07/26/2019, 05/08/2020   Tdap 07/13/2015    TDAP status: Up to date  Flu Vaccine status: Due, Education has been provided regarding the importance of this vaccine. Advised may receive this vaccine at local pharmacy or Health Dept. Aware to provide a copy of the vaccination record if obtained from local pharmacy or Health Dept. Verbalized acceptance and understanding.  Pneumococcal vaccine status: Declined,  Education has been provided regarding the importance of this vaccine but patient still declined. Advised may receive this vaccine at local pharmacy or Health Dept. Aware to provide a copy of the vaccination record if obtained from local pharmacy or Health Dept. Verbalized acceptance and understanding.   Covid-19 vaccine status: Completed vaccines  Qualifies for Shingles Vaccine? Yes   Zostavax completed No   Shingrix Completed?: No.    Education has been provided regarding the importance of this vaccine. Patient has been advised to call insurance company to determine out of pocket expense if they have not yet received this vaccine. Advised may also receive vaccine at local pharmacy or Health Dept. Verbalized acceptance and understanding.  Screening Tests Health Maintenance  Topic Date Due   Zoster Vaccines- Shingrix (1 of 2) Never done   COVID-19 Vaccine (4 - Booster for Pfizer series) 09/06/2020   Hepatitis C Screening  09/13/2021 (Originally 10/16/1958)   PNA vac Low Risk Adult (1 of 2 - PCV13) 09/13/2021 (Originally 10/16/2006)   INFLUENZA VACCINE  01/01/2021   TETANUS/TDAP  07/12/2025   DEXA SCAN  Completed   HPV VACCINES  Aged Out    Health Maintenance  Health Maintenance Due  Topic Date Due   Zoster Vaccines- Shingrix (1  of 2) Never done   COVID-19  Vaccine (4 - Booster for Pfizer series) 09/06/2020    Colorectal cancer screening: No longer required.   Mammogram status: No longer required due to  .  Bone Density will think about  Lung Cancer Screening: (Low Dose CT Chest recommended if Age 67-80 years, 30 pack-year currently smoking OR have quit w/in 15years.) does not qualify.   Lung Cancer Screening Referral: Na  Additional Screening:  Hepatitis C Screening: does not qualify  Vision Screening: Recommended annual ophthalmology exams for early detection of glaucoma and other disorders of the eye. Is the patient up to date with their annual eye exam?  Yes  Who is the provider or what is the name of the office in which the patient attends annual eye exams? [facility] If pt is not established with a provider, would they like to be referred to a provider to establish care?  established .   Dental Screening: Recommended annual dental exams for proper oral hygiene  Community Resource Referral / Chronic Care Management: CRR required this visit?  No   CCM required this visit?  No      Plan:     I have personally reviewed and noted the following in the patient's chart:   Medical and social history Use of alcohol, tobacco or illicit drugs  Current medications and supplements including opioid prescriptions.  Functional ability and status Nutritional status Physical activity Advanced directives List of other physicians Hospitalizations, surgeries, and ER visits in previous 12 months Vitals Screenings to include cognitive, depression, and falls Referrals and appointments  In addition, I have reviewed and discussed with patient certain preventive protocols, quality metrics, and best practice recommendations. A written personalized care plan for preventive services as well as general preventive health recommendations were provided to patient.     Leroy Kennedy, LPN   06/14/1622   Nurse Notes: NA

## 2020-11-29 NOTE — Patient Instructions (Signed)
Kathy Hobbs , Thank you for taking time to come for your Medicare Wellness Visit. I appreciate your ongoing commitment to your health goals. Please review the following plan we discussed and let me know if I can assist you in the future.   Screening recommendations/referrals: Colonoscopy: no longer required Mammogram: declined Bone Density: will call when ready to book Recommended yearly ophthalmology/optometry visit for glaucoma screening and checkup Recommended yearly dental visit for hygiene and checkup  Vaccinations: Influenza vaccine: up to date  Pneumococcal vaccine: declined Tdap vaccine: up to date Shingles vaccine: education provided    Advanced directives: copy requested  Conditions/risks identified: na   Next appointment: 02-28-2021 1:00 Dr. Adelina Mings   Preventive Care 17 Years and Older, Female Preventive care refers to lifestyle choices and visits with your health care provider that can promote health and wellness. What does preventive care include? A yearly physical exam. This is also called an annual well check. Dental exams once or twice a year. Routine eye exams. Ask your health care provider how often you should have your eyes checked. Personal lifestyle choices, including: Daily care of your teeth and gums. Regular physical activity. Eating a healthy diet. Avoiding tobacco and drug use. Limiting alcohol use. Practicing safe sex. Taking low-dose aspirin every day. Taking vitamin and mineral supplements as recommended by your health care provider. What happens during an annual well check? The services and screenings done by your health care provider during your annual well check will depend on your age, overall health, lifestyle risk factors, and family history of disease. Counseling  Your health care provider may ask you questions about your: Alcohol use. Tobacco use. Drug use. Emotional well-being. Home and relationship well-being. Sexual  activity. Eating habits. History of falls. Memory and ability to understand (cognition). Work and work Statistician. Reproductive health. Screening  You may have the following tests or measurements: Height, weight, and BMI. Blood pressure. Lipid and cholesterol levels. These may be checked every 5 years, or more frequently if you are over 62 years old. Skin check. Lung cancer screening. You may have this screening every year starting at age 64 if you have a 30-pack-year history of smoking and currently smoke or have quit within the past 15 years. Fecal occult blood test (FOBT) of the stool. You may have this test every year starting at age 75. Flexible sigmoidoscopy or colonoscopy. You may have a sigmoidoscopy every 5 years or a colonoscopy every 10 years starting at age 76. Hepatitis C blood test. Hepatitis B blood test. Sexually transmitted disease (STD) testing. Diabetes screening. This is done by checking your blood sugar (glucose) after you have not eaten for a while (fasting). You may have this done every 1-3 years. Bone density scan. This is done to screen for osteoporosis. You may have this done starting at age 82. Mammogram. This may be done every 1-2 years. Talk to your health care provider about how often you should have regular mammograms. Talk with your health care provider about your test results, treatment options, and if necessary, the need for more tests. Vaccines  Your health care provider may recommend certain vaccines, such as: Influenza vaccine. This is recommended every year. Tetanus, diphtheria, and acellular pertussis (Tdap, Td) vaccine. You may need a Td booster every 10 years. Zoster vaccine. You may need this after age 18. Pneumococcal 13-valent conjugate (PCV13) vaccine. One dose is recommended after age 53. Pneumococcal polysaccharide (PPSV23) vaccine. One dose is recommended after age 47. Talk to your health care  provider about which screenings and vaccines  you need and how often you need them. This information is not intended to replace advice given to you by your health care provider. Make sure you discuss any questions you have with your health care provider. Document Released: 06/16/2015 Document Revised: 02/07/2016 Document Reviewed: 03/21/2015 Elsevier Interactive Patient Education  2017 [place] Prevention in the Home Falls can cause injuries. They can happen to people of all ages. There are many things you can do to make your home safe and to help prevent falls. What can I do on the outside of my home? Regularly fix the edges of walkways and driveways and fix any cracks. Remove anything that might make you trip as you walk through a door, such as a raised step or threshold. Trim any bushes or trees on the path to your home. Use bright outdoor lighting. Clear any walking paths of anything that might make someone trip, such as rocks or tools. Regularly check to see if handrails are loose or broken. Make sure that both sides of any steps have handrails. Any raised decks and porches should have guardrails on the edges. Have any leaves, snow, or ice cleared regularly. Use sand or salt on walking paths during winter. Clean up any spills in your garage right away. This includes oil or grease spills. What can I do in the bathroom? Use night lights. Install grab bars by the toilet and in the tub and shower. Do not use towel bars as grab bars. Use non-skid mats or decals in the tub or shower. If you need to sit down in the shower, use a plastic, non-slip stool. Keep the floor dry. Clean up any water that spills on the floor as soon as it happens. Remove soap buildup in the tub or shower regularly. Attach bath mats securely with double-sided non-slip rug tape. Do not have throw rugs and other things on the floor that can make you trip. What can I do in the bedroom? Use night lights. Make sure that you have a light by your bed that  is easy to reach. Do not use any sheets or blankets that are too big for your bed. They should not hang down onto the floor. Have a firm chair that has side arms. You can use this for support while you get dressed. Do not have throw rugs and other things on the floor that can make you trip. What can I do in the kitchen? Clean up any spills right away. Avoid walking on wet floors. Keep items that you use a lot in easy-to-reach places. If you need to reach something above you, use a strong step stool that has a grab bar. Keep electrical cords out of the way. Do not use floor polish or wax that makes floors slippery. If you must use wax, use non-skid floor wax. Do not have throw rugs and other things on the floor that can make you trip. What can I do with my stairs? Do not leave any items on the stairs. Make sure that there are handrails on both sides of the stairs and use them. Fix handrails that are broken or loose. Make sure that handrails are as long as the stairways. Check any carpeting to make sure that it is firmly attached to the stairs. Fix any carpet that is loose or worn. Avoid having throw rugs at the top or bottom of the stairs. If you do have throw rugs, attach them to the  floor with carpet tape. Make sure that you have a light switch at the top of the stairs and the bottom of the stairs. If you do not have them, ask someone to add them for you. What else can I do to help prevent falls? Wear shoes that: Do not have high heels. Have rubber bottoms. Are comfortable and fit you well. Are closed at the toe. Do not wear sandals. If you use a stepladder: Make sure that it is fully opened. Do not climb a closed stepladder. Make sure that both sides of the stepladder are locked into place. Ask someone to hold it for you, if possible. Clearly mark and make sure that you can see: Any grab bars or handrails. First and last steps. Where the edge of each step is. Use tools that help you  move around (mobility aids) if they are needed. These include: Canes. Walkers. Scooters. Crutches. Turn on the lights when you go into a dark area. Replace any light bulbs as soon as they burn out. Set up your furniture so you have a clear path. Avoid moving your furniture around. If any of your floors are uneven, fix them. If there are any pets around you, be aware of where they are. Review your medicines with your doctor. Some medicines can make you feel dizzy. This can increase your chance of falling. Ask your doctor what other things that you can do to help prevent falls. This information is not intended to replace advice given to you by your health care provider. Make sure you discuss any questions you have with your health care provider. Document Released: 03/16/2009 Document Revised: 10/26/2015 Document Reviewed: 06/24/2014 Elsevier Interactive Patient Education  2017 [employer].

## 2020-12-07 ENCOUNTER — Other Ambulatory Visit: Payer: Self-pay

## 2020-12-07 DIAGNOSIS — E785 Hyperlipidemia, unspecified: Secondary | ICD-10-CM

## 2020-12-07 MED ORDER — ATORVASTATIN CALCIUM 80 MG PO TABS: 80.0000 mg | ORAL_TABLET | Freq: Every day | ORAL | 3 refills | Status: DC

## 2020-12-15 ENCOUNTER — Ambulatory Visit (INDEPENDENT_AMBULATORY_CARE_PROVIDER_SITE_OTHER): Payer: Medicare Other

## 2020-12-15 ENCOUNTER — Other Ambulatory Visit: Payer: Self-pay

## 2020-12-15 DIAGNOSIS — I4891 Unspecified atrial fibrillation: Secondary | ICD-10-CM | POA: Diagnosis not present

## 2020-12-15 DIAGNOSIS — Z7901 Long term (current) use of anticoagulants: Secondary | ICD-10-CM

## 2020-12-15 LAB — POCT INR: INR: 3.5 — AB (ref 2.0–3.0)

## 2020-12-15 NOTE — Patient Instructions (Signed)
Hold tonight only and then Decrease to 0.5 tablet Daily, except 1 tablet Wednesday.  Repeat INR in 3 weeks

## 2021-01-05 ENCOUNTER — Other Ambulatory Visit: Payer: Self-pay

## 2021-01-05 ENCOUNTER — Ambulatory Visit (INDEPENDENT_AMBULATORY_CARE_PROVIDER_SITE_OTHER): Payer: Medicare Other

## 2021-01-05 DIAGNOSIS — Z7901 Long term (current) use of anticoagulants: Secondary | ICD-10-CM | POA: Diagnosis not present

## 2021-01-05 DIAGNOSIS — I4891 Unspecified atrial fibrillation: Secondary | ICD-10-CM | POA: Diagnosis not present

## 2021-01-05 LAB — POCT INR: INR: 2.3 (ref 2.0–3.0)

## 2021-01-05 NOTE — Patient Instructions (Signed)
Continue taking 0.5 tablet Daily, except 1 tablet Wednesday.  Repeat INR in 6 weeks

## 2021-02-16 ENCOUNTER — Ambulatory Visit (INDEPENDENT_AMBULATORY_CARE_PROVIDER_SITE_OTHER): Payer: Medicare Other

## 2021-02-16 ENCOUNTER — Other Ambulatory Visit: Payer: Self-pay

## 2021-02-16 DIAGNOSIS — I4891 Unspecified atrial fibrillation: Secondary | ICD-10-CM

## 2021-02-16 DIAGNOSIS — Z7901 Long term (current) use of anticoagulants: Secondary | ICD-10-CM

## 2021-02-16 LAB — POCT INR: INR: 2.5 (ref 2.0–3.0)

## 2021-02-16 NOTE — Patient Instructions (Signed)
Continue taking 0.5 tablet Daily, except 1 tablet Wednesday.  Repeat INR in 6 weeks

## 2021-02-28 ENCOUNTER — Ambulatory Visit: Payer: Medicare Other | Admitting: Family Medicine

## 2021-03-19 ENCOUNTER — Telehealth: Payer: Self-pay | Admitting: Internal Medicine

## 2021-03-19 ENCOUNTER — Other Ambulatory Visit: Payer: Self-pay

## 2021-03-19 DIAGNOSIS — Z7901 Long term (current) use of anticoagulants: Secondary | ICD-10-CM

## 2021-03-19 NOTE — Telephone Encounter (Signed)
Faxed inr to  [facility], [place] [address] Fax# [phone]

## 2021-03-19 NOTE — Telephone Encounter (Signed)
Pt is requesting a Lab order to get her INR checked to be sent t                                 [facility] [address] [place], [place] [address] Fax# [phone] Phone# [phone]

## 2021-03-26 LAB — POCT INR: INR: 1.8 — AB (ref 2.0–3.0)

## 2021-03-27 LAB — PROTIME-INR
INR: 1.8 — ABNORMAL HIGH (ref 0.9–1.2)
Prothrombin Time: 18.7 s — ABNORMAL HIGH (ref 9.1–12.0)

## 2021-03-29 ENCOUNTER — Telehealth: Payer: Self-pay

## 2021-03-29 ENCOUNTER — Ambulatory Visit (INDEPENDENT_AMBULATORY_CARE_PROVIDER_SITE_OTHER): Payer: Medicare Other | Admitting: Cardiology

## 2021-03-29 DIAGNOSIS — Z7901 Long term (current) use of anticoagulants: Secondary | ICD-10-CM

## 2021-03-29 NOTE — Telephone Encounter (Signed)
Lp's daughter message in trying to reach pt regarding INR result/recommendation.  Phone #s not working.

## 2021-04-17 ENCOUNTER — Other Ambulatory Visit: Payer: Self-pay | Admitting: *Deleted

## 2021-04-17 MED ORDER — WARFARIN SODIUM 2 MG PO TABS: ORAL_TABLET | ORAL | 0 refills | Status: DC

## 2021-04-17 NOTE — Telephone Encounter (Signed)
Prescription refill request received for warfarin [place]: 07/07/2020, [place] Next INR check: 11/21 Warfarin tablet strength: 2mg 

## 2021-04-18 ENCOUNTER — Telehealth: Payer: Self-pay

## 2021-04-18 ENCOUNTER — Other Ambulatory Visit: Payer: Self-pay

## 2021-04-18 MED ORDER — WARFARIN SODIUM 2 MG PO TABS: ORAL_TABLET | ORAL | 0 refills | Status: DC

## 2021-04-18 NOTE — Telephone Encounter (Signed)
Spoke with pt and informed her that she will just need to call the pharmacy that she wants medication to be sent to and inform them that she wants to transfer.

## 2021-04-18 NOTE — Telephone Encounter (Signed)
Patient refill request.  Patient in [place] for the winter. [address] [place], [place] [address] [phone]  DULoxetine (CYMBALTA) 20 MG capsule [[id number]]   sertraline (ZOLOFT) 50 MG tablet [[id number]]

## 2021-04-20 ENCOUNTER — Telehealth: Payer: Self-pay | Admitting: Family Medicine

## 2021-04-20 LAB — PROTIME-INR: INR: 2.2 — AB (ref 0.9–1.1)

## 2021-04-20 MED ORDER — DULOXETINE HCL 20 MG PO CPEP: 20.0000 mg | ORAL_CAPSULE | Freq: Every day | ORAL | 0 refills | Status: DC

## 2021-04-20 NOTE — Telephone Encounter (Signed)
Please see other encounter.

## 2021-04-20 NOTE — Telephone Encounter (Signed)
Please address

## 2021-04-20 NOTE — Telephone Encounter (Signed)
[place], Kathy Hobbs  YM   11:18 AM Kathy Hobbs has moved to [place] and needs her meds sent to [facility] at [employer]. [place],  Kathy Hobbs. [phone], [phone]. She needs refills for her Sertraline and Duloxetine.

## 2021-04-20 NOTE — Addendum Note (Signed)
Addended by: Kavin Leech on: 04/20/2021 03:11 PM   Modules accepted: Orders

## 2021-04-20 NOTE — Telephone Encounter (Signed)
Spoke with pt and informed her that she will just need to call the [facility] that she wants medication to be sent to and inform them that she wants to transfer from [facility].

## 2021-04-20 NOTE — Telephone Encounter (Signed)
Pt husband called back. Kathy Hobbs in [place] had no refills available on Duloxetine to transfer.   Please send new RX for DULoxetine (CYMBALTA) 20 MG capsule. If possible send to [id number] - [place], [place].  If it cannot be sent to [place] please send to [facility] [phone] - [place], [place]  Please notify them which pharmacy it is sent to.  Lennox Grumbles, husband - Call back # [phone]  Pt lives 6 mo 1 day in [place] 5 mo 30 days in [place] each year.

## 2021-04-20 NOTE — Telephone Encounter (Signed)
Spoke with pt regarding medication and need for following up every 6 mos for medication refill per Dr. Lucita Lora note and patient instructions. 30 d/s given.   Note: Per pcp pt will need to est with a provider in [place] to receive refills if can't do appt her in the state of [place] with Dr. Raoul Pitch.

## 2021-04-23 ENCOUNTER — Ambulatory Visit (INDEPENDENT_AMBULATORY_CARE_PROVIDER_SITE_OTHER): Payer: Medicare Other | Admitting: Cardiology

## 2021-04-23 DIAGNOSIS — I4891 Unspecified atrial fibrillation: Secondary | ICD-10-CM | POA: Diagnosis not present

## 2021-04-23 DIAGNOSIS — Z7901 Long term (current) use of anticoagulants: Secondary | ICD-10-CM | POA: Diagnosis not present

## 2021-05-10 ENCOUNTER — Telehealth: Payer: Self-pay | Admitting: Internal Medicine

## 2021-05-10 ENCOUNTER — Other Ambulatory Visit: Payer: Self-pay

## 2021-05-10 ENCOUNTER — Other Ambulatory Visit: Payer: Self-pay | Admitting: Internal Medicine

## 2021-05-10 DIAGNOSIS — E785 Hyperlipidemia, unspecified: Secondary | ICD-10-CM

## 2021-05-10 MED ORDER — IRBESARTAN 300 MG PO TABS: ORAL_TABLET | ORAL | 1 refills | Status: DC

## 2021-05-10 MED ORDER — ATORVASTATIN CALCIUM 80 MG PO TABS: 80.0000 mg | ORAL_TABLET | Freq: Every day | ORAL | 3 refills | Status: DC

## 2021-05-10 NOTE — Telephone Encounter (Signed)
*  STAT* If patient is at the pharmacy, call can be transferred to refill team.   1. Which medications need to be refilled? (please list name of each medication and dose if known) irbesartan (AVAPRO) 300 MG tablet atorvastatin (LIPITOR) 80 MG tablet  2. Which pharmacy/location (including street and city if local pharmacy) is medication to be sent to? [id number] [place], [place]  3. Do they need a 30 day or 90 day supply? 90 day supply   Pt currently in [place] for the winter, she has an upcoming appt with [facility] 2023

## 2021-05-18 ENCOUNTER — Telehealth: Payer: Self-pay

## 2021-05-18 NOTE — Telephone Encounter (Signed)
Lpm reminding her to get PT/INR drawn.

## 2021-05-21 ENCOUNTER — Other Ambulatory Visit: Payer: Self-pay | Admitting: Internal Medicine

## 2021-05-22 LAB — PROTIME-INR
INR: 2.2 — ABNORMAL HIGH (ref 0.9–1.2)
Prothrombin Time: 21.9 s — ABNORMAL HIGH (ref 9.1–12.0)

## 2021-05-23 ENCOUNTER — Ambulatory Visit (INDEPENDENT_AMBULATORY_CARE_PROVIDER_SITE_OTHER): Payer: Medicare Other | Admitting: Cardiology

## 2021-05-23 DIAGNOSIS — Z7901 Long term (current) use of anticoagulants: Secondary | ICD-10-CM

## 2021-06-18 ENCOUNTER — Other Ambulatory Visit: Payer: Self-pay

## 2021-06-18 DIAGNOSIS — I4891 Unspecified atrial fibrillation: Secondary | ICD-10-CM

## 2021-06-18 DIAGNOSIS — Z7901 Long term (current) use of anticoagulants: Secondary | ICD-10-CM

## 2021-06-21 ENCOUNTER — Telehealth: Payer: Self-pay

## 2021-06-21 NOTE — Telephone Encounter (Signed)
Lpm to check INR 

## 2021-06-25 ENCOUNTER — Other Ambulatory Visit: Payer: Self-pay

## 2021-06-25 ENCOUNTER — Telehealth: Payer: Self-pay

## 2021-06-25 DIAGNOSIS — I4891 Unspecified atrial fibrillation: Secondary | ICD-10-CM

## 2021-06-25 NOTE — Telephone Encounter (Signed)
Spoke with pt and reminded her to have INR lab drawn.  She confirmed for 1/24

## 2021-06-27 ENCOUNTER — Other Ambulatory Visit: Payer: Self-pay

## 2021-06-27 ENCOUNTER — Encounter: Payer: Self-pay | Admitting: Internal Medicine

## 2021-06-27 ENCOUNTER — Ambulatory Visit (INDEPENDENT_AMBULATORY_CARE_PROVIDER_SITE_OTHER): Payer: Medicare Other | Admitting: Cardiovascular Disease

## 2021-06-27 DIAGNOSIS — I4891 Unspecified atrial fibrillation: Secondary | ICD-10-CM | POA: Diagnosis not present

## 2021-06-27 DIAGNOSIS — Z7901 Long term (current) use of anticoagulants: Secondary | ICD-10-CM | POA: Diagnosis not present

## 2021-06-27 DIAGNOSIS — I10 Essential (primary) hypertension: Secondary | ICD-10-CM

## 2021-06-27 LAB — PROTIME-INR
INR: 2.2 — ABNORMAL HIGH (ref 0.9–1.2)
Prothrombin Time: 21.7 s — ABNORMAL HIGH (ref 9.1–12.0)

## 2021-06-27 MED ORDER — WARFARIN SODIUM 2 MG PO TABS: ORAL_TABLET | ORAL | 0 refills | Status: DC

## 2021-06-27 MED ORDER — SPIRONOLACTONE 25 MG PO TABS: 12.5000 mg | ORAL_TABLET | Freq: Every day | ORAL | 0 refills | Status: DC

## 2021-07-16 ENCOUNTER — Other Ambulatory Visit: Payer: Self-pay | Admitting: Internal Medicine

## 2021-07-17 ENCOUNTER — Ambulatory Visit (INDEPENDENT_AMBULATORY_CARE_PROVIDER_SITE_OTHER): Payer: Medicare Other | Admitting: Cardiology

## 2021-07-17 DIAGNOSIS — Z7901 Long term (current) use of anticoagulants: Secondary | ICD-10-CM

## 2021-07-17 DIAGNOSIS — Z5181 Encounter for therapeutic drug level monitoring: Secondary | ICD-10-CM | POA: Diagnosis not present

## 2021-07-17 DIAGNOSIS — I4891 Unspecified atrial fibrillation: Secondary | ICD-10-CM

## 2021-07-17 LAB — PROTIME-INR
INR: 1.5 — ABNORMAL HIGH (ref 0.9–1.2)
Prothrombin Time: 15.2 s — ABNORMAL HIGH (ref 9.1–12.0)

## 2021-07-17 NOTE — Patient Instructions (Signed)
Description   Called and spoke to pt and instructed her to take 1 tablet of warfarin today and 1.5 tablets tomorrow, then continue to take warfarin 1/2 a tablet daily except for 1 tablet on wednesdays. Recheck INR in 1.5 weeks. [facility] [phone].

## 2021-07-26 ENCOUNTER — Telehealth: Payer: Self-pay

## 2021-07-26 NOTE — Telephone Encounter (Signed)
Reminded patient to have INR drawn.  She will go to lab on Friday 2/24.

## 2021-07-30 ENCOUNTER — Encounter: Payer: Self-pay | Admitting: Cardiology

## 2021-07-30 DIAGNOSIS — Z7901 Long term (current) use of anticoagulants: Secondary | ICD-10-CM

## 2021-07-31 ENCOUNTER — Ambulatory Visit (INDEPENDENT_AMBULATORY_CARE_PROVIDER_SITE_OTHER): Payer: Medicare Other | Admitting: Cardiology

## 2021-07-31 DIAGNOSIS — I4891 Unspecified atrial fibrillation: Secondary | ICD-10-CM

## 2021-07-31 DIAGNOSIS — Z5181 Encounter for therapeutic drug level monitoring: Secondary | ICD-10-CM

## 2021-07-31 LAB — PROTIME-INR
INR: 2.7 — ABNORMAL HIGH (ref 0.9–1.2)
Prothrombin Time: 26.6 s — ABNORMAL HIGH (ref 9.1–12.0)

## 2021-07-31 NOTE — Patient Instructions (Signed)
Description   Called and spoke to pt and instructed her to take  continue to take warfarin 1/2 a tablet daily except for 1 tablet on wednesdays. Recheck INR in 2 weeks. [facility] [phone].

## 2021-08-01 NOTE — Progress Notes (Signed)
This encounter was created in error - please disregard.

## 2021-08-06 ENCOUNTER — Other Ambulatory Visit: Payer: Self-pay | Admitting: Internal Medicine

## 2021-08-06 ENCOUNTER — Other Ambulatory Visit: Payer: Self-pay | Admitting: General Practice

## 2021-08-06 DIAGNOSIS — E785 Hyperlipidemia, unspecified: Secondary | ICD-10-CM

## 2021-08-14 ENCOUNTER — Other Ambulatory Visit: Payer: Self-pay | Admitting: *Deleted

## 2021-08-14 DIAGNOSIS — I4891 Unspecified atrial fibrillation: Secondary | ICD-10-CM

## 2021-08-15 ENCOUNTER — Ambulatory Visit (INDEPENDENT_AMBULATORY_CARE_PROVIDER_SITE_OTHER): Payer: Medicare Other | Admitting: Cardiology

## 2021-08-15 DIAGNOSIS — I4891 Unspecified atrial fibrillation: Secondary | ICD-10-CM | POA: Diagnosis not present

## 2021-08-15 DIAGNOSIS — Z5181 Encounter for therapeutic drug level monitoring: Secondary | ICD-10-CM | POA: Diagnosis not present

## 2021-08-15 DIAGNOSIS — Z7901 Long term (current) use of anticoagulants: Secondary | ICD-10-CM

## 2021-08-15 LAB — PROTIME-INR
INR: 3.3 — ABNORMAL HIGH (ref 0.9–1.2)
Prothrombin Time: 32.5 s — ABNORMAL HIGH (ref 9.1–12.0)

## 2021-08-28 ENCOUNTER — Other Ambulatory Visit: Payer: Self-pay | Admitting: Internal Medicine

## 2021-08-29 ENCOUNTER — Ambulatory Visit (INDEPENDENT_AMBULATORY_CARE_PROVIDER_SITE_OTHER): Payer: Medicare Other | Admitting: Cardiology

## 2021-08-29 ENCOUNTER — Telehealth: Payer: Self-pay

## 2021-08-29 DIAGNOSIS — Z7901 Long term (current) use of anticoagulants: Secondary | ICD-10-CM

## 2021-08-29 LAB — PROTIME-INR
INR: 2.2 — ABNORMAL HIGH (ref 0.9–1.2)
Prothrombin Time: 21.6 s — ABNORMAL HIGH (ref 9.1–12.0)

## 2021-08-29 NOTE — Telephone Encounter (Signed)
done

## 2021-09-17 ENCOUNTER — Telehealth: Payer: Self-pay

## 2021-09-17 ENCOUNTER — Ambulatory Visit (INDEPENDENT_AMBULATORY_CARE_PROVIDER_SITE_OTHER): Payer: Medicare Other

## 2021-09-17 ENCOUNTER — Ambulatory Visit (INDEPENDENT_AMBULATORY_CARE_PROVIDER_SITE_OTHER): Payer: Medicare Other | Admitting: Internal Medicine

## 2021-09-17 ENCOUNTER — Encounter: Payer: Self-pay | Admitting: Internal Medicine

## 2021-09-17 VITALS — BP 126/72 | HR 59 | Ht 66.0 in | Wt 168.8 lb

## 2021-09-17 DIAGNOSIS — E785 Hyperlipidemia, unspecified: Secondary | ICD-10-CM | POA: Diagnosis not present

## 2021-09-17 DIAGNOSIS — I739 Peripheral vascular disease, unspecified: Secondary | ICD-10-CM | POA: Diagnosis not present

## 2021-09-17 DIAGNOSIS — I701 Atherosclerosis of renal artery: Secondary | ICD-10-CM

## 2021-09-17 DIAGNOSIS — I4891 Unspecified atrial fibrillation: Secondary | ICD-10-CM

## 2021-09-17 DIAGNOSIS — I779 Disorder of arteries and arterioles, unspecified: Secondary | ICD-10-CM

## 2021-09-17 DIAGNOSIS — Z7901 Long term (current) use of anticoagulants: Secondary | ICD-10-CM

## 2021-09-17 LAB — POCT INR: INR: 2.6 (ref 2.0–3.0)

## 2021-09-17 MED ORDER — IRBESARTAN 300 MG PO TABS: ORAL_TABLET | ORAL | 3 refills | Status: DC

## 2021-09-17 MED ORDER — ATORVASTATIN CALCIUM 80 MG PO TABS: 80.0000 mg | ORAL_TABLET | Freq: Every day | ORAL | 3 refills | Status: DC

## 2021-09-17 NOTE — Progress Notes (Signed)
? ? ?OFFICE NOTE ? ?Chief Complaint:  ?Follow-up  ? ?Primary Care Physician: ?Raoul Pitch, Renee A, DO ? ?HPI:  ?Kathy Hobbs is a pleasant 81 year old female who is kindly referred to me for establishing cardiac care. Kathy Hobbs has not previously seen a cardiologist, but has had several years of atrial fibrillation. Per her report she says it comes and goes although she was noted to be in A. fib today. She was placed on warfarin by her primary care provider in [place] prior to moving up to live with her daughter. Her INR was recently checked by her PCP at 3.1 and mild adjustments were made to her medicines. It was requested that we follow her warfarin and manage it in our office. Kathy Hobbs also has a significant history of PAD. She recently had carotid Dopplers apparently by her neurologist in [place] which showed a 40% right carotid artery stenosis. She also has a history of PAD and peripheral stents as well as bilateral renal artery stenosis status post PCI about one year ago by Dr. Sullivan Lone in [facility]. She was previously seeing Dr. Jackalyn Lombard, with the [facility] in [place]. She currently reports fatigue and some shortness of breath with exertion. She denies any chest pain. She's never had an ischemia evaluation to her knowledge. Recently he's been having some falls and in fact fell and fractured her right humerus. That is very slow to heal. She is also reporting some pain when walking and swelling of her legs. ? ?Kathy Hobbs returns today for follow-up. Unfortunately we have not been able to locate records from her cardiologist in [facility]. She subsequently underwent lower extremity arterial Dopplers as well as renal artery Dopplers here. This demonstrated patent lower extremity stents as well as patent bilateral renal stents. She underwent stress testing as well which demonstrated no ischemia and LVEF which was preserved. She seems to be tolerating warfarin without  bleeding problems although has been having some falls. In fact she fell twice last week. I am concerned about this and her daughter apparently is working on trying to come up with the cause of her falls. Again in the office today, we discussed the possibility of a Watchman device which is a left atrial appendage occluder if she were not to be candidate for warfarin therapy long-term.  ? ?06/27/2016 ? ?Kathy Hobbs returns today for follow-up. She's done well over the last year. She recently had carotid and lower extremity arterial Dopplers in December which were stable. She does have a right carotid bruit. She reports that her falls have improved significantly. She saw a neurologist a couple times and went through some therapy. I think it safe for her to continue on warfarin for now. INR was slightly had arranged today and will be adjusted. She has been taking atorvastatin and is well overdue for repeat lipid profile. Was ordered last year but never obtained. Although she is not fasting today, we'll send her for lab work. There has been about 11 pound weight gain. I counseled her on trying to cut back on food intake and more exercise. ? ?09/17/2021 ? ?Kathy Hobbs returns today for follow-up.  She has been spending 6 months a year down in [place] and otherwise resides in [place].  It has been a while since have seen her in the office.  She needs refills of her atorvastatin and irbesartan.  She is on warfarin but is contemplating switching to a direct oral anticoagulant.  She does want to  research the cost first.  She denies any chest pain or shortness of breath.  She has a history of carotid artery stenosis but has not had reimaging of that since 2019.  She is also had a prior iliac stent and has not had lower extremity arterial Dopplers in several years. ? ?PMHx:  ?Past Medical History:  ?Diagnosis Date  ? Atrial fibrillation ([place])   ? Breast cancer ([place])   ? Depression   ? Hyperlipidemia   ?  Hypertension   ? Right humeral fracture 2016  ? Skin cancer of nose   ? Unknown type, patient reports needed graft  ? Urine incontinence   ? Vertigo   ? ? ?Past Surgical History:  ?Procedure Laterality Date  ? BREAST BIOPSY    ? BREAST LUMPECTOMY    ? FEMORAL ARTERY STENT    ? ? pt states "Stent in my leg"  ? RENAL ARTERY STENT Bilateral   ? ? ?FAMHx:  ?Family History  ?Problem Relation Age of Onset  ? Heart attack Father   ? Depression Mother   ? Alcohol abuse Brother   ? Breast cancer Paternal Grandmother   ? Alcoholism Brother   ? Depression Brother   ? ? ?SOCHx:  ? reports that she has never smoked. She has never used smokeless tobacco. She reports that she does not drink alcohol and does not use drugs. ? ?ALLERGIES:  ?No Known Allergies ? ?ROS: ?A comprehensive review of systems was negative. ? ?HOME MEDS: ?Current Outpatient Medications  ?Medication Sig Dispense Refill  ? atorvastatin (LIPITOR) 80 MG tablet Take 1 tablet (80 mg total) by mouth daily. PATIENT MUST KEEP SCHEDULED APPOINTMENT FOR FUTURE REFILLS. (Patient taking differently: Take 80 mg by mouth daily.) 45 tablet 0  ? cholecalciferol (VITAMIN D) 1000 units tablet Take 1,000 Units by mouth daily.    ? DULoxetine (CYMBALTA) 20 MG capsule Take 1 capsule (20 mg total) by mouth daily. 30 capsule 0  ? irbesartan (AVAPRO) 300 MG tablet TAKE ONE TABLET BY MOUTH EVERY DAY FOR BLOOD PRESSURE, MUST MAKE APPOINTMENT FOR REFILLS 30 tablet 0  ? Mag Aspart-Potassium Aspart (RA POTASSIUM/MAGNESIUM PO) Take by mouth.    ? sertraline (ZOLOFT) 25 MG tablet Take 1 tablet (25 mg total) by mouth daily. 90 tablet 1  ? sertraline (ZOLOFT) 50 MG tablet Take 1 tablet (50 mg total) by mouth daily. 90 tablet 1  ? spironolactone (ALDACTONE) 25 MG tablet Take 0.5 tablets (12.5 mg total) by mouth daily. 45 tablet 0  ? warfarin (COUMADIN) 2 MG tablet TAKE 1/2 TO 1 TABLET BY MOUTH DAILY AS DIRECTED 45 tablet 0  ? ?No current facility-administered medications for this visit.   ? ? ?LABS/IMAGING: ?No results found for this or any previous visit (from the past 48 hour(s)). ?No results found. ? ?WEIGHTS: ?Wt Readings from Last 3 Encounters:  ?09/17/21 168 lb 12.8 oz (76.6 kg)  ?09/13/20 174 lb (78.9 kg)  ?07/07/20 169 lb 14.4 oz (77.1 kg)  ? ? ?VITALS: ?BP 126/72   Pulse (!) 59   Ht '5\' 6"'$  (1.676 m)   Wt 168 lb 12.8 oz (76.6 kg)   SpO2 99%   BMI 27.25 kg/m?  ? ?EXAM: ?General appearance: alert and no distress ?Neck: no carotid bruit and no JVD ?Lungs: clear to auscultation bilaterally ?Heart: irregularly irregular rhythm ?Abdomen: soft, non-tender; bowel sounds normal; no masses,  no organomegaly ?Extremities: extremities normal, atraumatic, no cyanosis or edema ?Pulses: 2+ and symmetric ?Skin: Skin color, texture,  turgor normal. No rashes or lesions ?Neurologic: Grossly normal ?Psych: Pleasant ? ?EKG: ?A-fib with slow ventricular response at 59, nonspecific T wave changes-personally reviewed ? ?ASSESSMENT: ?Atrial fibrillation possibly paroxysmal, asymptomatic- CHADSVASC 3 ?Hypertension-controlled ?PAD-status post peripheral stenting - normal ABI's and patent stents (04/2015) ?Bilateral renal artery stenosis status post bilateral stenting (patent stents 04/2015) ?Carotid artery disease with 40% R ICA stenosis ?History of dyslipidemia-on lipitor ?History of falls - improved with therapy ? ?PLAN: ?1.   Kathy Hobbs seems to be doing well.  She is contemplating coming off of warfarin for a DOAC.  I would recommend Eliquis.  She can discuss this further with the anticoagulation clinic pharmacist if she wishes to switch.  She does have PAD with carotid artery stenosis and lower extremity arterial stenosis with renal stents.  This is all due for reimaging and I will order that today.  She also needs an updated lipid profile. ? ?Follow-up with me annually or sooner as necessary. ? ?Pixie Casino, MD, [facility] FACP  ?[facility]  ?Medical Director of the Advanced Lipid  Disorders &  ?Cardiovascular Risk Reduction Clinic ?Diplomate of the [employer] of Clinical Lipidology ?Attending Cardiologist  ?Direct Dial: [phone]  Fax: [phone]  ?Website:  [email or web address] ? ? ?Chrissie Noa

## 2021-09-17 NOTE — Patient Instructions (Signed)
continue to take warfarin 1/2 a tablet daily except for 1 tablet on wednesdays. Recheck INR in 6 weeks. Coumadin Clinic [phone].  ?

## 2021-09-17 NOTE — Telephone Encounter (Signed)
-----   Message from Rockne Menghini, RPH-CPP sent at 09/17/2021  4:07 PM EDT ----- ?Can you call her pharmacy ([facility]) and see if she has Rx insurance and what it is ? ?No rush ? ?Thank you! ?K ? ?

## 2021-09-17 NOTE — Patient Instructions (Signed)
Medication Instructions:  ?Your physician recommends that you continue on your current medications as directed. Please refer to the Current Medication list given to you today. ? ?*If you need a refill on your cardiac medications before your next appointment, please call your pharmacy* ? ?Testing/Procedures: ?Carotid Doppler & Lower Extremity Arterial Doppler at Dr. Lysbeth Penner office ? ? ?Follow-Up: ?At [facility], you and your health needs are our priority.  As part of our continuing mission to provide you with exceptional heart care, we have created designated Provider Care Teams.  These Care Teams include your primary Cardiologist (physician) and Advanced Practice Providers (APPs -  Physician Assistants and Nurse Practitioners) who all work together to provide you with the care you need, when you need it. ? ?We recommend signing up for the patient portal called "MyChart".  Sign up information is provided on this After Visit Summary.  MyChart is used to connect with patients for Virtual Visits (Telemedicine).  Patients are able to view lab/test results, encounter notes, upcoming appointments, etc.  Non-urgent messages can be sent to your provider as well.   ?To learn more about what you can do with MyChart, go to [email or web address].   ? ?Your next appointment:   ?12 month(s) ? ?The format for your next appointment:   ?In Person ? ?Provider:   ?Pixie Casino, MD { ? ? ? ?

## 2021-09-17 NOTE — Telephone Encounter (Signed)
Called pharmacy to obtain insurance information. They stated that they do not have drug insurance and that they have been doing discount cards instead. I will route this to kristin alvstad.  ?

## 2021-09-21 ENCOUNTER — Encounter: Payer: Self-pay | Admitting: Family Medicine

## 2021-09-21 ENCOUNTER — Ambulatory Visit (INDEPENDENT_AMBULATORY_CARE_PROVIDER_SITE_OTHER): Payer: Medicare Other | Admitting: Family Medicine

## 2021-09-21 VITALS — BP 108/60 | HR 84 | Temp 98.7°F | Ht 66.0 in | Wt 166.0 lb

## 2021-09-21 DIAGNOSIS — E785 Hyperlipidemia, unspecified: Secondary | ICD-10-CM

## 2021-09-21 DIAGNOSIS — R7309 Other abnormal glucose: Secondary | ICD-10-CM | POA: Diagnosis not present

## 2021-09-21 DIAGNOSIS — I4891 Unspecified atrial fibrillation: Secondary | ICD-10-CM | POA: Diagnosis not present

## 2021-09-21 DIAGNOSIS — F339 Major depressive disorder, recurrent, unspecified: Secondary | ICD-10-CM

## 2021-09-21 DIAGNOSIS — N1832 Chronic kidney disease, stage 3b: Secondary | ICD-10-CM | POA: Diagnosis not present

## 2021-09-21 DIAGNOSIS — I10 Essential (primary) hypertension: Secondary | ICD-10-CM

## 2021-09-21 DIAGNOSIS — E559 Vitamin D deficiency, unspecified: Secondary | ICD-10-CM

## 2021-09-21 DIAGNOSIS — D6869 Other thrombophilia: Secondary | ICD-10-CM

## 2021-09-21 DIAGNOSIS — M810 Age-related osteoporosis without current pathological fracture: Secondary | ICD-10-CM

## 2021-09-21 MED ORDER — DULOXETINE HCL 20 MG PO CPEP: 20.0000 mg | ORAL_CAPSULE | Freq: Every day | ORAL | 0 refills | Status: DC

## 2021-09-21 MED ORDER — SERTRALINE HCL 25 MG PO TABS: 25.0000 mg | ORAL_TABLET | Freq: Every day | ORAL | 1 refills | Status: AC

## 2021-09-21 NOTE — Patient Instructions (Addendum)
?  Return in about 24 weeks (around 03/08/2022) for Routine chronic condition follow-up. ? ?

## 2021-09-21 NOTE — Progress Notes (Signed)
? ? ? ?This visit occurred during the SARS-CoV-2 public health emergency.  Safety protocols were in place, including screening questions prior to the visit, additional usage of staff PPE, and extensive cleaning of exam room while observing appropriate contact time as indicated for disinfecting solutions.  ? ? ?Kathy Hobbs , 11/01/1940, 81 y.o., female ?MRN: [id number] ?Patient Care Team  ?  Relationship Specialty Notifications Start End  ?Ma Hillock, DO PCP - General Family Medicine  02/28/15   ?Pixie Casino, MD PCP - Cardiology Cardiology Admissions 12/25/17   ? ? ?Chief Complaint  ?Patient presents with  ? Depression  ?  [facility]; pt is not fasting  ? ?  ?Subjective: Pt presents for an OV for [facility] ?Episode of recurrent major depressive disorder, unspecified depression episode severity ([place]) ?Pt reports she is doing well. She is compliant with cymblata and zoloft 25 mg qd.  ?She has not had her routine lab work since last year. ? ?Essential hypertension, benign ?Managed by cardiology ? ? ?  09/21/2021  ?  1:07 PM 11/29/2020  ?  1:15 PM 01/31/2020  ? 11:26 AM 10/22/2019  ?  1:57 PM 03/10/2019  ?  3:28 PM  ?Depression screen PHQ 2/9  ?Decreased Interest 1 0 0 0 0  ?Down, Depressed, Hopeless 0 0 0 0 0  ?PHQ - 2 Score 1 0 0 0 0  ?Altered sleeping 1  0 0 0  ?Tired, decreased energy   0 3 0  ?Change in appetite 3  0 0 0  ?Feeling bad or failure about yourself  0  0 0 0  ?Trouble concentrating 0  0 0 0  ?Moving slowly or fidgety/restless 0  0 1 0  ?Suicidal thoughts 0  0 0 0  ?PHQ-9 Score 5  0 4 0  ?Difficult doing work/chores    Not difficult at all Not difficult at all  ? ? ?No Known Allergies ?Social History  ? ?Social History Narrative  ? Married. Currently lives with daughter in [place] from [place] secondary to declining health.   ? Wears seatbelt, wears bike helmet.   ? - does not exercise daily.   ? - takes vitamins.   ? - Smoke alarm in the home. Guns in the home, locked case.  ? - feels safe in her  relationships.   ?   ? Epworth Sleepiness Scale = 10 (04/04/2015)  ?   ? Homemaker  ?   ? Highest level of education:  High school  ? ?Past Medical History:  ?Diagnosis Date  ? Atrial fibrillation ([place])   ? Breast cancer ([place])   ? Depression   ? Hemorrhoid 03/03/2015  ? Hyperlipidemia   ? Hypertension   ? Right humeral fracture 2016  ? Skin cancer of nose   ? Unknown type, patient reports needed graft  ? Urine incontinence   ? Vertigo   ? ?Past Surgical History:  ?Procedure Laterality Date  ? BREAST BIOPSY    ? BREAST LUMPECTOMY    ? FEMORAL ARTERY STENT    ? ? pt states "Stent in my leg"  ? RENAL ARTERY STENT Bilateral   ? ?Family History  ?Problem Relation Age of Onset  ? Heart attack Father   ? Depression Mother   ? Alcohol abuse Brother   ? Breast cancer Paternal Grandmother   ? Alcoholism Brother   ? Depression Brother   ? ?Allergies as of 09/21/2021   ?No Known Allergies ?  ? ?  ?Medication  List  ?  ? ?  ? Accurate as of September 21, 2021  1:22 PM. If you have any questions, ask your nurse or doctor.  ?  ?  ? ?  ? ?atorvastatin 80 MG tablet ?Commonly known as: LIPITOR ?Take 1 tablet (80 mg total) by mouth daily. ?  ?cholecalciferol 1000 units tablet ?Commonly known as: VITAMIN D ?Take 1,000 Units by mouth daily. ?  ?DULoxetine 20 MG capsule ?Commonly known as: Cymbalta ?Take 1 capsule (20 mg total) by mouth daily. ?  ?irbesartan 300 MG tablet ?Commonly known as: AVAPRO ?TAKE ONE TABLET BY MOUTH EVERY DAY FOR BLOOD PRESSURE ?  ?RA POTASSIUM/MAGNESIUM PO ?Take by mouth. ?  ?sertraline 25 MG tablet ?Commonly known as: ZOLOFT ?Take 1 tablet (25 mg total) by mouth daily. ?What changed: Another medication with the same name was removed. Continue taking this medication, and follow the directions you see here. ?Changed by: Howard Pouch, DO ?  ?spironolactone 25 MG tablet ?Commonly known as: ALDACTONE ?Take 0.5 tablets (12.5 mg total) by mouth daily. ?  ?warfarin 2 MG tablet ?Commonly known as: COUMADIN ?Take as directed by  the anticoagulation clinic. If you are unsure how to take this medication, talk to your nurse or doctor. ?Original instructions: TAKE 1/2 TO 1 TABLET BY MOUTH DAILY AS DIRECTED ?  ? ?  ? ? ?All past medical history, surgical history, allergies, family history, immunizations andmedications were updated in the EMR today and reviewed under the history and medication portions of their EMR.    ? ?ROS: Negative, with the exception of above mentioned in HPI ? ? ?Objective:  ?BP 108/60   Pulse 84   Temp 98.7 ?F (37.1 ?C) (Oral)   Ht 5' 6" (1.676 m)   Wt 166 lb (75.3 kg)   SpO2 97%   BMI 26.79 kg/m?  ?Body mass index is 26.79 kg/m?Marland Kitchen ?Physical Exam ?Vitals and nursing note reviewed.  ?Constitutional:   ?   General: She is not in acute distress. ?   Appearance: Normal appearance. She is not ill-appearing, toxic-appearing or diaphoretic.  ?HENT:  ?   Head: Normocephalic and atraumatic.  ?   Mouth/Throat:  ?   Mouth: Mucous membranes are moist.  ?Eyes:  ?   General: No scleral icterus.    ?   Right eye: No discharge.     ?   Left eye: No discharge.  ?   Extraocular Movements: Extraocular movements intact.  ?   Conjunctiva/sclera: Conjunctivae normal.  ?   Pupils: Pupils are equal, round, and reactive to light.  ?Cardiovascular:  ?   Rate and Rhythm: Normal rate. Rhythm irregular.  ?   Heart sounds: No murmur heard. ?Pulmonary:  ?   Effort: Pulmonary effort is normal. No respiratory distress.  ?   Breath sounds: Normal breath sounds. No wheezing, rhonchi or rales.  ?Musculoskeletal:  ?   Cervical back: Neck supple. No tenderness.  ?   Right lower leg: No edema.  ?   Left lower leg: No edema.  ?Lymphadenopathy:  ?   Cervical: No cervical adenopathy.  ?Skin: ?   General: Skin is warm and dry.  ?   Coloration: Skin is not jaundiced or pale.  ?   Findings: No erythema or rash.  ?Neurological:  ?   Mental Status: She is alert and oriented to person, place, and time. Mental status is at baseline.  ?   Motor: No weakness.  ?    Gait: Gait normal.  ?  Psychiatric:     ?   Mood and Affect: Mood normal.     ?   Behavior: Behavior normal.     ?   Thought Content: Thought content normal.     ?   Judgment: Judgment normal.  ? ? ?No results found. ?No results found. ?No results found for this or any previous visit (from the past 24 hour(s)). ? ?Assessment/Plan: ?Kathy Hobbs is a 80 y.o. female present for OV for  ?Depression, unspecified depression type ?Stable.  ?Continue zoloft 25 mg QD and cymbalta  20 mg Qd. ?- f/u 5.5 months. ?Elevated a1c: ?A1c collected today ?Essential hypertension, benign/Atrial fibrillation, unspecified type (HCC)/Dyslipidemia/ Long term (current) use of anticoagulants [Z79.01]/PAD (peripheral artery disease) (HCC) ?medications managed by cardiology ?Cbc, cmp, tsh, lipids collected today.  ?Image studies and meds managed by cardio and she saw them last week.  ? ?Stage 3 chronic kidney disease, unspecified whether stage 3a or 3b CKD (HCC)/Vitamin D deficiency/Osteoporosis, unspecified osteoporosis type, unspecified pathological fracture presence ?Renal dose meds when appropriate ?Avoid nsaids.  ?Cmp, vit d collected ? ? ?Reviewed expectations re: course of current medical issues. ?Discussed self-management of symptoms. ?Outlined signs and symptoms indicating need for more acute intervention. ?Patient verbalized understanding and all questions were answered. ?Patient received an After-Visit Summary. ? ? ? ?Orders Placed This Encounter  ?Procedures  ? CBC  ? Comp Met (CMET)  ? TSH  ? Hemoglobin A1c  ? Vitamin D (25 hydroxy)  ? PTH, Intact and Calcium  ? Lipid panel  ? ?Meds ordered this encounter  ?Medications  ? DULoxetine (CYMBALTA) 20 MG capsule  ?  Sig: Take 1 capsule (20 mg total) by mouth daily.  ?  Dispense:  30 capsule  ?  Refill:  0  ?  Pt needs appt for any refills.  ? sertraline (ZOLOFT) 25 MG tablet  ?  Sig: Take 1 tablet (25 mg total) by mouth daily.  ?  Dispense:  90 tablet  ?  Refill:  1  ? ?Referral  Orders  ?No referral(s) requested today  ? ? ? ?Note is dictated utilizing voice recognition software. Although note has been proof read prior to signing, occasional typographical errors still can be missed. If any questi

## 2021-09-24 LAB — CBC
HCT: 41.2 % (ref 35.0–45.0)
Hemoglobin: 13.7 g/dL (ref 11.7–15.5)
MCH: 30.9 pg (ref 27.0–33.0)
MCHC: 33.3 g/dL (ref 32.0–36.0)
MCV: 93 fL (ref 80.0–100.0)
MPV: 14.3 fL — ABNORMAL HIGH (ref 7.5–12.5)
Platelets: 167 10*3/uL (ref 140–400)
RBC: 4.43 10*6/uL (ref 3.80–5.10)
RDW: 12.8 % (ref 11.0–15.0)
WBC: 5.2 10*3/uL (ref 3.8–10.8)

## 2021-09-24 LAB — LIPID PANEL
Cholesterol: 127 mg/dL (ref <200)
HDL: 51 mg/dL (ref >=50)
LDL Cholesterol (Calc): 60 mg/dL (calc)
Non-HDL Cholesterol (Calc): 76 mg/dL (calc) (ref <130)
Total CHOL/HDL Ratio: 2.5 (calc) (ref <5.0)
Triglycerides: 81 mg/dL (ref <150)

## 2021-09-24 LAB — COMPREHENSIVE METABOLIC PANEL
AG Ratio: 1.2 (calc) (ref 1.0–2.5)
ALT: 40 U/L — ABNORMAL HIGH (ref 6–29)
AST: 38 U/L — ABNORMAL HIGH (ref 10–35)
Albumin: 3.8 g/dL (ref 3.6–5.1)
Alkaline phosphatase (APISO): 63 U/L (ref 37–153)
BUN/Creatinine Ratio: 20 (calc) (ref 6–22)
BUN: 28 mg/dL — ABNORMAL HIGH (ref 7–25)
CO2: 24 mmol/L (ref 20–32)
Calcium: 9.9 mg/dL (ref 8.6–10.4)
Chloride: 104 mmol/L (ref 98–110)
Creat: 1.4 mg/dL — ABNORMAL HIGH (ref 0.60–1.00)
Globulin: 3.3 g/dL (calc) (ref 1.9–3.7)
Glucose, Bld: 91 mg/dL (ref 65–99)
Potassium: 4.2 mmol/L (ref 3.5–5.3)
Sodium: 139 mmol/L (ref 135–146)
Total Bilirubin: 0.8 mg/dL (ref 0.2–1.2)
Total Protein: 7.1 g/dL (ref 6.1–8.1)

## 2021-09-24 LAB — HEMOGLOBIN A1C
Hgb A1c MFr Bld: 5.8 % of total Hgb — ABNORMAL HIGH (ref <5.7)
Mean Plasma Glucose: 120 mg/dL
eAG (mmol/L): 6.6 mmol/L

## 2021-09-24 LAB — PTH, INTACT AND CALCIUM
Calcium: 9.9 mg/dL (ref 8.6–10.4)
PTH: 38 pg/mL (ref 16–77)

## 2021-09-24 LAB — TSH: TSH: 4.23 mIU/L (ref 0.40–4.50)

## 2021-09-24 LAB — VITAMIN D 25 HYDROXY (VIT D DEFICIENCY, FRACTURES): Vit D, 25-Hydroxy: 87 ng/mL (ref 30–100)

## 2021-10-12 ENCOUNTER — Encounter (HOSPITAL_COMMUNITY): Payer: Medicare Other

## 2021-10-19 ENCOUNTER — Other Ambulatory Visit: Payer: Self-pay | Admitting: Internal Medicine

## 2021-10-19 DIAGNOSIS — I739 Peripheral vascular disease, unspecified: Secondary | ICD-10-CM

## 2021-10-23 ENCOUNTER — Ambulatory Visit (HOSPITAL_BASED_OUTPATIENT_CLINIC_OR_DEPARTMENT_OTHER)
Admission: RE | Admit: 2021-10-23 | Discharge: 2021-10-23 | Disposition: A | Discharge status: Home / Self Care | Payer: Medicare Other | Source: Ambulatory Visit | Attending: Internal Medicine | Admitting: Internal Medicine

## 2021-10-23 ENCOUNTER — Ambulatory Visit (HOSPITAL_COMMUNITY)
Admission: RE | Admit: 2021-10-23 | Discharge: 2021-10-23 | Disposition: A | Discharge status: Home / Self Care | Payer: Medicare Other | Source: Ambulatory Visit | Attending: Internal Medicine | Admitting: Internal Medicine

## 2021-10-23 DIAGNOSIS — I739 Peripheral vascular disease, unspecified: Secondary | ICD-10-CM

## 2021-10-23 DIAGNOSIS — I779 Disorder of arteries and arterioles, unspecified: Secondary | ICD-10-CM | POA: Insufficient documentation

## 2021-10-23 DIAGNOSIS — I6523 Occlusion and stenosis of bilateral carotid arteries: Secondary | ICD-10-CM | POA: Diagnosis not present

## 2021-10-30 ENCOUNTER — Encounter: Payer: Self-pay | Admitting: Internal Medicine

## 2021-10-30 DIAGNOSIS — I10 Essential (primary) hypertension: Secondary | ICD-10-CM

## 2021-10-30 MED ORDER — SPIRONOLACTONE 25 MG PO TABS: 12.5000 mg | ORAL_TABLET | Freq: Every day | ORAL | 1 refills | Status: DC

## 2021-10-31 ENCOUNTER — Other Ambulatory Visit: Payer: Self-pay | Admitting: Internal Medicine

## 2021-10-31 DIAGNOSIS — I779 Disorder of arteries and arterioles, unspecified: Secondary | ICD-10-CM

## 2021-10-31 DIAGNOSIS — I739 Peripheral vascular disease, unspecified: Secondary | ICD-10-CM

## 2021-11-01 ENCOUNTER — Ambulatory Visit (INDEPENDENT_AMBULATORY_CARE_PROVIDER_SITE_OTHER): Payer: Medicare Other

## 2021-11-01 DIAGNOSIS — D6869 Other thrombophilia: Secondary | ICD-10-CM | POA: Diagnosis not present

## 2021-11-01 DIAGNOSIS — Z7901 Long term (current) use of anticoagulants: Secondary | ICD-10-CM

## 2021-11-01 DIAGNOSIS — Z5181 Encounter for therapeutic drug level monitoring: Secondary | ICD-10-CM | POA: Diagnosis not present

## 2021-11-01 DIAGNOSIS — I4891 Unspecified atrial fibrillation: Secondary | ICD-10-CM

## 2021-11-01 LAB — POCT INR: INR: 2.6 (ref 2.0–3.0)

## 2021-11-01 NOTE — Patient Instructions (Signed)
continue to take warfarin 1/2 a tablet daily except for 1 tablet on wednesdays. Recheck INR in 6 weeks. Coumadin Clinic [phone].

## 2021-11-16 ENCOUNTER — Other Ambulatory Visit: Payer: Self-pay | Admitting: Internal Medicine

## 2021-11-16 DIAGNOSIS — I739 Peripheral vascular disease, unspecified: Secondary | ICD-10-CM

## 2021-11-20 ENCOUNTER — Telehealth: Payer: Self-pay | Admitting: Internal Medicine

## 2021-11-20 ENCOUNTER — Other Ambulatory Visit: Payer: Self-pay | Admitting: Internal Medicine

## 2021-11-20 NOTE — Telephone Encounter (Signed)
Spoke to patient and discussed refill.  Verbalized understanding

## 2021-11-20 NOTE — Telephone Encounter (Signed)
Pt c/o medication issue:  1. Name of Medication: warfarin (COUMADIN) 2 MG tablet  2. How are you currently taking this medication (dosage and times per day)? N/A  3. Are you having a reaction (difficulty breathing--STAT)? N/A  4. What is your medication issue? Needing confirmation on how patient is to take this medication so they know how many tablets to supply to last for 90 days.

## 2021-12-13 ENCOUNTER — Ambulatory Visit (INDEPENDENT_AMBULATORY_CARE_PROVIDER_SITE_OTHER): Payer: Medicare Other | Admitting: *Deleted

## 2021-12-13 DIAGNOSIS — Z7901 Long term (current) use of anticoagulants: Secondary | ICD-10-CM | POA: Diagnosis not present

## 2021-12-13 DIAGNOSIS — D6869 Other thrombophilia: Secondary | ICD-10-CM

## 2021-12-13 DIAGNOSIS — I4891 Unspecified atrial fibrillation: Secondary | ICD-10-CM

## 2021-12-13 LAB — POCT INR: INR: 2.6 (ref 2.0–3.0)

## 2021-12-13 NOTE — Patient Instructions (Signed)
Description   Continue taking warfarin 1/2 tablet daily except for 1 tablet on Wednesdays. Recheck INR in 6 weeks. Coumadin Clinic [phone].

## 2021-12-19 ENCOUNTER — Ambulatory Visit (INDEPENDENT_AMBULATORY_CARE_PROVIDER_SITE_OTHER): Payer: Medicare Other

## 2021-12-19 DIAGNOSIS — Z0000 Encounter for general adult medical examination without abnormal findings: Secondary | ICD-10-CM | POA: Diagnosis not present

## 2021-12-19 NOTE — Progress Notes (Signed)
Virtual Visit via Telephone Note  I connected with  Kathy Hobbs on 12/19/21 at  2:00 PM EDT by telephone and verified that I am speaking with the correct person using two identifiers.  Medicare Annual Wellness visit completed telephonically due to Covid-19 pandemic.   Persons participating in this call: This Health Coach and this patient.   Location: Patient: home Provider: office   I discussed the limitations, risks, security and privacy concerns of performing an evaluation and management service by telephone and the availability of in person appointments. The patient expressed understanding and agreed to proceed.  Unable to perform video visit due to video visit attempted and failed and/or patient does not have video capability.   Some vital signs may be absent or patient reported.   Willette Brace, LPN   Subjective:   Kathy Hobbs is a 81 y.o. female who presents for Medicare Annual (Subsequent) preventive examination.  Review of Systems     Cardiac Risk Factors include: advanced age (>51mn, >>49women);dyslipidemia;hypertension     Objective:    There were no vitals filed for this visit. There is no height or weight on file to calculate BMI.     12/19/2021    2:07 PM 11/29/2020    1:00 PM 01/02/2017   10:49 AM  Advanced Directives  Does Patient Have a Medical Advance Directive? No Yes Yes  Type of Advance Directive  [place]  Copy of [place]?   No - copy requested  Would patient like information on creating a medical advance directive? No - Patient declined      Current Medications (verified) Outpatient Encounter Medications as of 12/19/2021  Medication Sig   atorvastatin (LIPITOR) 80 MG tablet Take 1 tablet (80 mg total) by mouth daily.   cholecalciferol (VITAMIN D) 1000 units tablet Take 1,000 Units by mouth daily.   irbesartan (AVAPRO) 300 MG tablet TAKE ONE TABLET BY MOUTH  EVERY DAY FOR BLOOD PRESSURE   Mag Aspart-Potassium Aspart (RA POTASSIUM/MAGNESIUM PO) Take by mouth.   sertraline (ZOLOFT) 25 MG tablet Take 1 tablet (25 mg total) by mouth daily.   spironolactone (ALDACTONE) 25 MG tablet Take 0.5 tablets (12.5 mg total) by mouth daily.   warfarin (COUMADIN) 2 MG tablet TAKE 1/2 TO 1 BY MOUTH AS DIRECTED   [DISCONTINUED] DULoxetine (CYMBALTA) 20 MG capsule Take 1 capsule (20 mg total) by mouth daily.   No facility-administered encounter medications on file as of 12/19/2021.    Allergies (verified) Patient has no known allergies.   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 pt states "Stent in my leg"   RENAL ARTERY STENT Bilateral    Family History  Problem Relation Age of Onset   Heart attack Father    Depression Mother    Alcohol abuse Brother    Breast cancer Paternal Grandmother    Alcoholism Brother    Depression Brother    Social History  Socioeconomic History   Marital status: Married    Spouse name: Not on file   Number of children: Not on file   Years of education: Not on file   Highest education level: Not on file  Occupational History   Not on file  Tobacco Use   Smoking status: Never   Smokeless tobacco: Never  Vaping Use   Vaping Use: Never used  Substance and Sexual Activity   Alcohol use: No    Alcohol/week: 0.0 standard drinks of alcohol   Drug use: No   Sexual activity: Never  Other Topics Concern   Not on file  Social History Narrative   Married. Currently lives with daughter in [place] from [place] secondary to declining  health.    Wears seatbelt, wears bike helmet.    - does not exercise daily.    - takes vitamins.    - Smoke alarm in the home. Guns in the home, locked case.   - feels safe in her relationships.       Epworth Sleepiness Scale = 10 (04/04/2015)      Homemaker      Highest level of education:  High school   Social Determinants of Health   Financial Resource Strain: Low Risk  (12/19/2021)   Overall Financial Resource Strain (CARDIA)    Difficulty of Paying Living Expenses: Not hard at all  Food Insecurity: No Food Insecurity (12/19/2021)   Hunger Vital Sign    Worried About Running Out of Food in the Last Year: Never true    Ran Out of Food in the Last Year: Never true  Transportation Needs: No Transportation Needs (12/19/2021)   PRAPARE - Hydrologist (Medical): No    Lack of Transportation (Non-Medical): No  Physical Activity: Sufficiently Active (12/19/2021)   Exercise Vital Sign    Days of Exercise per Week: 5 days    Minutes of Exercise per Session: 30 min  Stress: No Stress Concern Present (12/19/2021)   [place]    Feeling of Stress : Not at all  Social Connections: Moderately Isolated (12/19/2021)   Social Connection and Isolation Panel [NHANES]    Frequency of Communication with Friends and Family: More than three times a week    Frequency of Social Gatherings with Friends and Family: More than three times a week    Attends Religious Services: Never    Marine scientist or Organizations: No    Attends Music therapist: Never    Marital Status: Married    Tobacco Counseling Counseling given: Not Answered   Clinical Intake:  Pre-visit preparation completed: Yes  Pain : No/denies pain     BMI - recorded: 26.81 Nutritional Status: BMI 25 -29 Overweight Nutritional Risks: None Diabetes: No     Diabetic?no  Interpreter Needed?: No  Information  entered by :: Charlott Rakes, LPN   Activities of Daily Living    12/19/2021    2:09 PM  In your present state of health, do you have any difficulty performing the following activities:  Hearing? 0  Vision? 0  Difficulty concentrating or making decisions? 0  Walking or climbing stairs? 0  Dressing or bathing? 0  Doing errands, shopping? 0  Preparing Food and eating ? N  Using the Toilet? N  In the past six months, have you accidently leaked urine? N  Do you have problems with loss of bowel control? N  Managing your  Medications? N  Managing your Finances? N  Housekeeping or managing your Housekeeping? N    Patient Care Team: Ma Hillock, DO as PCP - General (Family Medicine) Debara Pickett Nadean Corwin, MD as PCP - Cardiology (Cardiology)  Indicate any recent Medical Services you may have received from other than [facility] providers in the past year (date may be approximate).     Assessment:   This is a routine wellness examination for Kathy Hobbs.  Hearing/Vision screen Hearing Screening - Comments:: Pt denies any hearing issues  Vision Screening - Comments:: Pt follows up with Dr for annual eye exams   Dietary issues and exercise activities discussed: Current Exercise Habits: Home exercise routine, Type of exercise: walking, Time (Minutes): 30, Frequency (Times/Week): 5, Weekly Exercise (Minutes/Week): 150   Goals Addressed             This Visit's Progress    Patient Stated       None at this time        Depression Screen    12/19/2021    2:04 PM 09/21/2021    1:07 PM 11/29/2020    1:15 PM 01/31/2020   11:26 AM 10/22/2019    1:57 PM 03/10/2019    3:28 PM 08/28/2018   10:16 AM  PHQ 2/9 Scores  PHQ - 2 Score 0 1 0 0 0 0 0  PHQ- 9 Score  5  0 4 0 0    Fall Risk    12/19/2021    2:08 PM 09/21/2021    1:07 PM 11/29/2020   12:52 PM 09/13/2020    1:43 PM 02/10/2018    2:04 PM  [place] in the past year? '1 1 1 '$ 0 No  Number falls in past yr: 1 0 0 0   Injury with  Fall? 0 0 0 0   Risk for fall due to : Impaired vision      Follow up Falls prevention discussed Falls evaluation completed Falls evaluation completed;Falls prevention discussed Falls evaluation completed     FALL RISK PREVENTION PERTAINING TO THE HOME:  Any stairs in or around the home? Yes  If so, are there any without handrails? No  Home free of loose throw rugs in walkways, pet beds, electrical cords, etc? Yes  Adequate lighting in your home to reduce risk of falls? Yes   ASSISTIVE DEVICES UTILIZED TO PREVENT FALLS:  Life alert? Yes  phone watch  Use of a cane, walker or w/c? No  Grab bars in the bathroom? No  Shower chair or bench in shower? Yes  Elevated toilet seat or a handicapped toilet? No   TIMED UP AND GO:  Was the test performed? No .  Cognitive Function:        12/19/2021    2:11 PM  6CIT Screen  What Year? 0 points  What month? 0 points  What time? 0 points  Count back from 20 0 points  Months in reverse 4 points  Repeat phrase 0 points  Total Score 4 points    Immunizations Immunization History  Administered Date(s) Administered   Influenza, High Dose Seasonal PF 03/07/2021   Influenza,inj,Quad PF,6+ Mos 04/13/2015   Influenza-Unspecified 01/22/2018   PFIZER(Purple Top)SARS-COV-2 Vaccination 07/05/2019, 07/26/2019, 05/08/2020   Tdap 07/13/2015    TDAP status: Up to date  Flu Vaccine status: Up to date  Pneumococcal vaccine status: Declined,  Education has been provided regarding the importance of this vaccine but patient still declined. Advised may receive this  vaccine at local pharmacy or Health Dept. Aware to provide a copy of the vaccination record if obtained from local pharmacy or Health Dept. Verbalized acceptance and understanding.   Covid-19 vaccine status: Completed vaccines  Qualifies for Shingles Vaccine? Yes   Zostavax completed No   Shingrix Completed?: No.    Education has been provided regarding the importance of this vaccine.  Patient has been advised to call insurance company to determine out of pocket expense if they have not yet received this vaccine. Advised may also receive vaccine at local pharmacy or Health Dept. Verbalized acceptance and understanding.  Screening Tests Health Maintenance  Topic Date Due   Zoster Vaccines- Shingrix (1 of 2) 12/21/2021 (Originally 10/15/1959)   Pneumonia Vaccine 12+ Years old (1 - PCV) 09/22/2022 (Originally 10/16/2006)   COVID-19 Vaccine (4 - Booster for [place] series) 10/08/2023 (Originally 07/03/2020)   INFLUENZA VACCINE  01/01/2022   TETANUS/TDAP  07/12/2025   DEXA SCAN  Completed   HPV VACCINES  Aged Out    Health Maintenance  There are no preventive care reminders to display for this patient.  Colorectal cancer screening: No longer required.   Mammogram status: No longer required due to age.     Additional Screening:   Vision Screening: Recommended annual ophthalmology exams for early detection of glaucoma and other disorders of the eye. Is the patient up to date with their annual eye exam?  Yes  Who is the provider or what is the name of the office in which the patient attends annual eye exams? Un sure of providers name  If pt is not established with a provider, would they like to be referred to a provider to establish care? No .   Dental Screening: Recommended annual dental exams for proper oral hygiene  Community Resource Referral / Chronic Care Management: CRR required this visit?  No   CCM required this visit?  No      Plan:     I have personally reviewed and noted the following in the patient's chart:   Medical and social history Use of alcohol, tobacco or illicit drugs  Current medications and supplements including opioid prescriptions.  Functional ability and status Nutritional status Physical activity Advanced directives List of other physicians Hospitalizations, surgeries, and ER visits in previous 12 months Vitals Screenings to  include cognitive, depression, and falls Referrals and appointments  In addition, I have reviewed and discussed with patient certain preventive protocols, quality metrics, and best practice recommendations. A written personalized care plan for preventive services as well as general preventive health recommendations were provided to patient.     Willette Brace, LPN   5/57/3220   Nurse Notes: none

## 2021-12-19 NOTE — Patient Instructions (Signed)
Kathy Hobbs , Thank you for taking time to come for your Medicare Wellness Visit. I appreciate your ongoing commitment to your health goals. Please review the following plan we discussed and let me know if I can assist you in the future.   Screening recommendations/referrals: Colonoscopy: no longer required  Mammogram: no longer required  Bone Density: done 04/17/15 Recommended yearly ophthalmology/optometry visit for glaucoma screening and checkup Recommended yearly dental visit for hygiene and checkup  Vaccinations: Influenza vaccine: done 03/07/21 repeat every year  Pneumococcal vaccine: declined  Tdap vaccine: done 07/13/15 repeat every 10 years  Shingles vaccine: Shingrix discussed. Please contact your pharmacy for coverage information.    Covid-19:completed 2/1, 2/22, 05/08/20  Advanced directives: Please bring a copy of your health care power of attorney and living will to the office at your convenience.  Conditions/risks identified: none at this time   Next appointment: Follow up in one year for your annual wellness visit    Preventive Care 65 Years and Older, Female Preventive care refers to lifestyle choices and visits with your health care provider that can promote health and wellness. What does preventive care include? A yearly physical exam. This is also called an annual well check. Dental exams once or twice a year. Routine eye exams. Ask your health care provider how often you should have your eyes checked. Personal lifestyle choices, including: Daily care of your teeth and gums. Regular physical activity. Eating a healthy diet. Avoiding tobacco and drug use. Limiting alcohol use. Practicing safe sex. Taking low-dose aspirin every day. Taking vitamin and mineral supplements as recommended by your health care provider. What happens during an annual well check? The services and screenings done by your health care provider during your annual well check will depend on  your age, overall health, lifestyle risk factors, and family history of disease. Counseling  Your health care provider may ask you questions about your: Alcohol use. Tobacco use. Drug use. Emotional well-being. Home and relationship well-being. Sexual activity. Eating habits. History of falls. Memory and ability to understand (cognition). Work and work Statistician. Reproductive health. Screening  You may have the following tests or measurements: Height, weight, and BMI. Blood pressure. Lipid and cholesterol levels. These may be checked every 5 years, or more frequently if you are over 26 years old. Skin check. Lung cancer screening. You may have this screening every year starting at age 84 if you have a 30-pack-year history of smoking and currently smoke or have quit within the past 15 years. Fecal occult blood test (FOBT) of the stool. You may have this test every year starting at age 30. Flexible sigmoidoscopy or colonoscopy. You may have a sigmoidoscopy every 5 years or a colonoscopy every 10 years starting at age 37. Hepatitis C blood test. Hepatitis B blood test. Sexually transmitted disease (STD) testing. Diabetes screening. This is done by checking your blood sugar (glucose) after you have not eaten for a while (fasting). You may have this done every 1-3 years. Bone density scan. This is done to screen for osteoporosis. You may have this done starting at age 73. Mammogram. This may be done every 1-2 years. Talk to your health care provider about how often you should have regular mammograms. Talk with your health care provider about your test results, treatment options, and if necessary, the need for more tests. Vaccines  Your health care provider may recommend certain vaccines, such as: Influenza vaccine. This is recommended every year. Tetanus, diphtheria, and acellular pertussis (Tdap, Td)  vaccine. You may need a Td booster every 10 years. Zoster vaccine. You may need this  after age 60. Pneumococcal 13-valent conjugate (PCV13) vaccine. One dose is recommended after age 74. Pneumococcal polysaccharide (PPSV23) vaccine. One dose is recommended after age 46. Talk to your health care provider about which screenings and vaccines you need and how often you need them. This information is not intended to replace advice given to you by your health care provider. Make sure you discuss any questions you have with your health care provider. Document Released: 06/16/2015 Document Revised: 02/07/2016 Document Reviewed: 03/21/2015 Elsevier Interactive Patient Education  2017 [employer] Prevention in the Home Falls can cause injuries. They can happen to people of all ages. There are many things you can do to make your home safe and to help prevent falls. What can I do on the outside of my home? Regularly fix the edges of walkways and driveways and fix any cracks. Remove anything that might make you trip as you walk through a door, such as a raised step or threshold. Trim any bushes or trees on the path to your home. Use bright outdoor lighting. Clear any walking paths of anything that might make someone trip, such as rocks or tools. Regularly check to see if handrails are loose or broken. Make sure that both sides of any steps have handrails. Any raised decks and porches should have guardrails on the edges. Have any leaves, snow, or ice cleared regularly. Use sand or salt on walking paths during winter. Clean up any spills in your garage right away. This includes oil or grease spills. What can I do in the bathroom? Use night lights. Install grab bars by the toilet and in the tub and shower. Do not use towel bars as grab bars. Use non-skid mats or decals in the tub or shower. If you need to sit down in the shower, use a plastic, non-slip stool. Keep the floor dry. Clean up any water that spills on the floor as soon as it happens. Remove soap buildup in the tub or  shower regularly. Attach bath mats securely with double-sided non-slip rug tape. Do not have throw rugs and other things on the floor that can make you trip. What can I do in the bedroom? Use night lights. Make sure that you have a light by your bed that is easy to reach. Do not use any sheets or blankets that are too big for your bed. They should not hang down onto the floor. Have a firm chair that has side arms. You can use this for support while you get dressed. Do not have throw rugs and other things on the floor that can make you trip. What can I do in the kitchen? Clean up any spills right away. Avoid walking on wet floors. Keep items that you use a lot in easy-to-reach places. If you need to reach something above you, use a strong step stool that has a grab bar. Keep electrical cords out of the way. Do not use floor polish or wax that makes floors slippery. If you must use wax, use non-skid floor wax. Do not have throw rugs and other things on the floor that can make you trip. What can I do with my stairs? Do not leave any items on the stairs. Make sure that there are handrails on both sides of the stairs and use them. Fix handrails that are broken or loose. Make sure that handrails are as long  as the stairways. Check any carpeting to make sure that it is firmly attached to the stairs. Fix any carpet that is loose or worn. Avoid having throw rugs at the top or bottom of the stairs. If you do have throw rugs, attach them to the floor with carpet tape. Make sure that you have a light switch at the top of the stairs and the bottom of the stairs. If you do not have them, ask someone to add them for you. What else can I do to help prevent falls? Wear shoes that: Do not have high heels. Have rubber bottoms. Are comfortable and fit you well. Are closed at the toe. Do not wear sandals. If you use a stepladder: Make sure that it is fully opened. Do not climb a closed stepladder. Make  sure that both sides of the stepladder are locked into place. Ask someone to hold it for you, if possible. Clearly mark and make sure that you can see: Any grab bars or handrails. First and last steps. Where the edge of each step is. Use tools that help you move around (mobility aids) if they are needed. These include: Canes. Walkers. Scooters. Crutches. Turn on the lights when you go into a dark area. Replace any light bulbs as soon as they burn out. Set up your furniture so you have a clear path. Avoid moving your furniture around. If any of your floors are uneven, fix them. If there are any pets around you, be aware of where they are. Review your medicines with your doctor. Some medicines can make you feel dizzy. This can increase your chance of falling. Ask your doctor what other things that you can do to help prevent falls. This information is not intended to replace advice given to you by your health care provider. Make sure you discuss any questions you have with your health care provider. Document Released: 03/16/2009 Document Revised: 10/26/2015 Document Reviewed: 06/24/2014 Elsevier Interactive Patient Education  2017 [employer].

## 2022-01-24 ENCOUNTER — Ambulatory Visit (INDEPENDENT_AMBULATORY_CARE_PROVIDER_SITE_OTHER): Payer: Medicare Other

## 2022-01-24 DIAGNOSIS — Z7901 Long term (current) use of anticoagulants: Secondary | ICD-10-CM

## 2022-01-24 DIAGNOSIS — I4891 Unspecified atrial fibrillation: Secondary | ICD-10-CM

## 2022-01-24 DIAGNOSIS — D6869 Other thrombophilia: Secondary | ICD-10-CM

## 2022-01-24 DIAGNOSIS — Z5181 Encounter for therapeutic drug level monitoring: Secondary | ICD-10-CM

## 2022-01-24 LAB — POCT INR: INR: 2 (ref 2.0–3.0)

## 2022-01-24 NOTE — Patient Instructions (Signed)
Continue taking warfarin 1/2 tablet daily except for 1 tablet on Wednesdays. Recheck INR in 6 weeks. [facility] [phone]. Moving back to [place] 9/10 for season, Lab to be drawn

## 2022-02-28 ENCOUNTER — Other Ambulatory Visit: Payer: Self-pay

## 2022-02-28 DIAGNOSIS — I4891 Unspecified atrial fibrillation: Secondary | ICD-10-CM

## 2022-03-06 LAB — PROTIME-INR
INR: 2.6 — ABNORMAL HIGH (ref 0.9–1.2)
Prothrombin Time: 26.7 s — ABNORMAL HIGH (ref 9.1–12.0)

## 2022-03-07 ENCOUNTER — Ambulatory Visit (INDEPENDENT_AMBULATORY_CARE_PROVIDER_SITE_OTHER): Payer: Medicare Other | Admitting: Cardiology

## 2022-03-07 DIAGNOSIS — Z5181 Encounter for therapeutic drug level monitoring: Secondary | ICD-10-CM

## 2022-03-11 ENCOUNTER — Telehealth: Payer: Self-pay | Admitting: Internal Medicine

## 2022-03-11 MED ORDER — IRBESARTAN 300 MG PO TABS: ORAL_TABLET | ORAL | 3 refills | Status: DC

## 2022-03-11 NOTE — Telephone Encounter (Signed)
*  STAT* If patient is at the pharmacy, call can be transferred to refill team.   1. Which medications need to be refilled? (please list name of each medication and dose if known)   irbesartan (AVAPRO) 300 MG tablet    2. Which pharmacy/location (including street and city if local pharmacy) is medication to be sent to?  [facility] [id number] [place], [place] Phone:  [phone]         3. Do they need a 30 day or 90 day supply?  90 day

## 2022-03-27 ENCOUNTER — Telehealth: Payer: Self-pay | Admitting: Internal Medicine

## 2022-03-27 DIAGNOSIS — E785 Hyperlipidemia, unspecified: Secondary | ICD-10-CM

## 2022-03-27 MED ORDER — ATORVASTATIN CALCIUM 80 MG PO TABS: 80.0000 mg | ORAL_TABLET | Freq: Every day | ORAL | 3 refills | Status: DC

## 2022-03-27 NOTE — Telephone Encounter (Signed)
*  STAT* If patient is at the pharmacy, call can be transferred to refill team.   1. Which medications need to be refilled? (please list name of each medication and dose if known)   atorvastatin (LIPITOR) 80 MG tablet  2. Which pharmacy/location (including street and city if local pharmacy) is medication to be sent to?  [facility] [id number] [place], [place]  3. Do they need a 30 day or 90 day supply? 90 day  Husband called stating patient has 3 tablets left.  Husband stated they are currently in [place] and would like the prescription sent to the [facility]

## 2022-04-08 ENCOUNTER — Telehealth: Payer: Self-pay | Admitting: Internal Medicine

## 2022-04-08 ENCOUNTER — Other Ambulatory Visit: Payer: Self-pay

## 2022-04-08 DIAGNOSIS — I10 Essential (primary) hypertension: Secondary | ICD-10-CM

## 2022-04-08 MED ORDER — WARFARIN SODIUM 2 MG PO TABS: ORAL_TABLET | ORAL | 1 refills | Status: DC

## 2022-04-08 MED ORDER — SPIRONOLACTONE 25 MG PO TABS: 12.5000 mg | ORAL_TABLET | Freq: Every day | ORAL | 1 refills | Status: DC

## 2022-04-08 NOTE — Telephone Encounter (Signed)
Pt c/o medication issue:  1. Name of Medication:   warfarin (COUMADIN) 2 MG tablet    2. How are you currently taking this medication (dosage and times per day)?  TAKE 1/2 TO 1 BY MOUTH AS DIRECTED     3. Are you having a reaction (difficulty breathing--STAT)? No  4. What is your medication issue? Pharmacy needing clarification on medication instructions. Please advise

## 2022-04-08 NOTE — Telephone Encounter (Signed)
Refill request for warfarin:  Last INR was 2.6 on 03/07/22 Next INR due 04/18/22 LOV was 09/17/21  Alma Friendly MD  Refill approved.

## 2022-04-08 NOTE — Telephone Encounter (Signed)
*  STAT* If patient is at the pharmacy, call can be transferred to refill team.   1. Which medications need to be refilled? (please list name of each medication and dose if known)   spironolactone (ALDACTONE) 25 MG tablet    warfarin (COUMADIN) 2 MG tablet   2. Which pharmacy/location (including street and city if local pharmacy) is medication to be sent to?   [facility] [address] [place] [address]  [phone]   3. Do they need a 30 day or 90 day supply? [place]

## 2022-04-10 ENCOUNTER — Telehealth: Payer: Self-pay | Admitting: Internal Medicine

## 2022-04-10 NOTE — Telephone Encounter (Signed)
*  STAT* If patient is at the pharmacy, call can be transferred to refill team.   1. Which medications need to be refilled? (please list name of each medication and dose if known) new prescription  for Warfarin- need it sent to pharmacy in [place]- live there half of the year  2. Which pharmacy/location (including street and city if local pharmacy) is medication to be sent to? [facility] [address] [place], [place]- [phone]  3. Do they need a 30 day or 90 day supply? 90 days and refills

## 2022-04-15 ENCOUNTER — Telehealth: Payer: Self-pay | Admitting: Internal Medicine

## 2022-04-15 NOTE — Telephone Encounter (Signed)
Pt c/o medication issue:  1. Name of Medication:   warfarin (COUMADIN) 2 MG tablet    2. How are you currently taking this medication (dosage and times per day)? Take 1 to 2 tablets Daily or as directed by Coumadin Clinic.   3. Are you having a reaction (difficulty breathing--STAT)? No  4. What is your medication issue? Needing clarification on instructions for medication. Pt needing to pickup today. Please advise

## 2022-04-19 ENCOUNTER — Ambulatory Visit (INDEPENDENT_AMBULATORY_CARE_PROVIDER_SITE_OTHER): Payer: Medicare Other | Admitting: Cardiology

## 2022-04-19 DIAGNOSIS — Z5181 Encounter for therapeutic drug level monitoring: Secondary | ICD-10-CM

## 2022-04-19 DIAGNOSIS — D6869 Other thrombophilia: Secondary | ICD-10-CM

## 2022-04-19 LAB — PROTIME-INR
INR: 1.8 — ABNORMAL HIGH (ref 0.9–1.2)
Prothrombin Time: 18.7 s — ABNORMAL HIGH (ref 9.1–12.0)

## 2022-05-23 LAB — PROTIME-INR
INR: 2.5 — ABNORMAL HIGH (ref 0.9–1.2)
Prothrombin Time: 25.2 s — ABNORMAL HIGH (ref 9.1–12.0)

## 2022-05-28 ENCOUNTER — Ambulatory Visit (INDEPENDENT_AMBULATORY_CARE_PROVIDER_SITE_OTHER): Payer: Medicare Other | Admitting: Cardiovascular Disease

## 2022-05-28 DIAGNOSIS — Z5181 Encounter for therapeutic drug level monitoring: Secondary | ICD-10-CM | POA: Diagnosis not present

## 2022-05-28 DIAGNOSIS — D6869 Other thrombophilia: Secondary | ICD-10-CM

## 2022-07-09 ENCOUNTER — Other Ambulatory Visit: Payer: Self-pay

## 2022-07-09 DIAGNOSIS — Z5181 Encounter for therapeutic drug level monitoring: Secondary | ICD-10-CM

## 2022-07-09 DIAGNOSIS — I4891 Unspecified atrial fibrillation: Secondary | ICD-10-CM

## 2022-07-09 LAB — POCT INR

## 2022-07-10 LAB — PROTIME-INR
INR: 2.4 — ABNORMAL HIGH (ref 0.9–1.2)
Prothrombin Time: 24.9 s — ABNORMAL HIGH (ref 9.1–12.0)

## 2022-07-11 ENCOUNTER — Ambulatory Visit (INDEPENDENT_AMBULATORY_CARE_PROVIDER_SITE_OTHER): Payer: Medicare Other

## 2022-07-11 DIAGNOSIS — Z5181 Encounter for therapeutic drug level monitoring: Secondary | ICD-10-CM

## 2022-07-11 NOTE — Patient Instructions (Signed)
Description   Called and spoke with pt's spouse and pt. Instructed to continue taking warfarin 1/2 tablet daily except 1 tablet on Wednesdays.  Recheck INR in 6 weeks.  [facility] [phone]. Moving back to [place] 9/10 for season, Lab to be drawn

## 2022-08-23 ENCOUNTER — Ambulatory Visit (INDEPENDENT_AMBULATORY_CARE_PROVIDER_SITE_OTHER): Payer: Medicare Other | Admitting: Cardiology

## 2022-08-23 DIAGNOSIS — I4891 Unspecified atrial fibrillation: Secondary | ICD-10-CM | POA: Diagnosis not present

## 2022-08-23 DIAGNOSIS — Z5181 Encounter for therapeutic drug level monitoring: Secondary | ICD-10-CM | POA: Diagnosis not present

## 2022-08-23 LAB — PROTIME-INR
INR: 1.9 — ABNORMAL HIGH (ref 0.9–1.2)
Prothrombin Time: 19.3 s — ABNORMAL HIGH (ref 9.1–12.0)

## 2022-08-23 NOTE — Patient Instructions (Signed)
Description   Called and spoke with pt's spouse and pt. Instructed pt to continue to take 1 tablet of warfarin today and then continue taking warfarin 1/2 tablet daily except 1 tablet on Wednesdays.  Recheck INR in 4 weeks.  [facility]in Clinic [phone].

## 2022-09-17 ENCOUNTER — Other Ambulatory Visit: Payer: Self-pay

## 2022-09-17 ENCOUNTER — Telehealth: Payer: Self-pay | Admitting: Internal Medicine

## 2022-09-17 DIAGNOSIS — I4891 Unspecified atrial fibrillation: Secondary | ICD-10-CM

## 2022-09-17 DIAGNOSIS — Z5181 Encounter for therapeutic drug level monitoring: Secondary | ICD-10-CM

## 2022-09-17 DIAGNOSIS — E785 Hyperlipidemia, unspecified: Secondary | ICD-10-CM

## 2022-09-17 MED ORDER — WARFARIN SODIUM 2 MG PO TABS
ORAL_TABLET | ORAL | 0 refills | Status: DC
Start: 2022-09-17 — End: 2022-11-04

## 2022-09-17 MED ORDER — IRBESARTAN 300 MG PO TABS: ORAL_TABLET | ORAL | 3 refills | Status: AC

## 2022-09-17 MED ORDER — ATORVASTATIN CALCIUM 80 MG PO TABS: 80.0000 mg | ORAL_TABLET | Freq: Every day | ORAL | 3 refills | Status: DC

## 2022-09-17 NOTE — Telephone Encounter (Signed)
Lipitor and avapro has been refilled and sent to her local pharmacy. Her coumadin has been sent to the coumadin clinic to be refilled. Patient has been scheduled for her annual appointment.

## 2022-09-17 NOTE — Telephone Encounter (Signed)
*  STAT* If patient is at the pharmacy, call can be transferred to refill team.   1. Which medications need to be refilled? (please list name of each medication and dose if known)   atorvastatin (LIPITOR) 80 MG tablet  irbesartan (AVAPRO) 300 MG tablet   warfarin (COUMADIN) 2 MG tablet    2. Which pharmacy/location (including street and city if local pharmacy) is medication to be sent to?  [id number] - [place], [place] - [address]    3. Do they need a 30 day or 90 day supply? 90 day supply

## 2022-09-20 ENCOUNTER — Ambulatory Visit: Payer: Medicare Other | Attending: Cardiology

## 2022-09-20 DIAGNOSIS — D6869 Other thrombophilia: Secondary | ICD-10-CM | POA: Diagnosis present

## 2022-09-20 DIAGNOSIS — Z7901 Long term (current) use of anticoagulants: Secondary | ICD-10-CM | POA: Diagnosis present

## 2022-09-20 DIAGNOSIS — I4891 Unspecified atrial fibrillation: Secondary | ICD-10-CM | POA: Diagnosis present

## 2022-09-20 LAB — POCT INR: INR: 2.2 (ref 2.0–3.0)

## 2022-09-20 NOTE — Patient Instructions (Signed)
Description   Continue taking warfarin 1/2 tablet daily except 1 tablet on Wednesdays.  Recheck INR in 6 weeks.  C[facility]in Clinic [phone].

## 2022-09-24 ENCOUNTER — Encounter: Payer: Self-pay | Admitting: Internal Medicine

## 2022-09-24 ENCOUNTER — Ambulatory Visit: Payer: Medicare Other | Attending: Internal Medicine | Admitting: Internal Medicine

## 2022-09-24 VITALS — BP 135/76 | HR 70 | Ht 66.0 in | Wt 153.8 lb

## 2022-09-24 DIAGNOSIS — I739 Peripheral vascular disease, unspecified: Secondary | ICD-10-CM | POA: Insufficient documentation

## 2022-09-24 DIAGNOSIS — Z7901 Long term (current) use of anticoagulants: Secondary | ICD-10-CM | POA: Insufficient documentation

## 2022-09-24 DIAGNOSIS — I4891 Unspecified atrial fibrillation: Secondary | ICD-10-CM | POA: Insufficient documentation

## 2022-09-24 NOTE — Patient Instructions (Signed)
Medication Instructions:  Your physician recommends that you continue on your current medications as directed. Please refer to the Current Medication list given to you today.  *If you need a refill on your cardiac medications before your next appointment, please call your pharmacy*   Follow-Up: At [facility], you and your health needs are our priority.  As part of our continuing mission to provide you with exceptional heart care, we have created designated Provider Care Teams.  These Care Teams include your primary Cardiologist (physician) and Advanced Practice Providers (APPs -  Physician Assistants and Nurse Practitioners) who all work together to provide you with the care you need, when you need it.  We recommend signing up for the patient portal called "MyChart".  Sign up information is provided on this After Visit Summary.  MyChart is used to connect with patients for Virtual Visits (Telemedicine).  Patients are able to view lab/test results, encounter notes, upcoming appointments, etc.  Non-urgent messages can be sent to your provider as well.   To learn more about what you can do with MyChart, go to [email or web address].    Your next appointment:   12 month(s)  Provider:   Kenneth C Hilty, MD   

## 2022-09-24 NOTE — Progress Notes (Signed)
OFFICE NOTE  Chief Complaint:  Follow-up   Primary Care Physician: Natalia Leatherwood, DO  HPI:  Kathy Hobbs is a pleasant 82 year old female who is kindly referred to me for establishing cardiac care. Kathy Hobbs has not previously seen a cardiologist, but has had several years of atrial fibrillation. Per her report she says it comes and goes although she was noted to be in A. fib today. She was placed on warfarin by her primary care provider in [place] prior to moving up to live with her daughter. Her INR was recently checked by her PCP at 3.1 and mild adjustments were made to her medicines. It was requested that we follow her warfarin and manage it in our office. Kathy Hobbs also has a significant history of PAD. She recently had carotid Dopplers apparently by her neurologist in [place] which showed a 40% right carotid artery stenosis. She also has a history of PAD and peripheral stents as well as bilateral renal artery stenosis status post PCI about one year ago by Dr. Laure Kidney in [facility]. She was previously seeing Dr. Fraser Din, with the [facility] in [place]. She currently reports fatigue and some shortness of breath with exertion. She denies any chest pain. She's never had an ischemia evaluation to her knowledge. Recently he's been having some falls and in fact fell and fractured her right humerus. That is very slow to heal. She is also reporting some pain when walking and swelling of her legs.  Kathy Hobbs returns today for follow-up. Unfortunately we have not been able to locate records from her cardiologist in [facility] She subsequently underwent lower extremity arterial Dopplers as well as renal artery Dopplers here. This demonstrated patent lower extremity stents as well as patent bilateral renal stents. She underwent stress testing as well which demonstrated no ischemia and LVEF which was preserved. She seems to be tolerating warfarin without  bleeding problems although has been having some falls. In fact she fell twice last week. I am concerned about this and her daughter apparently is working on trying to come up with the cause of her falls. Again in the office today, we discussed the possibility of a Watchman device which is a left atrial appendage occluder if she were not to be candidate for warfarin therapy long-term.   06/27/2016  Kathy Hobbs returns today for follow-up. She's done well over the last year. She recently had carotid and lower extremity arterial Dopplers in December which were stable. She does have a right carotid bruit. She reports that her falls have improved significantly. She saw a neurologist a couple times and went through some therapy. I think it safe for her to continue on warfarin for now. INR was slightly had arranged today and will be adjusted. She has been taking atorvastatin and is well overdue for repeat lipid profile. Was ordered last year but never obtained. Although she is not fasting today, we'll send her for lab work. There has been about 11 pound weight gain. I counseled her on trying to cut back on food intake and more exercise.  09/17/2021  Kathy Hobbs returns today for follow-up.  She has been spending 6 months a [place] in [place] [address] and otherwise resides in [place].  It has been a while since have seen her in the office.  She needs refills of her atorvastatin and irbesartan.  She is on warfarin but is contemplating switching to a direct oral anticoagulant.  She does want to  research the cost first.  She denies any chest pain or shortness of breath.  She has a history of carotid artery stenosis but has not had reimaging of that since 2019.  She is also had a prior iliac stent and has not had lower extremity arterial Dopplers in several years.  09/24/2022  Kathy Hobbs returns today for follow-up.  Overall she seems to be doing well.  Her husband is concerned about her lack of  interest in activities.  She apparently has little energy but also little motivation to be active.  She denies any claudication.  It was noted that she had an increase in the right SFA velocities last year on Dopplers which were scheduled to occur again in May.  Her last lipids a year ago showed an LDL of 60.  She has an upcoming physical with her primary care provider on May 1.  We again discussed the possibility of coming off warfarin for Eliquis however she prefers to remain on warfarin.  PMHx:  Past Medical History:  Diagnosis Date   Atrial fibrillation    Breast cancer    Depression    Hemorrhoid 03/03/2015   Hyperlipidemia    Hypertension    Right humeral fracture 2016   Skin cancer of nose    Unknown type, patient reports needed graft   Urine incontinence    Vertigo     Past Surgical History:  Procedure Laterality Date   BREAST BIOPSY     BREAST LUMPECTOMY     FEMORAL ARTERY STENT     ? pt states "Stent in my leg"   RENAL ARTERY STENT Bilateral     FAMHx:  Family History  Problem Relation Age of Onset   Heart attack Father    Depression Mother    Alcohol abuse Brother    Breast cancer Paternal Grandmother    Alcoholism Brother    Depression Brother     SOCHx:   reports that she has never smoked. She has never used smokeless tobacco. She reports that she does not drink alcohol and does not use drugs.  ALLERGIES:  No Known Allergies  ROS: A comprehensive review of systems was negative.  HOME MEDS: Current Outpatient Medications  Medication Sig Dispense Refill   atorvastatin (LIPITOR) 80 MG tablet Take 1 tablet (80 mg total) by mouth daily. 90 tablet 3   cholecalciferol (VITAMIN D) 1000 units tablet Take 1,000 Units by mouth daily.     irbesartan (AVAPRO) 300 MG tablet TAKE ONE TABLET BY MOUTH EVERY DAY FOR BLOOD PRESSURE 90 tablet 3   Mag Aspart-Potassium Aspart (RA POTASSIUM/MAGNESIUM PO) Take by mouth.     sertraline (ZOLOFT) 25 MG tablet Take 1 tablet (25  mg total) by mouth daily. 90 tablet 1   spironolactone (ALDACTONE) 25 MG tablet Take 0.5 tablets (12.5 mg total) by mouth daily. 45 tablet 1   warfarin (COUMADIN) 2 MG tablet Take 1 to 2 tablets Daily or as directed by Coumadin Clinic. 100 tablet 0   No current facility-administered medications for this visit.    LABS/IMAGING: No results found for this or any previous visit (from the past 48 hour(s)). No results found.  WEIGHTS: Wt Readings from Last 3 Encounters:  09/24/22 153 lb 12.8 oz (69.8 kg)  09/21/21 166 lb (75.3 kg)  09/17/21 168 lb 12.8 oz (76.6 kg)    VITALS: BP 135/76   Pulse 70   Ht  (1.676 m)   Wt 153 lb 12.8 oz (69.8 kg)  SpO2 100%   BMI 24.82 kg/m   EXAM: General appearance: alert and no distress Neck: no carotid bruit and no JVD Lungs: clear to auscultation bilaterally Heart: irregularly irregular rhythm Abdomen: soft, non-tender; bowel sounds normal; no masses,  no organomegaly Extremities: extremities normal, atraumatic, no cyanosis or edema Pulses: 2+ and symmetric Skin: Skin color, texture, turgor normal. No rashes or lesions Neurologic: Grossly normal Psych: Pleasant  EKG: A-fib at 70, nonspecific ST and T wave changes-personally reviewed  ASSESSMENT: Atrial fibrillation possibly paroxysmal, asymptomatic- CHADSVASC 3 Hypertension-controlled PAD-status post peripheral stenting - normal ABI's and patent stents (04/2015) Bilateral renal artery stenosis status post bilateral stenting (patent stents 04/2015) Carotid artery disease with 40% R ICA stenosis History of dyslipidemia-on lipitor History of falls - improved with therapy  PLAN: 1.   Kathy Hobbs is not really complaining of any new issues.  She is in A-fib which is rate control.  She prefers to remain on warfarin.  She does have PAD with an increase in her right SFA velocities last year but no claudication.  Maybe this is because she is not that active.  Plan is to repeat lower  extremity Dopplers in May which are already ordered.  She has follow-up with her PCP on May 1.  Follow-up with me annually or sooner as necessary.  Chrystie Nose, MD, [facility], FACP  [place]  [facility]  Medical Director of the Advanced Lipid Disorders &  Cardiovascular Risk Reduction Clinic Diplomate of the American Board of Clinical Lipidology Attending Cardiologist  Direct Dial: [phone]  Fax: [phone]  Website:  [email or web address] 09/24/2022, 4:29 PM

## 2022-10-02 ENCOUNTER — Ambulatory Visit (INDEPENDENT_AMBULATORY_CARE_PROVIDER_SITE_OTHER): Payer: Medicare Other | Admitting: Family Medicine

## 2022-10-02 ENCOUNTER — Encounter: Payer: Self-pay | Admitting: Family Medicine

## 2022-10-02 VITALS — BP 133/80 | HR 75 | Temp 98.3°F | Ht 66.0 in | Wt 154.8 lb

## 2022-10-02 DIAGNOSIS — F32A Depression, unspecified: Secondary | ICD-10-CM | POA: Diagnosis not present

## 2022-10-02 DIAGNOSIS — Z23 Encounter for immunization: Secondary | ICD-10-CM

## 2022-10-02 DIAGNOSIS — R7309 Other abnormal glucose: Secondary | ICD-10-CM

## 2022-10-02 DIAGNOSIS — E559 Vitamin D deficiency, unspecified: Secondary | ICD-10-CM | POA: Diagnosis not present

## 2022-10-02 DIAGNOSIS — E785 Hyperlipidemia, unspecified: Secondary | ICD-10-CM

## 2022-10-02 DIAGNOSIS — N1832 Chronic kidney disease, stage 3b: Secondary | ICD-10-CM

## 2022-10-02 DIAGNOSIS — I4891 Unspecified atrial fibrillation: Secondary | ICD-10-CM

## 2022-10-02 DIAGNOSIS — R5383 Other fatigue: Secondary | ICD-10-CM

## 2022-10-02 DIAGNOSIS — D6869 Other thrombophilia: Secondary | ICD-10-CM

## 2022-10-02 DIAGNOSIS — I10 Essential (primary) hypertension: Secondary | ICD-10-CM

## 2022-10-02 NOTE — Progress Notes (Signed)
Kathy Hobbs , 10-Jan-1941, 82 y.o., female MRN: [id number] Patient Care Team    Relationship Specialty Notifications Start End  Natalia Leatherwood, DO PCP - General Family Medicine  02/28/15   Chrystie Nose, MD PCP - Cardiology Cardiology Admissions 12/25/17     Chief Complaint  Patient presents with   Depression    CMC; pt is not fasting     Subjective: Pt presents for an OV for [facility]   Essential hypertension, benign Managed by cardiology  She reports she not been taking zoloft and doe snot require it any longer.  Her husband reports she will not get up and be active. She reports she likes to be "lazy." She likes watching TV. She reports she feels weak with exercising.      10/02/2022    1:05 PM 12/19/2021    2:04 PM 09/21/2021    1:07 PM 11/29/2020    1:15 PM 01/31/2020   11:26 AM  Depression screen PHQ 2/9  Decreased Interest 2 0 1 0 0  Down, Depressed, Hopeless 0 0 0 0 0  PHQ - 2 Score 2 0 1 0 0  Altered sleeping 0  1  0  Tired, decreased energy 3    0  Change in appetite 0  3  0  Feeling bad or failure about yourself  0  0  0  Trouble concentrating 0  0  0  Moving slowly or fidgety/restless 0  0  0  Suicidal thoughts 0  0  0  PHQ-9 Score 5  5  0    No Known Allergies Social History   Social History Narrative   Married. Currently lives with daughter in [place] from [place] secondary to declining health.    Wears seatbelt, wears bike helmet.    - does not exercise daily.    - takes vitamins.    - Smoke alarm in the home. Guns in the home, locked case.   - feels safe in her relationships.       Epworth Sleepiness Scale = 10 (04/04/2015)      Homemaker      Highest level of education:  High school   Past Medical History:  Diagnosis Date   Atrial fibrillation (HCC)    Breast cancer (HCC)    Depression    Hemorrhoid 03/03/2015   Hyperlipidemia    Hypertension    Right humeral fracture 2016   Skin cancer of nose    Unknown type, patient reports  needed graft   Urine incontinence    Vertigo    Past Surgical History:  Procedure Laterality Date   BREAST BIOPSY     BREAST LUMPECTOMY     FEMORAL ARTERY STENT     ? pt states "Stent in my leg"   RENAL ARTERY STENT Bilateral    Family History  Problem Relation Age of Onset   Heart attack Father    Depression Mother    Alcohol abuse Brother    Breast cancer Paternal Grandmother    Alcoholism Brother    Depression Brother    Allergies as of 10/02/2022   No Known Allergies      Medication List        Accurate as of Oct 02, 2022  1:11 PM. If you have any questions, ask your nurse or doctor.          STOP taking these medications    cholecalciferol 1000 units tablet Commonly known as: VITAMIN D Stopped  by: Felix Pacini, DO       TAKE these medications    atorvastatin 80 MG tablet Commonly known as: LIPITOR Take 1 tablet (80 mg total) by mouth daily.   irbesartan 300 MG tablet Commonly known as: AVAPRO TAKE ONE TABLET BY MOUTH EVERY DAY FOR BLOOD PRESSURE   RA POTASSIUM/MAGNESIUM PO Take by mouth.   sertraline 25 MG tablet Commonly known as: ZOLOFT Take 1 tablet (25 mg total) by mouth daily.   spironolactone 25 MG tablet Commonly known as: ALDACTONE Take 0.5 tablets (12.5 mg total) by mouth daily.   warfarin 2 MG tablet Commonly known as: COUMADIN Take as directed by the anticoagulation clinic. If you are unsure how to take this medication, talk to your nurse or doctor. Original instructions: Take 1 to 2 tablets Daily or as directed by Coumadin Clinic.        All past medical history, surgical history, allergies, family history, immunizations andmedications were updated in the EMR today and reviewed under the history and medication portions of their EMR.     ROS: Negative, with the exception of above mentioned in HPI   Objective:  BP 133/80   Pulse 75   Temp 98.3 F (36.8 C)   Ht 5\' 6"  (1.676 m)   Wt 154 lb 12.8 oz (70.2 kg)   SpO2 99%    BMI 24.99 kg/m  Body mass index is 24.99 kg/m. Physical Exam Vitals and nursing note reviewed.  Constitutional:      General: She is not in acute distress.    Appearance: Normal appearance. She is not ill-appearing, toxic-appearing or diaphoretic.  HENT:     Head: Normocephalic and atraumatic.     Mouth/Throat:     Mouth: Mucous membranes are moist.  Eyes:     General: No scleral icterus.       Right eye: No discharge.        Left eye: No discharge.     Extraocular Movements: Extraocular movements intact.     Conjunctiva/sclera: Conjunctivae normal.     Pupils: Pupils are equal, round, and reactive to light.  Cardiovascular:     Rate and Rhythm: Normal rate. Rhythm irregular.     Heart sounds: No murmur heard. Pulmonary:     Effort: Pulmonary effort is normal. No respiratory distress.     Breath sounds: Normal breath sounds. No wheezing, rhonchi or rales.  Musculoskeletal:     Cervical back: Neck supple. No tenderness.     Right lower leg: No edema.     Left lower leg: No edema.  Lymphadenopathy:     Cervical: No cervical adenopathy.  Skin:    General: Skin is warm and dry.     Coloration: Skin is not jaundiced or pale.     Findings: No erythema or rash.  Neurological:     Mental Status: She is alert and oriented to person, place, and time. Mental status is at baseline.     Motor: No weakness.     Gait: Gait normal.  Psychiatric:        Mood and Affect: Mood normal.        Behavior: Behavior normal.        Thought Content: Thought content normal.        Judgment: Judgment normal.     No results found. No results found. No results found for this or any previous visit (from the past 24 hour(s)).  Assessment/Plan: Kathy Hobbs is a 82 y.o. female present for  OV for  Depression, unspecified depression type Stable No medications now  Essential hypertension, benign/Atrial fibrillation, unspecified type (HCC)/Dyslipidemia/ Long term (current) use of  anticoagulants [Z79.01]/PAD (peripheral artery disease) (HCC) medications managed by cardiology CBC, CMP, TSH and lipids collected today Image studies and meds managed by cardio.  Continue routine follow-ups with cardiology  Stage 3 chronic kidney disease, unspecified whether stage 3a or 3b CKD (HCC)/Vitamin D deficiency/Osteoporosis, unspecified osteoporosis type, unspecified pathological fracture presence Renal dose meds when appropriate Avoid nsaids.  CMP and vitamin D collected today  PNA vaccine declined.   Patient was encouraged to exercise daily. Starting slow. Husband is supportive of this for her and walks with her. Her weakness is likely deconditioning.    Reviewed expectations re: course of current medical issues. Discussed self-management of symptoms. Outlined signs and symptoms indicating need for more acute intervention. Patient verbalized understanding and all questions were answered. Patient received an After-Visit Summary.    Orders Placed This Encounter  Procedures   CBC with Differential/Platelet   Comprehensive metabolic panel   Hemoglobin A1c   Lipid panel   TSH   Vitamin D (25 hydroxy)   PTH, Intact and Calcium   No orders of the defined types were placed in this encounter.  Referral Orders  No referral(s) requested today     Note is dictated utilizing voice recognition software. Although note has been proof read prior to signing, occasional typographical errors still can be missed. If any questions arise, please do not hesitate to call for verification.   electronically signed by:  Felix Pacini, DO  [facility] [place]

## 2022-10-02 NOTE — Patient Instructions (Signed)
No follow-ups on file.        Great to see you today.  I have refilled the medication(s) we provide.   If labs were collected, we will inform you of lab results once received either by echart message or telephone call.   - echart message- for normal results that have been seen by the patient already.   - telephone call: abnormal results or if patient has not viewed results in their echart.  

## 2022-10-03 LAB — LIPID PANEL
Cholesterol: 125 mg/dL (ref <200)
HDL: 51 mg/dL (ref >=50)
LDL Cholesterol (Calc): 58 mg/dL (calc)
Non-HDL Cholesterol (Calc): 74 mg/dL (calc) (ref <130)
Total CHOL/HDL Ratio: 2.5 (calc) (ref <5.0)
Triglycerides: 75 mg/dL (ref <150)

## 2022-10-03 LAB — HEMOGLOBIN A1C
Hgb A1c MFr Bld: 6.1 % of total Hgb — ABNORMAL HIGH (ref <5.7)
Mean Plasma Glucose: 128 mg/dL
eAG (mmol/L): 7.1 mmol/L

## 2022-10-03 LAB — COMPREHENSIVE METABOLIC PANEL
AG Ratio: 1.5 (calc) (ref 1.0–2.5)
ALT: 31 U/L — ABNORMAL HIGH (ref 6–29)
AST: 32 U/L (ref 10–35)
Albumin: 4 g/dL (ref 3.6–5.1)
Alkaline phosphatase (APISO): 78 U/L (ref 37–153)
BUN/Creatinine Ratio: 27 (calc) — ABNORMAL HIGH (ref 6–22)
BUN: 33 mg/dL — ABNORMAL HIGH (ref 7–25)
CO2: 25 mmol/L (ref 20–32)
Calcium: 9.6 mg/dL (ref 8.6–10.4)
Chloride: 105 mmol/L (ref 98–110)
Creat: 1.23 mg/dL — ABNORMAL HIGH (ref 0.60–0.95)
Globulin: 2.7 g/dL (calc) (ref 1.9–3.7)
Glucose, Bld: 81 mg/dL (ref 65–99)
Potassium: 4.1 mmol/L (ref 3.5–5.3)
Sodium: 139 mmol/L (ref 135–146)
Total Bilirubin: 0.8 mg/dL (ref 0.2–1.2)
Total Protein: 6.7 g/dL (ref 6.1–8.1)

## 2022-10-03 LAB — CBC WITH DIFFERENTIAL/PLATELET
Absolute Monocytes: 505 cells/uL (ref 200–950)
Basophils Absolute: 39 cells/uL (ref 0–200)
Basophils Relative: 0.8 %
Eosinophils Absolute: 98 cells/uL (ref 15–500)
Eosinophils Relative: 2 %
HCT: 41.7 % (ref 35.0–45.0)
Hemoglobin: 14.2 g/dL (ref 11.7–15.5)
Lymphs Abs: 1181 cells/uL (ref 850–3900)
MCH: 30.3 pg (ref 27.0–33.0)
MCHC: 34.1 g/dL (ref 32.0–36.0)
MCV: 89.1 fL (ref 80.0–100.0)
MPV: 14.6 fL — ABNORMAL HIGH (ref 7.5–12.5)
Monocytes Relative: 10.3 %
Neutro Abs: 3077 cells/uL (ref 1500–7800)
Neutrophils Relative %: 62.8 %
Platelets: 161 10*3/uL (ref 140–400)
RBC: 4.68 10*6/uL (ref 3.80–5.10)
RDW: 13.2 % (ref 11.0–15.0)
Total Lymphocyte: 24.1 %
WBC: 4.9 10*3/uL (ref 3.8–10.8)

## 2022-10-03 LAB — PTH, INTACT AND CALCIUM
Calcium: 9.5 mg/dL (ref 8.6–10.4)
PTH: 66 pg/mL (ref 16–77)

## 2022-10-03 LAB — TSH: TSH: 4.11 mIU/L (ref 0.40–4.50)

## 2022-10-03 LAB — VITAMIN D 25 HYDROXY (VIT D DEFICIENCY, FRACTURES): Vit D, 25-Hydroxy: 58 ng/mL (ref 30–100)

## 2022-10-04 ENCOUNTER — Telehealth: Payer: Self-pay | Admitting: Internal Medicine

## 2022-10-04 DIAGNOSIS — I10 Essential (primary) hypertension: Secondary | ICD-10-CM

## 2022-10-04 MED ORDER — SPIRONOLACTONE 25 MG PO TABS
12.5000 mg | ORAL_TABLET | Freq: Every day | ORAL | 3 refills | Status: AC
Start: 2022-10-04 — End: ?

## 2022-10-04 NOTE — Telephone Encounter (Signed)
*  STAT* If patient is at the pharmacy, call can be transferred to refill team.   1. Which medications need to be refilled? (please list name of each medication and dose if known)   spironolactone (ALDACTONE) 25 MG tablet   2. Which pharmacy/location (including street and city if local pharmacy) is medication to be sent to?  [id number] - , [place] - [address]   3. Do they need a 30 day or 90 day supply?  90 day   Patient stated is almost out of this medication.

## 2022-10-04 NOTE — Telephone Encounter (Signed)
Patient was notified that her refill is being sent to the pharmacy.

## 2022-10-24 ENCOUNTER — Ambulatory Visit (HOSPITAL_COMMUNITY)
Admission: RE | Admit: 2022-10-24 | Discharge: 2022-10-24 | Disposition: A | Discharge status: Home / Self Care | Payer: Medicare Other | Source: Ambulatory Visit | Attending: Cardiology | Admitting: Cardiology

## 2022-10-24 ENCOUNTER — Ambulatory Visit (HOSPITAL_BASED_OUTPATIENT_CLINIC_OR_DEPARTMENT_OTHER)
Admission: RE | Admit: 2022-10-24 | Discharge: 2022-10-24 | Disposition: A | Discharge status: Home / Self Care | Payer: Medicare Other | Source: Ambulatory Visit | Attending: Internal Medicine | Admitting: Internal Medicine

## 2022-10-24 DIAGNOSIS — I779 Disorder of arteries and arterioles, unspecified: Secondary | ICD-10-CM

## 2022-10-24 DIAGNOSIS — I6523 Occlusion and stenosis of bilateral carotid arteries: Secondary | ICD-10-CM | POA: Diagnosis not present

## 2022-10-24 DIAGNOSIS — I739 Peripheral vascular disease, unspecified: Secondary | ICD-10-CM

## 2022-10-24 LAB — VAS US ABI WITH/WO TBI: Right ABI: 1.07

## 2022-10-25 LAB — VAS US ABI WITH/WO TBI: Left ABI: 0.99

## 2022-10-29 ENCOUNTER — Other Ambulatory Visit: Payer: Self-pay | Admitting: *Deleted

## 2022-10-29 DIAGNOSIS — I739 Peripheral vascular disease, unspecified: Secondary | ICD-10-CM

## 2022-10-29 DIAGNOSIS — I779 Disorder of arteries and arterioles, unspecified: Secondary | ICD-10-CM

## 2022-10-31 ENCOUNTER — Ambulatory Visit: Payer: Medicare Other | Attending: Cardiology

## 2022-10-31 DIAGNOSIS — Z7901 Long term (current) use of anticoagulants: Secondary | ICD-10-CM | POA: Insufficient documentation

## 2022-10-31 DIAGNOSIS — I4891 Unspecified atrial fibrillation: Secondary | ICD-10-CM | POA: Insufficient documentation

## 2022-10-31 LAB — POCT INR: INR: 2.4 (ref 2.0–3.0)

## 2022-10-31 NOTE — Patient Instructions (Signed)
Description   Continue taking warfarin 1/2 tablet daily except 1 tablet on Wednesdays.  Recheck INR in 6 weeks.  Coumadin Clinic [phone].     

## 2022-11-03 ENCOUNTER — Other Ambulatory Visit: Payer: Self-pay | Admitting: Cardiology

## 2022-11-03 DIAGNOSIS — I4891 Unspecified atrial fibrillation: Secondary | ICD-10-CM

## 2022-11-03 DIAGNOSIS — Z5181 Encounter for therapeutic drug level monitoring: Secondary | ICD-10-CM

## 2022-12-16 ENCOUNTER — Ambulatory Visit: Payer: Medicare Other | Attending: Internal Medicine

## 2022-12-16 DIAGNOSIS — D6869 Other thrombophilia: Secondary | ICD-10-CM

## 2022-12-16 DIAGNOSIS — I4891 Unspecified atrial fibrillation: Secondary | ICD-10-CM

## 2022-12-16 DIAGNOSIS — Z7901 Long term (current) use of anticoagulants: Secondary | ICD-10-CM

## 2022-12-16 LAB — POCT INR: INR: 2.8 (ref 2.0–3.0)

## 2022-12-16 NOTE — Patient Instructions (Addendum)
Continue taking warfarin 1/2 tablet daily except 1 tablet on Wednesdays.  Recheck INR in 5 weeks.  Coumadin Clinic [phone].

## 2022-12-23 ENCOUNTER — Other Ambulatory Visit: Payer: Self-pay

## 2022-12-23 DIAGNOSIS — I4891 Unspecified atrial fibrillation: Secondary | ICD-10-CM

## 2022-12-23 DIAGNOSIS — Z5181 Encounter for therapeutic drug level monitoring: Secondary | ICD-10-CM

## 2022-12-23 MED ORDER — WARFARIN SODIUM 2 MG PO TABS
ORAL_TABLET | ORAL | 0 refills | Status: DC
Start: 2022-12-23 — End: 2023-02-11

## 2023-01-01 ENCOUNTER — Ambulatory Visit (INDEPENDENT_AMBULATORY_CARE_PROVIDER_SITE_OTHER): Payer: Medicare Other

## 2023-01-01 VITALS — Wt 150.0 lb

## 2023-01-01 DIAGNOSIS — Z0000 Encounter for general adult medical examination without abnormal findings: Secondary | ICD-10-CM | POA: Diagnosis not present

## 2023-01-01 NOTE — Progress Notes (Signed)
Subjective:   Kathy Hobbs is a 82 y.o. female who presents for Medicare Annual (Subsequent) preventive examination.  Vital Signs: Unable to obtain new vitals due to this being a telehealth visit.   Visit Complete: Virtual  I connected with  Kathy Hobbs on 01/01/23 by a audio enabled telemedicine application and verified that I am speaking with the correct person using two identifiers.  Patient Location: Home  Provider Location: Home Office  I discussed the limitations of evaluation and management by telemedicine. The patient expressed understanding and agreed to proceed.   Review of Systems     Cardiac Risk Factors include: advanced age (>33men, >59 women);hypertension;dyslipidemia     Objective:    Today's Vitals   01/01/23 1331  Weight: 150 lb (68 kg)   Body mass index is 24.21 kg/m.     01/01/2023    1:38 PM 12/19/2021    2:07 PM 11/29/2020    1:00 PM 01/02/2017   10:49 AM  Advanced Directives  Does Patient Have a Medical Advance Directive? No No Yes Yes  Type of Advance Directive   Living will Healthcare Power of [place];Living will  Copy of Healthcare Power of Attorney in Chart?    No - copy requested  Would patient like information on creating a medical advance directive? No - Patient declined No - Patient declined      Current Medications (verified) Outpatient Encounter Medications as of 01/01/2023  Medication Sig   atorvastatin (LIPITOR) 80 MG tablet Take 1 tablet (80 mg total) by mouth daily.   irbesartan (AVAPRO) 300 MG tablet TAKE ONE TABLET BY MOUTH EVERY DAY FOR BLOOD PRESSURE   Mag Aspart-Potassium Aspart (RA POTASSIUM/MAGNESIUM PO) Take by mouth.   sertraline (ZOLOFT) 25 MG tablet Take 1 tablet (25 mg total) by mouth daily.   spironolactone (ALDACTONE) 25 MG tablet Take 0.5 tablets (12.5 mg total) by mouth daily.   warfarin (COUMADIN) 2 MG tablet TAKE 1 TO 2 TABLETS BY MOUTH DAILY OR AS DIRECTED BY COUMADIN CLINIC   No  facility-administered encounter medications on file as of 01/01/2023.    Allergies (verified) Patient has no known allergies.   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 pt states "Stent in my leg"   RENAL ARTERY STENT Bilateral    Family History  Problem Relation Age of Onset   Heart attack Father    Depression Mother    Alcohol abuse Brother    Breast cancer Paternal Grandmother    Alcoholism Brother    Depression Brother    Social History   Socioeconomic History   Marital status: Married    Spouse name: Not on file   Number of children: Not on file   Years of education: Not on file   Highest education level: Not on file  Occupational History   Not on file  Tobacco Use   Smoking status: Never   Smokeless tobacco: Never  Vaping Use   Vaping status: Never Used  Substance  and Sexual Activity   Alcohol use: No    Alcohol/week: 0.0 standard drinks of alcohol   Drug use: No   Sexual activity: Never  Other Topics Concern   Not on file  Social History Narrative   Married. Currently lives with daughter in [place] from [place] secondary to declining health.    Wears seatbelt, wears bike helmet.    - does not exercise daily.    - takes vitamins.    - Smoke alarm in the home. Guns in the home, locked case.   - feels safe in her relationships.       Epworth Sleepiness Scale = 10 (04/04/2015)      Homemaker      Highest level of education:  High school   Social Determinants of Health   Financial Resource Strain: Low Risk  (01/01/2023)   Overall Financial Resource Strain (CARDIA)     Difficulty of Paying Living Expenses: Not hard at all  Food Insecurity: No Food Insecurity (01/01/2023)   Hunger Vital Sign    Worried About Running Out of Food in the Last Year: Never true    Ran Out of Food in the Last Year: Never true  Transportation Needs: No Transportation Needs (01/01/2023)   PRAPARE - Administrator, Civil Service (Medical): No    Lack of Transportation (Non-Medical): No  Physical Activity: Inactive (01/01/2023)   Exercise Vital Sign    Days of Exercise per Week: 0 days    Minutes of Exercise per Session: 0 min  Stress: No Stress Concern Present (01/01/2023)   [employer] of Occupational Health - Occupational Stress Questionnaire    Feeling of Stress : Not at all  Social Connections: Moderately Isolated (01/01/2023)   Social Connection and Isolation Panel [NHANES]    Frequency of Communication with Friends and Family: More than three times a week    Frequency of Social Gatherings with Friends and Family: Once a week    Attends Religious Services: Never    Database administrator or Organizations: No    Attends Engineer, structural: Never    Marital Status: Married    Tobacco Counseling Counseling given: Not Answered   Clinical Intake:  Pre-visit preparation completed: Yes  Pain : No/denies pain     BMI - recorded: 24.21 Nutritional Status: BMI of 19-24  Normal Nutritional Risks: None Diabetes: No  How often do you need to have someone help you when you read instructions, pamphlets, or other written materials from your doctor or pharmacy?: 1 - Never  Interpreter Needed?: No  Information entered by :: Lanier Ensign, LPN   Activities of Daily Living    01/01/2023    1:33 PM  In your present state of health, do you have any difficulty performing the following activities:  Hearing? 0  Vision? 0  Difficulty concentrating or making decisions? 0  Walking or climbing stairs? 0  Dressing or bathing? 0  Doing errands,  shopping? 0  Preparing Food and eating ? N  Using the Toilet? N  In the past six months, have you accidently leaked urine? N  Do you have problems with loss of bowel control? N  Managing your Medications? N  Managing your Finances? N  Housekeeping or managing your Housekeeping? N    Patient Care Team: Natalia Leatherwood, DO as PCP - General (Family Medicine) Rennis Golden Lisette Abu, MD as PCP - Cardiology (Cardiology)  Indicate any recent Medical Services you may have received from other  than [facility] providers in the past year (date may be approximate).     Assessment:   This is a routine wellness examination for Kathy Hobbs.  Hearing/Vision screen Hearing Screening - Comments:: Pt denies any hearing issues  Vision Screening - Comments:: Pt follows up with Dr For annual eye exams   Dietary issues and exercise activities discussed:     Goals Addressed             This Visit's Progress    Patient Stated       Be a little more active        Depression Screen    01/01/2023    1:35 PM 10/02/2022    1:05 PM 12/19/2021    2:04 PM 09/21/2021    1:07 PM 11/29/2020    1:15 PM 01/31/2020   11:26 AM 10/22/2019    1:57 PM  PHQ 2/9 Scores  PHQ - 2 Score 0 2 0 1 0 0 0  PHQ- 9 Score  5  5  0 4    Fall Risk    01/01/2023    1:39 PM 10/02/2022    1:05 PM 12/19/2021    2:08 PM 09/21/2021    1:07 PM 11/29/2020   12:52 PM  Fall Risk   Falls in the past year? 1 0 1 1 1   Number falls in past yr: 1 0 1 0 0  Injury with Fall? 0 0 0 0 0  Risk for fall due to : Impaired vision  Impaired vision    Follow up Falls prevention discussed Falls evaluation completed Falls prevention discussed Falls evaluation completed Falls evaluation completed;Falls prevention discussed    MEDICARE RISK AT HOME:  Medicare Risk at Home - 01/01/23 1340     Any stairs in or around the home? Yes    If so, are there any without handrails? No    Home free of loose throw rugs in walkways, pet beds, electrical cords, etc? Yes     Adequate lighting in your home to reduce risk of falls? Yes    Life alert? No    Use of a cane, walker or w/c? No    Grab bars in the bathroom? Yes    Shower chair or bench in shower? No    Elevated toilet seat or a handicapped toilet? No             TIMED UP AND GO:  Was the test performed?  No    Cognitive Function:        01/01/2023    1:41 PM 12/19/2021    2:11 PM  6CIT Screen  What Year? 0 points 0 points  What month? 0 points 0 points  What time? 0 points 0 points  Count back from 20 0 points 0 points  Months in reverse 0 points 4 points  Repeat phrase 0 points 0 points  Total Score 0 points 4 points    Immunizations Immunization History  Administered Date(s) Administered   Influenza, High Dose Seasonal PF 03/07/2021   Influenza,inj,Quad PF,6+ Mos 04/13/2015   Influenza-Unspecified 01/22/2018   PFIZER(Purple Top)SARS-COV-2 Vaccination 07/05/2019, 07/26/2019, 05/08/2020   Tdap 07/13/2015    TDAP status: Up to date  Flu Vaccine status: Up to date  Pneumococcal vaccine status: Declined,  Education has been provided regarding the importance of this vaccine but patient still declined. Advised may receive this vaccine at local pharmacy or Health Dept. Aware to provide a copy of the vaccination record if obtained from  local pharmacy or Health Dept. Verbalized acceptance and understanding.   Covid-19 vaccine status: Declined, Education has been provided regarding the importance of this vaccine but patient still declined. Advised may receive this vaccine at local pharmacy or Health Dept.or vaccine clinic. Aware to provide a copy of the vaccination record if obtained from local pharmacy or Health Dept. Verbalized acceptance and understanding.  Qualifies for Shingles Vaccine? No    Screening Tests Health Maintenance  Topic Date Due   Pneumonia Vaccine 30+ Years old (1 of 1 - PCV) 10/02/2023 (Originally 10/16/2006)   INFLUENZA VACCINE  01/02/2023   Medicare Annual  Wellness (AWV)  01/01/2024   DTaP/Tdap/Td (2 - Td or Tdap) 07/12/2025   DEXA SCAN  Completed   HPV VACCINES  Aged Out   COVID-19 Vaccine  Discontinued   Zoster Vaccines- Shingrix  Discontinued    Health Maintenance  There are no preventive care reminders to display for this patient.   Colorectal cancer screening: No longer required.   Mammogram status: No longer required due to age .  Bone Density status: Completed 04/17/15. Results reflect: Bone density results: OSTEOPOROSIS. Repeat every 2 years.   Additional Screening:   Vision Screening: Recommended annual ophthalmology exams for early detection of glaucoma and other disorders of the eye. Is the patient up to date with their annual eye exam?  Yes  Who is the provider or what is the name of the office in which the patient attends annual eye exams? Followsup with provider  If pt is not established with a provider, would they like to be referred to a provider to establish care? No .   Dental Screening: Recommended annual dental exams for proper oral hygiene   Community Resource Referral / Chronic Care Management: CRR required this visit?  No   CCM required this visit?  No     Plan:     I have personally reviewed and noted the following in the patient's chart:   Medical and social history Use of alcohol, tobacco or illicit drugs  Current medications and supplements including opioid prescriptions. Patient is not currently taking opioid prescriptions. Functional ability and status Nutritional status Physical activity Advanced directives List of other physicians Hospitalizations, surgeries, and ER visits in previous 12 months Vitals Screenings to include cognitive, depression, and falls Referrals and appointments  In addition, I have reviewed and discussed with patient certain preventive protocols, quality metrics, and best practice recommendations. A written personalized care plan for preventive services as well  as general preventive health recommendations were provided to patient.     Marzella Schlein, LPN   8/84/1660   After Visit Summary: (Declined) Due to this being a telephonic visit, with patients personalized plan was offered to patient but patient Declined AVS at this time   Nurse Notes: none

## 2023-01-01 NOTE — Patient Instructions (Signed)
Kathy Hobbs , Thank you for taking time to come for your Medicare Wellness Visit. I appreciate your ongoing commitment to your health goals. Please review the following plan we discussed and let me know if I can assist you in the future.   Referrals/Orders/Follow-Ups/Clinician Recommendations: be more active   This is a list of the screening recommended for you and due dates:  Health Maintenance  Topic Date Due   Pneumonia Vaccine (1 of 1 - PCV) 10/02/2023*   Flu Shot  01/02/2023   Medicare Annual Wellness Visit  01/01/2024   DTaP/Tdap/Td vaccine (2 - Td or Tdap) 07/12/2025   DEXA scan (bone density measurement)  Completed   HPV Vaccine  Aged Out   COVID-19 Vaccine  Discontinued   Zoster (Shingles) Vaccine  Discontinued  *Topic was postponed. The date shown is not the original due date.    Advanced directives: (Declined) Advance directive discussed with you today. Even though you declined this today, please call our office should you change your mind, and we can give you the proper paperwork for you to fill out.  Next Medicare Annual Wellness Visit scheduled for next year: Yes  Preventive Care 35 Years and Older, Female Preventive care refers to lifestyle choices and visits with your health care provider that can promote health and wellness. What does preventive care include? A yearly physical exam. This is also called an annual well check. Dental exams once or twice a year. Routine eye exams. Ask your health care provider how often you should have your eyes checked. Personal lifestyle choices, including: Daily care of your teeth and gums. Regular physical activity. Eating a healthy diet. Avoiding tobacco and drug use. Limiting alcohol use. Practicing safe sex. Taking low-dose aspirin every day. Taking vitamin and mineral supplements as recommended by your health care provider. What happens during an annual well check? The services and screenings done by your health care  provider during your annual well check will depend on your age, overall health, lifestyle risk factors, and family history of disease. Counseling  Your health care provider may ask you questions about your: Alcohol use. Tobacco use. Drug use. Emotional well-being. Home and relationship well-being. Sexual activity. Eating habits. History of falls. Memory and ability to understand (cognition). Work and work Astronomer. Reproductive health. Screening  You may have the following tests or measurements: Height, weight, and BMI. Blood pressure. Lipid and cholesterol levels. These may be checked every 5 years, or more frequently if you are over 11 years old. Skin check. Lung cancer screening. You may have this screening every year starting at age 43 if you have a 30-pack-year history of smoking and currently smoke or have quit within the past 15 years. Fecal occult blood test (FOBT) of the stool. You may have this test every year starting at age 40. Flexible sigmoidoscopy or colonoscopy. You may have a sigmoidoscopy every 5 years or a colonoscopy every 10 years starting at age 75. Hepatitis C blood test. Hepatitis B blood test. Sexually transmitted disease (STD) testing. Diabetes screening. This is done by checking your blood sugar (glucose) after you have not eaten for a while (fasting). You may have this done every 1-3 years. Bone density scan. This is done to screen for osteoporosis. You may have this done starting at age 62. Mammogram. This may be done every 1-2 years. Talk to your health care provider about how often you should have regular mammograms. Talk with your health care provider about your test results, treatment options,  and if necessary, the need for more tests. Vaccines  Your health care provider may recommend certain vaccines, such as: Influenza vaccine. This is recommended every year. Tetanus, diphtheria, and acellular pertussis (Tdap, Td) vaccine. You may need a Td  booster every 10 years. Zoster vaccine. You may need this after age 31. Pneumococcal 13-valent conjugate (PCV13) vaccine. One dose is recommended after age 77. Pneumococcal polysaccharide (PPSV23) vaccine. One dose is recommended after age 6. Talk to your health care provider about which screenings and vaccines you need and how often you need them. This information is not intended to replace advice given to you by your health care provider. Make sure you discuss any questions you have with your health care provider. Document Released: 06/16/2015 Document Revised: 02/07/2016 Document Reviewed: 03/21/2015 Elsevier Interactive Patient Education  2017 [employer].  Fall Prevention in the Home Falls can cause injuries. They can happen to people of all ages. There are many things you can do to make your home safe and to help prevent falls. What can I do on the outside of my home? Regularly fix the edges of walkways and driveways and fix any cracks. Remove anything that might make you trip as you walk through a door, such as a raised step or threshold. Trim any bushes or trees on the path to your home. Use bright outdoor lighting. Clear any walking paths of anything that might make someone trip, such as rocks or tools. Regularly check to see if handrails are loose or broken. Make sure that both sides of any steps have handrails. Any raised decks and porches should have guardrails on the edges. Have any leaves, snow, or ice cleared regularly. Use sand or salt on walking paths during winter. Clean up any spills in your garage right away. This includes oil or grease spills. What can I do in the bathroom? Use night lights. Install grab bars by the toilet and in the tub and shower. Do not use towel bars as grab bars. Use non-skid mats or decals in the tub or shower. If you need to sit down in the shower, use a plastic, non-slip stool. Keep the floor dry. Clean up any water that spills on the floor  as soon as it happens. Remove soap buildup in the tub or shower regularly. Attach bath mats securely with double-sided non-slip rug tape. Do not have throw rugs and other things on the floor that can make you trip. What can I do in the bedroom? Use night lights. Make sure that you have a light by your bed that is easy to reach. Do not use any sheets or blankets that are too big for your bed. They should not hang down onto the floor. Have a firm chair that has side arms. You can use this for support while you get dressed. Do not have throw rugs and other things on the floor that can make you trip. What can I do in the kitchen? Clean up any spills right away. Avoid walking on wet floors. Keep items that you use a lot in easy-to-reach places. If you need to reach something above you, use a strong step stool that has a grab bar. Keep electrical cords out of the way. Do not use floor polish or wax that makes floors slippery. If you must use wax, use non-skid floor wax. Do not have throw rugs and other things on the floor that can make you trip. What can I do with my stairs? Do not leave  any items on the stairs. Make sure that there are handrails on both sides of the stairs and use them. Fix handrails that are broken or loose. Make sure that handrails are as long as the stairways. Check any carpeting to make sure that it is firmly attached to the stairs. Fix any carpet that is loose or worn. Avoid having throw rugs at the top or bottom of the stairs. If you do have throw rugs, attach them to the floor with carpet tape. Make sure that you have a light switch at the top of the stairs and the bottom of the stairs. If you do not have them, ask someone to add them for you. What else can I do to help prevent falls? Wear shoes that: Do not have high heels. Have rubber bottoms. Are comfortable and fit you well. Are closed at the toe. Do not wear sandals. If you use a stepladder: Make sure that it is  fully opened. Do not climb a closed stepladder. Make sure that both sides of the stepladder are locked into place. Ask someone to hold it for you, if possible. Clearly mark and make sure that you can see: Any grab bars or handrails. First and last steps. Where the edge of each step is. Use tools that help you move around (mobility aids) if they are needed. These include: Canes. Walkers. Scooters. Crutches. Turn on the lights when you go into a dark area. Replace any light bulbs as soon as they burn out. Set up your furniture so you have a clear path. Avoid moving your furniture around. If any of your floors are uneven, fix them. If there are any pets around you, be aware of where they are. Review your medicines with your doctor. Some medicines can make you feel dizzy. This can increase your chance of falling. Ask your doctor what other things that you can do to help prevent falls. This information is not intended to replace advice given to you by your health care provider. Make sure you discuss any questions you have with your health care provider. Document Released: 03/16/2009 Document Revised: 10/26/2015 Document Reviewed: 06/24/2014 Elsevier Interactive Patient Education  2017 [employer].

## 2023-01-23 ENCOUNTER — Ambulatory Visit: Payer: Medicare Other | Attending: Cardiovascular Disease

## 2023-01-23 DIAGNOSIS — Z7901 Long term (current) use of anticoagulants: Secondary | ICD-10-CM

## 2023-01-23 DIAGNOSIS — I4891 Unspecified atrial fibrillation: Secondary | ICD-10-CM

## 2023-01-23 LAB — POCT INR: INR: 2.9 (ref 2.0–3.0)

## 2023-01-23 NOTE — Patient Instructions (Signed)
Description   Continue taking warfarin 1/2 tablet daily except 1 tablet on Wednesdays.  Recheck INR in 6 weeks.  Coumadin Clinic [phone].     

## 2023-02-11 ENCOUNTER — Other Ambulatory Visit: Payer: Self-pay

## 2023-02-11 DIAGNOSIS — Z5181 Encounter for therapeutic drug level monitoring: Secondary | ICD-10-CM

## 2023-02-11 DIAGNOSIS — I4891 Unspecified atrial fibrillation: Secondary | ICD-10-CM

## 2023-02-11 MED ORDER — WARFARIN SODIUM 2 MG PO TABS
ORAL_TABLET | ORAL | 0 refills | Status: DC
Start: 2023-02-11 — End: 2023-06-18

## 2023-02-11 NOTE — Telephone Encounter (Signed)
Prescription refill request received for warfarin [place]: 09/24/22 ([place])  Next INR check: 03/06/23 Warfarin tablet strength: 2mg   Appropriate dose. Refill sent.

## 2023-02-18 ENCOUNTER — Telehealth: Payer: Self-pay | Admitting: *Deleted

## 2023-02-18 DIAGNOSIS — Z5181 Encounter for therapeutic drug level monitoring: Secondary | ICD-10-CM

## 2023-02-18 NOTE — Telephone Encounter (Signed)
Pt called to cancel coumadin appt for 03/06/2023 because she will be down in [place].  Pt stays down in [place] for a couple of months out of the year. Informed pt to go to the lab corp she usually goes to in [place] and have INR checked on 03/06/2023. Standing order placed for pt/inr. Informed pt to call us for any questions.

## 2023-03-06 ENCOUNTER — Telehealth: Payer: Self-pay

## 2023-03-06 NOTE — Telephone Encounter (Signed)
INR due. Called pt to determine if INR was completed today at [facility]. Pt states she went to lab; however, they require an appt and she scheduled a lab appt for Wednesday, 03/12/23.

## 2023-03-14 ENCOUNTER — Telehealth: Payer: Self-pay | Admitting: *Deleted
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
## 2023-03-20 ENCOUNTER — Telehealth: Payer: Self-pay | Admitting: *Deleted

## 2023-03-20 NOTE — Telephone Encounter (Signed)
Called pt since her INR monitoring is overdue. Spoke with her and she states they canceled all lab appts and she will be able to go this upcoming week on 03/25/23 at 130pm. She states they have faxed it to [place] in the past and hope they still do. She is aware that we will need this INR result so we can ensure her warfarin/INR continues to be monitored appropriately.

## 2023-03-26 ENCOUNTER — Ambulatory Visit (INDEPENDENT_AMBULATORY_CARE_PROVIDER_SITE_OTHER): Payer: Medicare Other | Admitting: *Deleted

## 2023-03-26 DIAGNOSIS — I4891 Unspecified atrial fibrillation: Secondary | ICD-10-CM

## 2023-03-26 DIAGNOSIS — D6869 Other thrombophilia: Secondary | ICD-10-CM | POA: Diagnosis not present

## 2023-03-26 DIAGNOSIS — Z5181 Encounter for therapeutic drug level monitoring: Secondary | ICD-10-CM

## 2023-03-26 LAB — PROTIME-INR
INR: 2.1 — ABNORMAL HIGH (ref 0.9–1.2)
Prothrombin Time: 21.9 s — ABNORMAL HIGH (ref 9.1–12.0)

## 2023-03-26 NOTE — Patient Instructions (Addendum)
Description   Spoke with pt and advised to continue taking warfarin 1/2 tablet daily except 1 tablet on Wednesdays. Recheck INR in 6 weeks. [facility] [phone] If pt is going to reside in [place], she will need to set up management with her doctor in [place].

## 2023-03-27 ENCOUNTER — Telehealth: Payer: Self-pay | Admitting: *Deleted

## 2023-03-27 NOTE — Telephone Encounter (Signed)
Pt's husband (on [place]) called and stated his wife did not get all the instructions on yesterday from INR/warfarin. Advised that she is to continue normal warfarin dose, which is, 2mg  tablet and take 1/2 tablet daily except 1 tablet on Wednesdays. He states she did not know when to have it redone and advised on 05/06/23 at the lab while in [place]. Discussed if they plan to permanently reside there she will need to get a physician there and husband states they have one for her on 06/07/22.

## 2023-05-07 ENCOUNTER — Ambulatory Visit (INDEPENDENT_AMBULATORY_CARE_PROVIDER_SITE_OTHER): Payer: Medicare Other

## 2023-05-07 DIAGNOSIS — Z5181 Encounter for therapeutic drug level monitoring: Secondary | ICD-10-CM

## 2023-05-07 LAB — PROTIME-INR
INR: 1.9 — ABNORMAL HIGH (ref 0.9–1.2)
Prothrombin Time: 20.7 s — ABNORMAL HIGH (ref 9.1–12.0)

## 2023-05-07 NOTE — Patient Instructions (Signed)
Description   Spoke with pt and advised to take 1.5 tablets today and then continue taking warfarin 1/2 tablet daily except 1 tablet on Wednesdays.  Recheck INR in 6 weeks.  [facility] [phone] If pt is going to reside in [place], pt has a scheduled appt with new Cardiologist in [place] on 06/05/23 (Dr Nedra Hai)

## 2023-06-18 ENCOUNTER — Other Ambulatory Visit: Payer: Self-pay | Admitting: Internal Medicine

## 2023-06-18 DIAGNOSIS — Z5181 Encounter for therapeutic drug level monitoring: Secondary | ICD-10-CM

## 2023-06-18 DIAGNOSIS — I4891 Unspecified atrial fibrillation: Secondary | ICD-10-CM

## 2023-11-01 ENCOUNTER — Other Ambulatory Visit: Payer: Self-pay | Admitting: Internal Medicine

## 2023-11-01 DIAGNOSIS — E785 Hyperlipidemia, unspecified: Secondary | ICD-10-CM

## 2024-02-12 ENCOUNTER — Ambulatory Visit: Admitting: Internal Medicine
